# Patient Record
Sex: Female | Born: 1955
Health system: Southern US, Community
[De-identification: ages and names within clinical notes are randomized; demographics above are authoritative.]

## PROBLEM LIST (undated history)

## (undated) DIAGNOSIS — G47419 Narcolepsy without cataplexy: Secondary | ICD-10-CM

## (undated) DIAGNOSIS — M1990 Unspecified osteoarthritis, unspecified site: Secondary | ICD-10-CM

## (undated) DIAGNOSIS — G459 Transient cerebral ischemic attack, unspecified: Secondary | ICD-10-CM

## (undated) DIAGNOSIS — R51 Headache: Secondary | ICD-10-CM

## (undated) DIAGNOSIS — I6529 Occlusion and stenosis of unspecified carotid artery: Secondary | ICD-10-CM

## (undated) DIAGNOSIS — I2699 Other pulmonary embolism without acute cor pulmonale: Secondary | ICD-10-CM

## (undated) DIAGNOSIS — E559 Vitamin D deficiency, unspecified: Secondary | ICD-10-CM

## (undated) DIAGNOSIS — I6521 Occlusion and stenosis of right carotid artery: Secondary | ICD-10-CM

## (undated) DIAGNOSIS — K219 Gastro-esophageal reflux disease without esophagitis: Secondary | ICD-10-CM

## (undated) DIAGNOSIS — G629 Polyneuropathy, unspecified: Secondary | ICD-10-CM

## (undated) DIAGNOSIS — J449 Chronic obstructive pulmonary disease, unspecified: Secondary | ICD-10-CM

## (undated) DIAGNOSIS — G4730 Sleep apnea, unspecified: Secondary | ICD-10-CM

## (undated) DIAGNOSIS — F32A Depression, unspecified: Secondary | ICD-10-CM

## (undated) DIAGNOSIS — M5030 Other cervical disc degeneration, unspecified cervical region: Secondary | ICD-10-CM

## (undated) DIAGNOSIS — F419 Anxiety disorder, unspecified: Secondary | ICD-10-CM

## (undated) DIAGNOSIS — E785 Hyperlipidemia, unspecified: Secondary | ICD-10-CM

## (undated) DIAGNOSIS — Z8709 Personal history of other diseases of the respiratory system: Secondary | ICD-10-CM

## (undated) DIAGNOSIS — F329 Major depressive disorder, single episode, unspecified: Secondary | ICD-10-CM

## (undated) DIAGNOSIS — I2510 Atherosclerotic heart disease of native coronary artery without angina pectoris: Secondary | ICD-10-CM

## (undated) DIAGNOSIS — J45909 Unspecified asthma, uncomplicated: Secondary | ICD-10-CM

## (undated) DIAGNOSIS — R600 Localized edema: Secondary | ICD-10-CM

## (undated) DIAGNOSIS — Z85118 Personal history of other malignant neoplasm of bronchus and lung: Secondary | ICD-10-CM

## (undated) DIAGNOSIS — G8929 Other chronic pain: Secondary | ICD-10-CM

## (undated) DIAGNOSIS — E079 Disorder of thyroid, unspecified: Secondary | ICD-10-CM

## (undated) DIAGNOSIS — E119 Type 2 diabetes mellitus without complications: Secondary | ICD-10-CM

## (undated) DIAGNOSIS — I999 Unspecified disorder of circulatory system: Secondary | ICD-10-CM

## (undated) DIAGNOSIS — R200 Anesthesia of skin: Secondary | ICD-10-CM

## (undated) DIAGNOSIS — R519 Headache, unspecified: Secondary | ICD-10-CM

## (undated) DIAGNOSIS — R202 Paresthesia of skin: Secondary | ICD-10-CM

## (undated) DIAGNOSIS — M5136 Other intervertebral disc degeneration, lumbar region: Secondary | ICD-10-CM

## (undated) DIAGNOSIS — D649 Anemia, unspecified: Secondary | ICD-10-CM

## (undated) DIAGNOSIS — I10 Essential (primary) hypertension: Secondary | ICD-10-CM

## (undated) DIAGNOSIS — I219 Acute myocardial infarction, unspecified: Secondary | ICD-10-CM

## (undated) HISTORY — PX: CARDIAC CATHETERIZATION: SHX172

## (undated) HISTORY — DX: Unspecified asthma, uncomplicated: J45.909

## (undated) HISTORY — PX: LAPAROSCOPIC LYSIS OF ADHESIONS: SHX5905

## (undated) HISTORY — PX: HERNIA REPAIR: SHX51

## (undated) HISTORY — DX: Occlusion and stenosis of right carotid artery: I65.21

## (undated) HISTORY — DX: Type 2 diabetes mellitus without complications: E11.9

## (undated) HISTORY — DX: Anxiety disorder, unspecified: F41.9

## (undated) HISTORY — DX: Disorder of thyroid, unspecified: E07.9

## (undated) HISTORY — PX: TUMOR REMOVAL: SHX12

## (undated) HISTORY — PX: BACK SURGERY: SHX140

## (undated) HISTORY — PX: TONSILLECTOMY: SUR1361

## (undated) HISTORY — PX: KNEE SURGERY: SHX244

## (undated) HISTORY — PX: EYE SURGERY: SHX253

## (undated) HISTORY — PX: ANKLE SURGERY: SHX546

## (undated) HISTORY — DX: Other chronic pain: G89.29

## (undated) HISTORY — PX: CHOLECYSTECTOMY: SHX55

## (undated) HISTORY — PX: TEMPOROMANDIBULAR JOINT SURGERY: SHX35

## (undated) HISTORY — PX: NECK SURGERY: SHX720

## (undated) HISTORY — PX: JOINT REPLACEMENT: SHX530

## (undated) HISTORY — PX: CAROTID ENDARTERECTOMY: SUR193

## (undated) HISTORY — PX: ROTATOR CUFF REPAIR: SHX139

## (undated) HISTORY — PX: LUNG BIOPSY: SHX232

## (undated) HISTORY — PX: NISSEN FUNDOPLICATION: SHX2091

## (undated) HISTORY — PX: BLADDER SURGERY: SHX569

## (undated) HISTORY — PX: FEMUR FRACTURE SURGERY: SHX633

## (undated) HISTORY — DX: Narcolepsy without cataplexy: G47.419

## (undated) HISTORY — PX: TUBAL LIGATION: SHX77

## (undated) HISTORY — PX: CARPAL TUNNEL RELEASE: SHX101

## (undated) HISTORY — PX: ABDOMINAL HYSTERECTOMY: SHX81

## (undated) HISTORY — PX: FOOT SURGERY: SHX648

## (undated) HISTORY — DX: Personal history of other malignant neoplasm of bronchus and lung: Z85.118

---

## 1998-02-18 ENCOUNTER — Emergency Department (HOSPITAL_COMMUNITY): Admission: EM | Admit: 1998-02-18 | Discharge: 1998-02-18 | Payer: Self-pay | Admitting: Emergency Medicine

## 2002-06-26 ENCOUNTER — Ambulatory Visit (HOSPITAL_COMMUNITY): Admission: RE | Admit: 2002-06-26 | Discharge: 2002-06-26 | Payer: Self-pay | Admitting: Internal Medicine

## 2002-07-12 ENCOUNTER — Emergency Department (HOSPITAL_COMMUNITY): Admission: EM | Admit: 2002-07-12 | Discharge: 2002-07-12 | Payer: Self-pay | Admitting: Emergency Medicine

## 2003-03-08 ENCOUNTER — Ambulatory Visit (HOSPITAL_COMMUNITY): Admission: RE | Admit: 2003-03-08 | Discharge: 2003-03-09 | Payer: Self-pay | Admitting: Neurosurgery

## 2003-03-08 ENCOUNTER — Encounter: Payer: Self-pay | Admitting: Neurosurgery

## 2003-04-04 ENCOUNTER — Encounter: Admission: RE | Admit: 2003-04-04 | Discharge: 2003-04-04 | Payer: Self-pay | Admitting: Neurosurgery

## 2003-04-04 ENCOUNTER — Encounter: Payer: Self-pay | Admitting: Neurosurgery

## 2003-05-02 ENCOUNTER — Encounter: Payer: Self-pay | Admitting: Neurosurgery

## 2003-05-02 ENCOUNTER — Encounter: Admission: RE | Admit: 2003-05-02 | Discharge: 2003-05-02 | Payer: Self-pay | Admitting: Neurosurgery

## 2003-08-06 ENCOUNTER — Ambulatory Visit (HOSPITAL_COMMUNITY): Admission: RE | Admit: 2003-08-06 | Discharge: 2003-08-06 | Payer: Self-pay | Admitting: Neurosurgery

## 2004-01-23 ENCOUNTER — Ambulatory Visit (HOSPITAL_BASED_OUTPATIENT_CLINIC_OR_DEPARTMENT_OTHER): Admission: RE | Admit: 2004-01-23 | Discharge: 2004-01-23 | Payer: Self-pay | Admitting: Orthopedic Surgery

## 2004-07-24 ENCOUNTER — Ambulatory Visit: Payer: Self-pay | Admitting: Cardiology

## 2004-07-24 ENCOUNTER — Ambulatory Visit (HOSPITAL_COMMUNITY): Admission: RE | Admit: 2004-07-24 | Discharge: 2004-07-24 | Payer: Self-pay | Admitting: Cardiology

## 2006-01-08 ENCOUNTER — Ambulatory Visit: Payer: Self-pay | Admitting: Oncology

## 2007-11-08 ENCOUNTER — Observation Stay (HOSPITAL_COMMUNITY): Admission: RE | Admit: 2007-11-08 | Discharge: 2007-11-09 | Payer: Self-pay | Admitting: Orthopedic Surgery

## 2007-12-27 ENCOUNTER — Encounter: Admission: RE | Admit: 2007-12-27 | Discharge: 2008-02-22 | Payer: Self-pay | Admitting: Orthopedic Surgery

## 2007-12-29 ENCOUNTER — Encounter: Admission: RE | Admit: 2007-12-29 | Discharge: 2007-12-29 | Payer: Self-pay | Admitting: Orthopaedic Surgery

## 2008-02-28 ENCOUNTER — Ambulatory Visit: Payer: Self-pay | Admitting: Cardiovascular Disease

## 2008-02-28 LAB — CONVERTED CEMR LAB
BUN: 13 mg/dL (ref 6–23)
Basophils Absolute: 0 10*3/uL (ref 0.0–0.1)
Basophils Relative: 0 % (ref 0.0–3.0)
CO2: 29 meq/L (ref 19–32)
Calcium: 9.6 mg/dL (ref 8.4–10.5)
Chloride: 103 meq/L (ref 96–112)
Creatinine, Ser: 0.6 mg/dL (ref 0.4–1.2)
Eosinophils Absolute: 0.2 10*3/uL (ref 0.0–0.7)
Eosinophils Relative: 1.8 % (ref 0.0–5.0)
GFR calc Af Amer: 136 mL/min
GFR calc non Af Amer: 112 mL/min
Glucose, Bld: 95 mg/dL (ref 70–99)
HCT: 44.1 % (ref 36.0–46.0)
Hemoglobin: 15.6 g/dL — ABNORMAL HIGH (ref 12.0–15.0)
INR: 0.9 (ref 0.8–1.0)
Lymphocytes Relative: 20.7 % (ref 12.0–46.0)
MCHC: 35.3 g/dL (ref 30.0–36.0)
MCV: 100 fL (ref 78.0–100.0)
Monocytes Absolute: 0.7 10*3/uL (ref 0.1–1.0)
Monocytes Relative: 6.1 % (ref 3.0–12.0)
Neutro Abs: 8 10*3/uL — ABNORMAL HIGH (ref 1.4–7.7)
Neutrophils Relative %: 71.4 % (ref 43.0–77.0)
Platelets: 210 10*3/uL (ref 150–400)
Potassium: 3.8 meq/L (ref 3.5–5.1)
Prothrombin Time: 11 s (ref 10.9–13.3)
RBC: 4.41 M/uL (ref 3.87–5.11)
RDW: 13.4 % (ref 11.5–14.6)
Sodium: 138 meq/L (ref 135–145)
WBC: 11.2 10*3/uL — ABNORMAL HIGH (ref 4.5–10.5)

## 2008-03-02 ENCOUNTER — Inpatient Hospital Stay (HOSPITAL_BASED_OUTPATIENT_CLINIC_OR_DEPARTMENT_OTHER): Admission: RE | Admit: 2008-03-02 | Discharge: 2008-03-02 | Payer: Self-pay | Admitting: Cardiovascular Disease

## 2008-03-02 ENCOUNTER — Ambulatory Visit: Payer: Self-pay | Admitting: Cardiovascular Disease

## 2008-04-04 ENCOUNTER — Ambulatory Visit: Payer: Self-pay | Admitting: Cardiovascular Disease

## 2008-08-21 ENCOUNTER — Ambulatory Visit: Payer: Self-pay | Admitting: Thoracic Surgery

## 2008-09-07 ENCOUNTER — Inpatient Hospital Stay (HOSPITAL_COMMUNITY): Admission: RE | Admit: 2008-09-07 | Discharge: 2008-09-14 | Payer: Self-pay | Admitting: Thoracic Surgery

## 2008-09-07 ENCOUNTER — Ambulatory Visit: Payer: Self-pay | Admitting: Thoracic Surgery

## 2008-09-07 ENCOUNTER — Ambulatory Visit: Payer: Self-pay | Admitting: Pulmonary Disease

## 2008-09-07 ENCOUNTER — Encounter: Payer: Self-pay | Admitting: Thoracic Surgery

## 2008-09-18 ENCOUNTER — Ambulatory Visit: Payer: Self-pay | Admitting: Thoracic Surgery

## 2008-09-18 ENCOUNTER — Encounter: Admission: RE | Admit: 2008-09-18 | Discharge: 2008-09-18 | Payer: Self-pay | Admitting: Thoracic Surgery

## 2008-10-02 ENCOUNTER — Ambulatory Visit: Payer: Self-pay | Admitting: Thoracic Surgery

## 2008-10-02 ENCOUNTER — Encounter: Admission: RE | Admit: 2008-10-02 | Discharge: 2008-10-02 | Payer: Self-pay | Admitting: Thoracic Surgery

## 2008-11-13 ENCOUNTER — Ambulatory Visit: Payer: Self-pay | Admitting: Thoracic Surgery

## 2008-11-13 ENCOUNTER — Encounter: Admission: RE | Admit: 2008-11-13 | Discharge: 2008-11-13 | Payer: Self-pay | Admitting: Thoracic Surgery

## 2009-03-08 ENCOUNTER — Ambulatory Visit: Payer: Self-pay | Admitting: Thoracic Surgery

## 2009-03-27 ENCOUNTER — Encounter (INDEPENDENT_AMBULATORY_CARE_PROVIDER_SITE_OTHER): Payer: Self-pay | Admitting: *Deleted

## 2009-07-08 ENCOUNTER — Encounter: Admission: RE | Admit: 2009-07-08 | Discharge: 2009-07-08 | Payer: Self-pay | Admitting: Orthopaedic Surgery

## 2009-10-23 HISTORY — PX: COLONOSCOPY: SHX174

## 2009-10-25 ENCOUNTER — Ambulatory Visit (HOSPITAL_BASED_OUTPATIENT_CLINIC_OR_DEPARTMENT_OTHER): Admission: RE | Admit: 2009-10-25 | Discharge: 2009-10-25 | Payer: Self-pay | Admitting: Orthopedic Surgery

## 2009-12-04 ENCOUNTER — Encounter: Admission: RE | Admit: 2009-12-04 | Discharge: 2009-12-04 | Payer: Self-pay | Admitting: Orthopedic Surgery

## 2009-12-05 ENCOUNTER — Ambulatory Visit (HOSPITAL_BASED_OUTPATIENT_CLINIC_OR_DEPARTMENT_OTHER): Admission: RE | Admit: 2009-12-05 | Discharge: 2009-12-05 | Payer: Self-pay | Admitting: Orthopedic Surgery

## 2010-01-21 ENCOUNTER — Encounter: Admission: RE | Admit: 2010-01-21 | Discharge: 2010-01-21 | Payer: Self-pay | Admitting: Orthopaedic Surgery

## 2010-01-24 ENCOUNTER — Encounter: Admission: RE | Admit: 2010-01-24 | Discharge: 2010-01-24 | Payer: Self-pay | Admitting: Orthopaedic Surgery

## 2010-03-30 IMAGING — CR DG CHEST 2V
2 series · 2 of 2 positions shown · non-contrast
Comparison: 11/03/2007

CLINICAL DATA: Chest pain, shortness of breath of

CHEST - 2 VIEW

[view not recorded (1 of 2)]
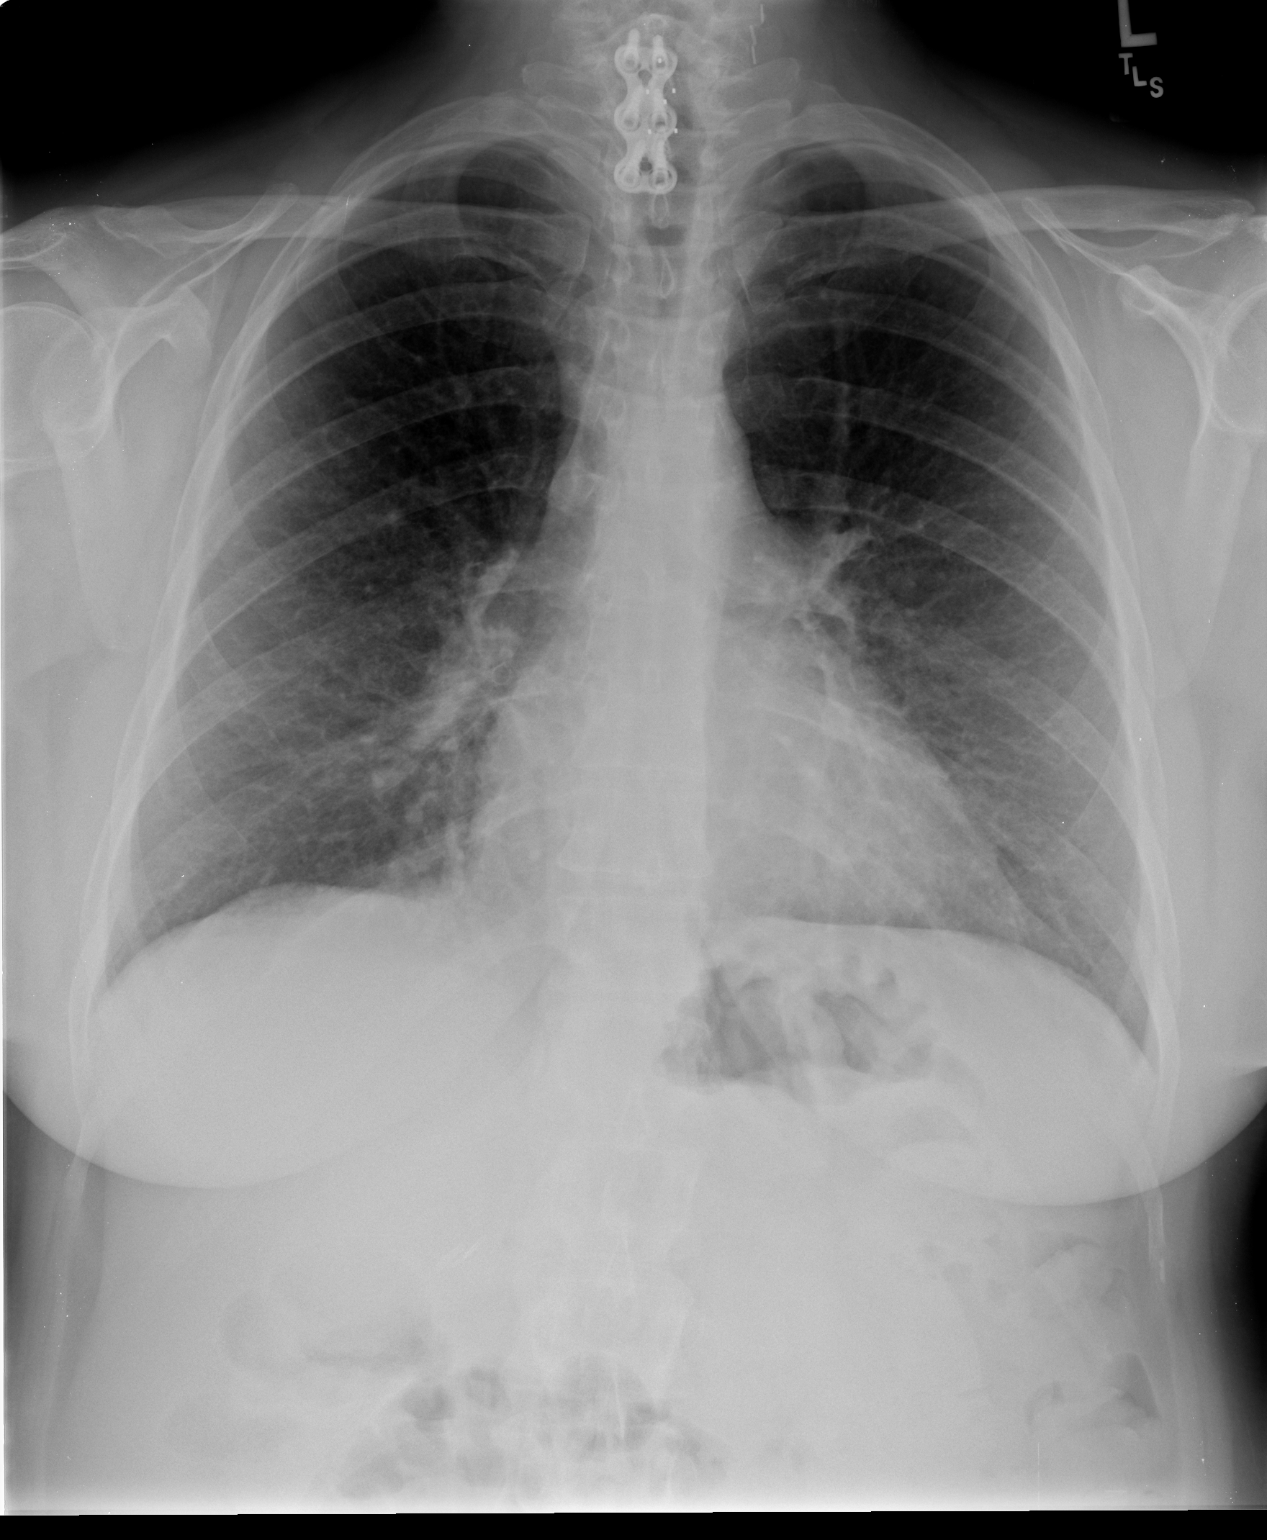

[view not recorded (2 of 2)]
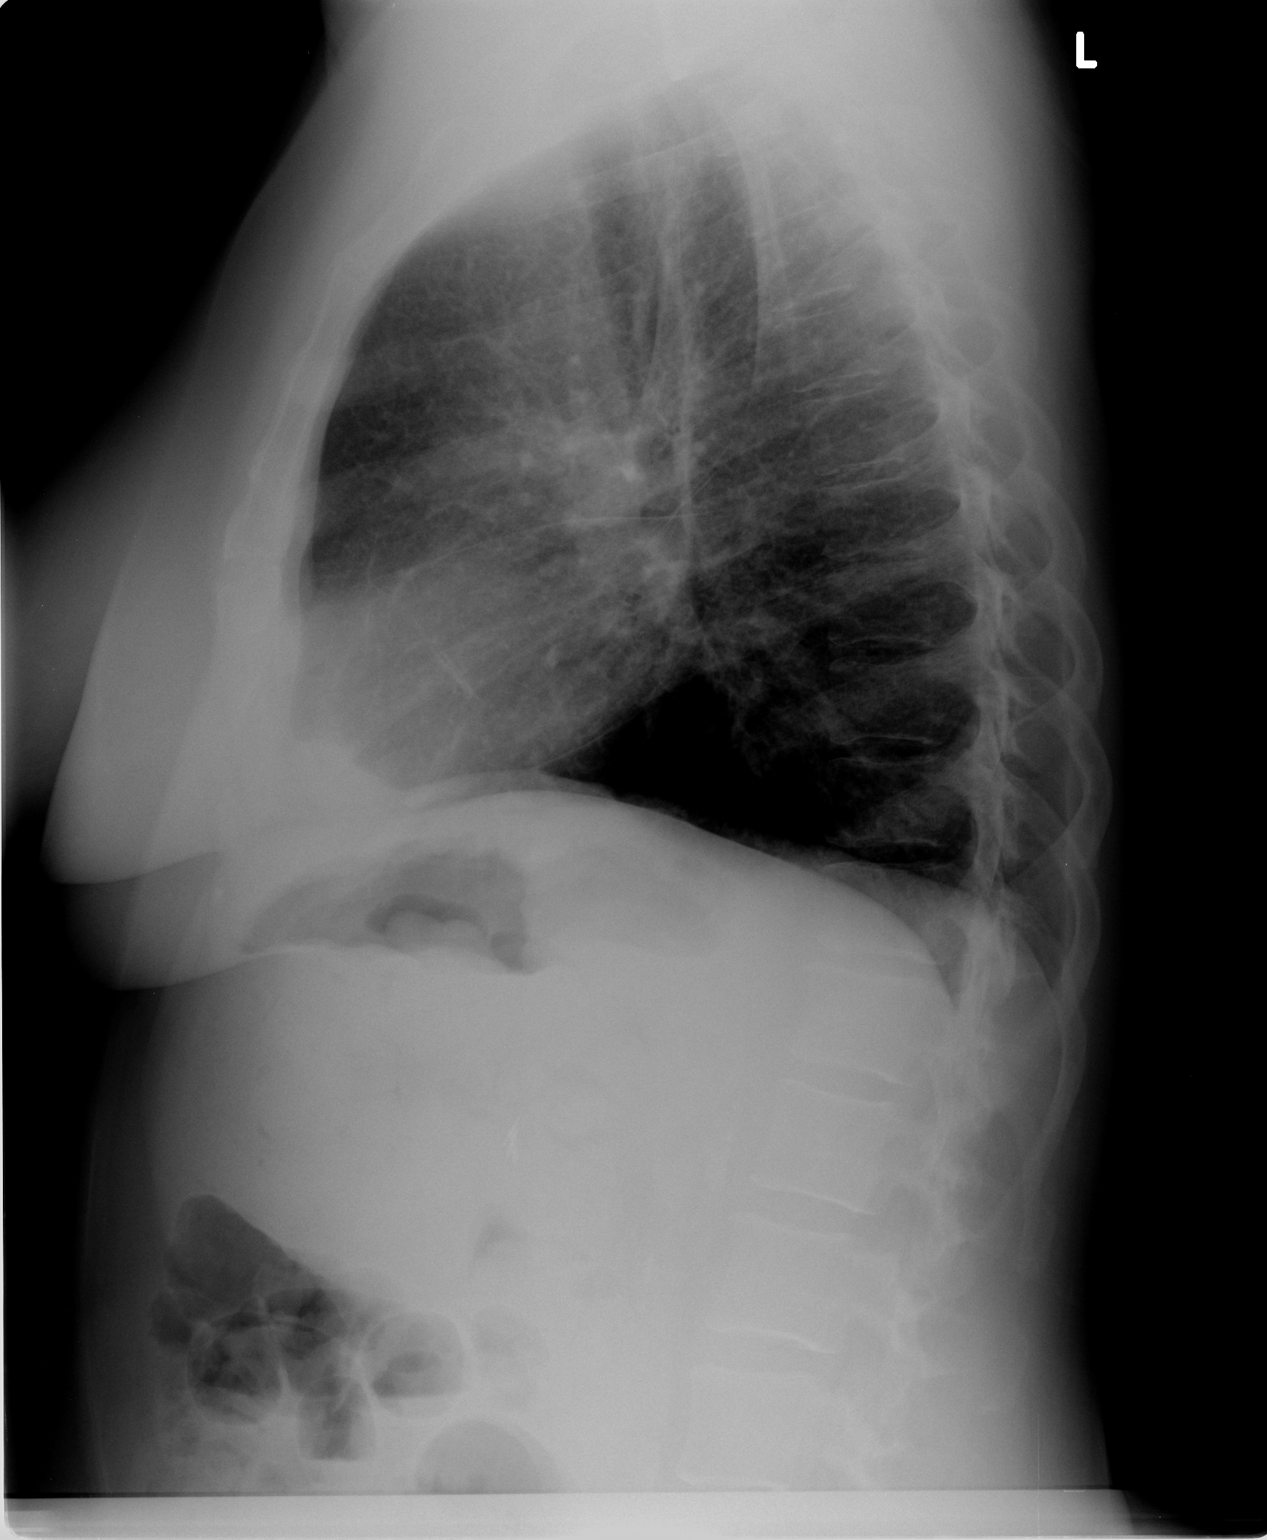

[2 of 2 positions shown; findings below may reference images not displayed]

FINDINGS: Coarse perihilar and bibasilar interstitial markings
largely stable.  No confluent airspace infiltrate.  No effusion.
Vascular clips in the right upper abdomen.  Cervical plate and
graft markers noted.  Heart size upper limits normal.  Visualized
bones unremarkable.
IMPRESSION: Stable interstitial lung disease without acute or superimposed
abnormality.

## 2010-10-28 LAB — POCT I-STAT, CHEM 8
Chloride: 105 mEq/L (ref 96–112)
HCT: 45 % (ref 36.0–46.0)
Hemoglobin: 15.3 g/dL — ABNORMAL HIGH (ref 12.0–15.0)
Potassium: 3.8 mEq/L (ref 3.5–5.1)
Sodium: 143 mEq/L (ref 135–145)

## 2010-10-28 LAB — GLUCOSE, CAPILLARY
Glucose-Capillary: 101 mg/dL — ABNORMAL HIGH (ref 70–99)
Glucose-Capillary: 102 mg/dL — ABNORMAL HIGH (ref 70–99)

## 2010-11-02 LAB — BASIC METABOLIC PANEL
BUN: 10 mg/dL (ref 6–23)
Calcium: 9.1 mg/dL (ref 8.4–10.5)
Chloride: 103 mEq/L (ref 96–112)
Creatinine, Ser: 0.74 mg/dL (ref 0.4–1.2)
GFR calc non Af Amer: >60 mL/min (ref >60)

## 2010-11-02 LAB — GLUCOSE, CAPILLARY: Glucose-Capillary: 97 mg/dL (ref 70–99)

## 2010-11-24 LAB — BASIC METABOLIC PANEL
CO2: 24 mEq/L (ref 19–32)
Chloride: 98 mEq/L (ref 96–112)
Creatinine, Ser: 0.49 mg/dL (ref 0.4–1.2)
GFR calc Af Amer: >60 mL/min (ref >60)
Potassium: 4.4 mEq/L (ref 3.5–5.1)

## 2010-11-24 LAB — GLUCOSE, CAPILLARY
Glucose-Capillary: 103 mg/dL — ABNORMAL HIGH (ref 70–99)
Glucose-Capillary: 120 mg/dL — ABNORMAL HIGH (ref 70–99)
Glucose-Capillary: 134 mg/dL — ABNORMAL HIGH (ref 70–99)
Glucose-Capillary: 143 mg/dL — ABNORMAL HIGH (ref 70–99)
Glucose-Capillary: 195 mg/dL — ABNORMAL HIGH (ref 70–99)

## 2010-11-24 LAB — CBC
HCT: 37.4 % (ref 36.0–46.0)
HCT: 43.8 % (ref 36.0–46.0)
Hemoglobin: 12.5 g/dL (ref 12.0–15.0)
MCHC: 33.4 g/dL (ref 30.0–36.0)
MCHC: 33.8 g/dL (ref 30.0–36.0)
MCV: 100 fL (ref 78.0–100.0)
MCV: 99.1 fL (ref 78.0–100.0)
Platelets: 169 10*3/uL (ref 150–400)
Platelets: 171 10*3/uL (ref 150–400)
RBC: 3.58 MIL/uL — ABNORMAL LOW (ref 3.87–5.11)
RBC: 3.74 MIL/uL — ABNORMAL LOW (ref 3.87–5.11)
WBC: 15.8 10*3/uL — ABNORMAL HIGH (ref 4.0–10.5)
WBC: 6.7 10*3/uL (ref 4.0–10.5)

## 2010-11-24 LAB — BLOOD GAS, ARTERIAL
Acid-Base Excess: 0.1 mmol/L (ref 0.0–2.0)
O2 Saturation: 90.5 %
TCO2: 25.1 mmol/L (ref 0–100)
pCO2 arterial: 36.9 mmHg (ref 35.0–45.0)

## 2010-11-24 LAB — URINE MICROSCOPIC-ADD ON

## 2010-11-24 LAB — TYPE AND SCREEN: ABO/RH(D): A POS

## 2010-11-24 LAB — FUNGUS CULTURE W SMEAR: Fungal Smear: NONE SEEN

## 2010-11-24 LAB — COMPREHENSIVE METABOLIC PANEL
ALT: 23 U/L (ref 0–35)
AST: 28 U/L (ref 0–37)
Albumin: 3 g/dL — ABNORMAL LOW (ref 3.5–5.2)
Albumin: 3.3 g/dL — ABNORMAL LOW (ref 3.5–5.2)
BUN: 9 mg/dL (ref 6–23)
CO2: 21 mEq/L (ref 19–32)
CO2: 28 mEq/L (ref 19–32)
Chloride: 100 mEq/L (ref 96–112)
Chloride: 104 mEq/L (ref 96–112)
Creatinine, Ser: 0.63 mg/dL (ref 0.4–1.2)
GFR calc Af Amer: >60 mL/min (ref >60)
GFR calc non Af Amer: >60 mL/min (ref >60)
GFR calc non Af Amer: >60 mL/min (ref >60)
Potassium: 4.6 mEq/L (ref 3.5–5.1)
Sodium: 138 mEq/L (ref 135–145)
Total Bilirubin: 0.3 mg/dL (ref 0.3–1.2)
Total Bilirubin: 0.7 mg/dL (ref 0.3–1.2)

## 2010-11-24 LAB — ABO/RH: ABO/RH(D): A POS

## 2010-11-24 LAB — AFB CULTURE WITH SMEAR (NOT AT ARMC)

## 2010-11-24 LAB — URINALYSIS, ROUTINE W REFLEX MICROSCOPIC
Bilirubin Urine: NEGATIVE
Ketones, ur: NEGATIVE mg/dL
Nitrite: NEGATIVE
Urobilinogen, UA: 1 mg/dL (ref 0.0–1.0)

## 2010-11-24 LAB — APTT: aPTT: 31 seconds (ref 24–37)

## 2010-11-24 LAB — TISSUE CULTURE

## 2010-11-24 LAB — PROTIME-INR: INR: 1 (ref 0.00–1.49)

## 2010-11-25 LAB — GLUCOSE, CAPILLARY
Glucose-Capillary: 101 mg/dL — ABNORMAL HIGH (ref 70–99)
Glucose-Capillary: 108 mg/dL — ABNORMAL HIGH (ref 70–99)
Glucose-Capillary: 117 mg/dL — ABNORMAL HIGH (ref 70–99)
Glucose-Capillary: 120 mg/dL — ABNORMAL HIGH (ref 70–99)
Glucose-Capillary: 122 mg/dL — ABNORMAL HIGH (ref 70–99)
Glucose-Capillary: 127 mg/dL — ABNORMAL HIGH (ref 70–99)
Glucose-Capillary: 213 mg/dL — ABNORMAL HIGH (ref 70–99)
Glucose-Capillary: 90 mg/dL (ref 70–99)

## 2010-11-25 LAB — COMPREHENSIVE METABOLIC PANEL
AST: 57 U/L — ABNORMAL HIGH (ref 0–37)
Albumin: 2.7 g/dL — ABNORMAL LOW (ref 3.5–5.2)
Calcium: 8.9 mg/dL (ref 8.4–10.5)
Creatinine, Ser: 0.53 mg/dL (ref 0.4–1.2)
GFR calc Af Amer: >60 mL/min (ref >60)
GFR calc non Af Amer: >60 mL/min (ref >60)

## 2010-11-25 LAB — CBC
HCT: 36.4 % (ref 36.0–46.0)
Hemoglobin: 12.1 g/dL (ref 12.0–15.0)
MCHC: 33.3 g/dL (ref 30.0–36.0)
MCHC: 34.3 g/dL (ref 30.0–36.0)
MCV: 100.8 fL — ABNORMAL HIGH (ref 78.0–100.0)
MCV: 98.9 fL (ref 78.0–100.0)
MCV: 99.6 fL (ref 78.0–100.0)
Platelets: 179 10*3/uL (ref 150–400)
Platelets: 217 10*3/uL (ref 150–400)
Platelets: 348 10*3/uL (ref 150–400)
RDW: 13.3 % (ref 11.5–15.5)
RDW: 13.5 % (ref 11.5–15.5)
WBC: 11.4 10*3/uL — ABNORMAL HIGH (ref 4.0–10.5)

## 2010-12-23 NOTE — Assessment & Plan Note (Signed)
OFFICE VISIT   Barbara French, Barbara French  DOB:  Nov 19, 1955                                        October 02, 2008  CHART #:  [id number]   The patient returned today for followup of her lung biopsy.  Her  incisions are healing.  She is still having a moderate amount of pain at  night and is taking pain medication.  Her blood pressure is 120/62,  pulse 85, respirations 18, and sats are 95% and she is off oxygen.  Overall, she is doing much better.  I will plan to see her back again in  6 weeks with a chest x-ray for a final check.   Ines Bloomer, M.D.  Electronically Signed   DPB/MEDQ  D:  10/02/2008  T:  10/02/2008  Job:  [id number]

## 2010-12-23 NOTE — Discharge Summary (Signed)
Barbara French, Barbara French                ACCOUNT NO.:  [id number]   MEDICAL RECORD NO.:  [id number]          PATIENT TYPE:  INP   LOCATION:  2030                         FACILITY:  [facility]   PHYSICIAN:  Ines Bloomer, M.D. DATE OF BIRTH:  July 01, 1956   DATE OF ADMISSION:  09/07/2008  DATE OF DISCHARGE:                               DISCHARGE SUMMARY   FINAL DIAGNOSIS:  Bilateral pulmonary infiltrate, probable interstitial  residual lung disease, awaiting final pathology report.   SECONDARY DIAGNOSES:  1. History of myocardial infarction.  2. Peripheral vascular disease.  3. Sleep apnea.  4. History of Raynaud's.  5. Hypertension.  6. Hypercholesterolemia.  7. Chronic obstructive pulmonary disease.  8. Anxiety/depression.   IN-HOSPITAL OPERATIONS AND PROCEDURES:  Left video-assisted  thoracoscopic surgery with lung biopsy x2.   THE PATIENT'S HISTORY AND PHYSICAL AND HOSPITAL COURSE:  The patient is  a 55 year old female who has had progressive shortness of breath and  underwent bronchoscopy by Dr. Maren Reamer, which was nondiagnostic.  Pulmonary function tests done showed an FVC that was normal position but  diffusion capacity of 43% that corrected to 48%.  The patient does have  a history of myocardial infarction, as well as COPD, hypertension,  hyperlipidemia.  She underwent cardiac workup preoperatively, which was  negative.  The patient continues with progressive shortness of breath at  rest and with exertion.  She denies fever, chills, or excessive sputum.  She was referred to Dr. Edwyna Shell to undergo possible lung biopsy for  definitive diagnosis.  The patient was seen and evaluated by Dr. Edwyna Shell.  Dr. Edwyna Shell discussed with the patient undergoing left VATS with lung  biopsy.  He discussed risks and benefits with the patient.  The patient  acknowledged understanding and agreed to proceed.  Surgery was scheduled  for September 07, 2008.  For details of the patient's past medical  history  and physical exam, please see dictated H&P.   The patient was taken to the operating room on September 07, 2008, where  she underwent left video-assisted thoracoscopic surgery with lung biopsy  x2.  The patient tolerated this procedure well and was transferred to  the intensive care unit in stable condition.  Postoperatively, the  patient was noted to be hemodynamically stable.  She was extubated  following surgery.  Post extubation, she is noted to be alert and  oriented x4 and neuro intact.  Currently, the patient's final pathology  report is still pending.  The patient's postoperative course appeared  much unremarkable.  Pulmonary was consulted for assistance.  Recommended  continue nebulizers and inhalers, reinforced smoking cessation.  They  did follow during the patient's hospital course.  Daily chest x-rays  obtained postoperatively.  No air leak noted from the patient's chest  tube.  Chest x-rays remained stable.  The chest tube was discontinued in  normal fashion.  Followup chest x-ray remained stable.  Postoperatively,  the patient continued to work on her incentive spirometer.  She remains  on oxygen 3-4 liters, maintaining O2 sats 90%.  Postoperatively, the  patient's vital signs were monitored and remained stable.  She remained  afebrile.  Blood pressure stable.  She is in normal sinus rhythm.  Lisinopril was held secondary to blood pressure remaining stable and  persistent cough.  Postoperatively, the patient was out of bed,  ambulating well without difficulty.  She was tolerating diet well.  No  nausea or vomiting noted.  All incisions were noted to be clean, dry,  and intact and healing well.   On September 11, 2008, the patient was noted to be afebrile.  She was in  normal sinus rhythm.  Blood pressure stable.  She is sating 92% on 3  liters.  Most recent lab work showed a white count of 11.4, hemoglobin  of 11.9, hematocrit 34.9, platelet count of 217.  Sodium of  138,  potassium 4.6, chloride of 104, bicarbonate 28, BUN of 5, creatinine  0.59, glucose of 191.  All cultures noted to be negative.  The patient  is tentatively ready for discharge home in the a.m. on September 12, 2008.   FOLLOWUP APPOINTMENTS:  A followup appointment has been arranged with  Dr. Edwyna Shell for September 18, 2008, at 2:30 p.m.  The patient will need to  obtain PA and lateral chest x-ray 30 minutes prior to this appointment.   ACTIVITY:  The patient instructed no driving until released to do so, no  heavy lifting over 10 pounds.  She is told to ambulate 3-4 times per  day, progress as tolerated, and continue her breathing exercises.   INCISIONAL CARE:  The patient is told to shower, washing her incisions  using soap and water.  She is to contact the office if she develops any  drainage or opening from any of her incision site.   DIET:  The patient is educated on diet to be low fat, low salt.   DISCHARGE MEDICATIONS:  1. Lovastatin 80 mg at night.  2. Plavix 75 mg daily.  3. Calcium 500 mg daily.  4. Ventolin HFA inhaler p.r.n.  5. Symbicort 160/4.5 inhaler p.r.n.  6. Zoloft 50 mg p.m.  7. Chantix 1 mg b.i.d.  8. Norvasc 5 mg daily.  9. Lyrica 150 mg b.i.d.  10.Prilosec b.i.d.  11.Singulair 10 mg at night.  12.Albuterol inhaler 2 puffs b.i.d.  13.Nasal spray p.r.n.  14.Potassium chloride 30 mEq daily.  15.MiraLax p.r.n.  16.Clonidine HCl 0.2 mg p.m.  17.Aspirin 325 mg daily.  18.NitroQuick 0.4 mg p.r.n.  19.Flexeril 20 mg p.m.  20.Trazodone 100 mg p.r.n. at night.  21.Advair 250/50 one puff b.i.d.  22.Percocet 5/325 one to two tablets q.4-6 h. P.r.n.        Theda Belfast, [place]      Ines Bloomer, M.D.  Electronically Signed    KMD/MEDQ  D:  09/11/2008  T:  09/12/2008  Job:  [id number]

## 2010-12-23 NOTE — Letter (Signed)
August 21, 2008   Tanvir A. Blenda Nicely, MD  [address]  [place], [place] [address]   Re:  Barbara French, Barbara French                DOB:  10-22-1955   Dear Dr. Blenda Nicely:   I saw the patient in the office today.  This 55 year old patient has had  progressive shortness of breath recently and underwent a bronchoscopy,  which was nondiagnostic.  Her pulmonary function tests are essentially  normal except she has a diffusion capacity of only 43% and that corrects  to 48%.  She has had a cardiac workup, which was apparently negative and  she gets progressive shortness of breath with any type of exertion.  She  has had no fever, chills, excessive sputum, and has had some mild weight  gain.   MEDICATIONS:  Advair Diskus one twice a day, Proventil HFA, Xopenex,  Singulair, prednisone 10 mg a day, Nasonex, Lexapro 20 mg a day,  fexofenadine 180 mg a day, clonidine 0.2 mg daily, Aciphex 20 mg daily,  Benicar 40/12.5 one daily, furosemide 20 mg daily, and potassium.  She  also takes Plavix 75 mg a day as well as aspirin and potassium  supplement.   PAST MEDICAL HISTORY:  She has had a history of a myocardial infarction  and evidence of peripheral vascular disease.  She also was told she have  ulcerative colitis, sleep apnea, and Raynaud phenomenon.  She has  hypertension also.  Apparently, she does not have hypercholesterolemia.   FAMILY HISTORY:  Positive for coronary artery disease, chronic  obstructive pulmonary disease, diabetes mellitus, and breast cancer.   SOCIAL HISTORY:  She is single, has 3 children.  She had quit smoking  several days ago.  Does has 1-3 alcoholic drinks per night.   REVIEW OF SYSTEMS:  Vital Signs:  She is 177 pounds.  She is 5 feet 2  inches.  General:  She has had some weight gain.  Cardiac:  She has  shortness of breath with exertion.  She has had some chest tightness.  No atrial fibrillation.  Pulmonary:  She has had a cough, bronchitis, an  episode of hemoptysis, and  has been wheezing.  GI:  She has had some  dysphagia, abdominal pain, and constipation.  GU:  She has had some  frequent urination.  Vascular:  She has had some claudication.  She had  a TIA twice and a history of phlebitis twice.  Neurologic:  She has had  dizziness and headaches.  No seizures or blackout.  Musculoskeletal:  She has joint pain and muscular pain.  Psychiatric:  She has been  treated for depression or nervousness.  Eye/ENT:  No change in her  eyesight or hearing.  Hematological:  No problems with bleeding,  clotting disorders, or anemia, although she is on Plavix.   PHYSICAL EXAMINATION:  GENERAL:  She is a slightly obese Caucasian  female, in no acute distress.  HEAD, EYES, EARS, NOSE AND THROAT:  Unremarkable.  NECK:  Supple without thyromegaly.  There is no supraclavicular or  axillary adenopathy.  CHEST:  Clear to auscultation and percussion.  HEART:  Regular sinus rhythm.  No murmurs.  I did not hear any wheezes.  ABDOMEN:  Obese.  Bowel sounds are normal.  There is no  hepatosplenomegaly.  EXTREMITIES:  Pulses are 2+.  There is 1+ edema.  No clubbing.  NEUROLOGICAL:  She is oriented x3.  Sensory and motor intact.  Cranial  nerves intact.   IMPRESSION:  I discussed the situation with the patient and recommended  that she had a left video-assisted thoracoscopy and lung biopsy.  We  will plan to do this on September 06, 2008, at [facility].  I have  discussed the risks of procedure and she agrees to surgery.  I  appreciate the opportunity of seeing the patient.   Sincerely.   Ines Bloomer, M.D.  Electronically Signed   DPB/MEDQ  D:  08/21/2008  T:  08/22/2008  Job:  [id number]

## 2010-12-23 NOTE — H&P (Signed)
Barbara French, Barbara French                ACCOUNT NO.:  [id number]   MEDICAL RECORD NO.:  [id number]          PATIENT TYPE:  INP   LOCATION:  NA                           FACILITY:  [facility]   PHYSICIAN:  Ines Bloomer, M.D. DATE OF BIRTH:  22-Jul-1956   DATE OF ADMISSION:  DATE OF DISCHARGE:                              HISTORY & PHYSICAL   CHIEF COMPLAINT:  Shortness of breath.   This 55 year old  patient has had progressive shortness of breath,  underwent bronchoscopy by Dr. Esther Hardy, which was nondiagnostic.  Had  pulmonary function tests done, showed an FVC that was normal but a  diffusion capacity of 43% that corrected to 48%.  Her cardiac workup was  negative.  She gets progressive shortness of breath with any type of  exertion.  She has had no fever, chills, excessive sputum, some mild  hemoptysis .   MEDICATIONS:  Her medications include:  1. Advair Diskus twice daily.  2. Proventil HFA.  3. Xopenex.  4. Singulair.  5. Prednisone 10 mg daily.  6. Nasonex.  7. Lexapro.  8. Fexofenadine 180 mg daily.  9. Clonidine 0.2 daily.  10.Aciphex 20 mg daily.  11.Benicar 40/12.5 one daily.  12.Furosemide 20 daily.  13.Potassium supplement.  14.She also takes Plavix 75 mg a day, aspirin.   PAST MEDICAL HISTORY:  She has a history of myocardial infarction,  peripheral vascular disease, sleep apnea. Raynaud's phenomena,  hypertension and hypercholesterolemia.   FAMILY HISTORY:  Is positive for chronic obstructive pulmonary disease,  diabetes mellitus, breast cancer and coronary artery disease.   SOCIAL HISTORY:  She is single and has three children.  Quit smoking  about 2 weeks ago.  Has one to three alcohol drinks per night.   REVIEW OF SYSTEMS:  Weight is 177 pounds.  She is 5 feet 2 inches.  She  has had some recent weight gain.  CARDIAC:  See history of present  illness and past medical history.  No atrial fibrillation.  PULMONARY:  Has had some bronchitis and an episode of  hemoptysis and then some  wheezing.  GI:  Dysphagia, abdominal pain, constipation.  GU:  Frequent  urination.  VASCULAR:  Claudication, two TIAs and a history of  phlebitis.  NEUROLOGICAL:  She has had some dizziness and headaches, no  seizures or blackouts.  MUSCULOSKELETAL:  Joint pain and muscular pain.  PSYCHIATRIC:  She has been treated for depression and nervousness.  EYES/ENT:  No change in her eyesight or hearing.  HEMATOLOGICAL:  No  problems with bleeding, clotting disorders.  She is pn Plavix.   PHYSICAL EXAMINATION:  She is a slightly obese Caucasian female in no  acute distress.  Blood pressure is 124/64, pulse 96, respirations 22.  Saturations were 95%.  HEAD:  Is atraumatic.  EYES: Pupils equal and reactive to light and  accommodation.  Extraocular movements normal.  EARS: Tympanic membranes  are intact.  NOSE:  There is no septal deviation.  NECK:  Is supple without thyromegaly.  There is no supraclavicular  axillary adenopathy.  CHEST:  There are some bilateral wheezes.  HEART:  Regular sinus rhythm, no murmurs.  ABDOMEN:  Soft.  There is no hepatosplenomegaly.  EXTREMITIES:  Pulses are 2+no edema, clubbing.  NEUROLOGICAL:  She is oriented x3.  Sensory and motor intact.  Cranial  nerves intact.   IMPRESSION:  1. Progressive dyspnea with probable interstitial lung disease.  2. Hypertension.  3. Hypercholesterolemia.  4. History of TIAs.  5,.  History of phlebitis.  6,  History of tobacco abuse.   PLAN:  Left VATS lung biopsy x2.      Ines Bloomer, M.D.  Electronically Signed     DPB/MEDQ  D:  09/06/2008  T:  09/06/2008  Job:  [id number]

## 2010-12-23 NOTE — Letter (Signed)
September 18, 2008   Tanvir A. Blenda Nicely, MD  [address]  [place], [place] [address]   Re:  BAYLEY, YARBOROUGH                DOB:  [birth year]   Dear Dr. Blenda Nicely:   The patient came for followup of her lung biopsy.  We removed her chest  tube sutures.  Her incisions are healing well.  Her biopsy showed severe  case of respiratory bronchiolitis.  She is started on steroids and told  to refrain from smoking.  We sent her home on home oxygen.  Her sats  today were 98% on 3 liters.  Her blood pressure was 100/60, pulse 83,  and respirations 18.  I told her she can start driving today.  We  reduced her oxygen to 2 liters.  She will apparently call you to see  about reducing her steroids.  Chest x-ray today showed that  postoperative change in her left lower lobe, but the rest of her lung  appeared to be stable.  I will plan to see her back in 2 weeks with a  chest x-ray.   Ines Bloomer, M.D.  Electronically Signed   DPB/MEDQ  D:  09/18/2008  T:  09/18/2008  Job:  [id number]

## 2010-12-23 NOTE — Assessment & Plan Note (Signed)
[facility]                            CARDIOLOGY OFFICE NOTE   NAME:Barbara French, Barbara French                       MRN:          [id number]  DATE:02/28/2008                            DOB:          Aug 23, 1955    Mr. Butkiewicz is a 55 year old patient who previously had been seen by Dr.  Andee Lineman.   She is referred by her pulmonologist, Dr. Blenda Nicely for question pulmonary  hypertension.  The patient is a chronic smoker.  She has over a 35-pack-  year history.  She has been having increasing dyspnea.  She has been  having some sharp chest pain as well.  She had a cath in December 2005,  which showed some myocardial bridging and a 70% lesion in diagonal  branch, which was thought to be small and best treated with medication.   She has had a 2-D echocardiogram this June.  Her estimated PA systolic  pressure at least by echo was only 28.   However, because of her persistent dyspnea Dr. Blenda Nicely wanted her  referred for right heart cath to assess PA pressures.   In talking to the patient, she has actually developed significant  vascular disease since 2005.  She has had a left carotid endarterectomy  in [facility].  She also says that she has Raynaud disease and has had  some pain in her legs which sounds like claudication.   Unfortunately, I do not have any of these records in regards to her  lungs.  She apparently has had sleep apnea.  She has been intolerant to  CPAP in the past.  She has tried Chantix and Wellbutrin to stop smoking  without any effect.  She has down to half pack a day reading through Dr.  Feliz Beam notes that appears that he has counseled her quite a bit  regarding the long-term risks and tried fairly vigorously to get her to  stop smoking.   The patient's chest pain is atypical.  It is sharp.  It cannot be  exertional, but it can be at rest and has a minor pleuritic component.  There is no cough, no fever, no sputum production.  She denies  history  of abnormal chest x-ray.  She says she has had this pain ever since her  last heart cath.   It is not particularly worsen.   REVIEW OF SYSTEMS:  Otherwise negative.   PAST MEDICAL HISTORY:  Remarkable for multiple cervical effusions,  previous left CEA in 2008 in [facility], history of anxiety, depression,  history of sleep apnea, chronic smoking and COPD, history of  hypertension.   ALLERGIES:  CODEINE and CORTISONE shots.   FAMILY HISTORY:  Noncontributory.   The patient is divorced.  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  She has 3 older kids.  She is sedentary and pretty much waters her  gardens, watches TV and smokes   She does not drink.   PREVIOUS SURGICAL HISTORY:  Also includes previous left rotator cuff  surgery by Dr. Thurston Hole as recently as March 2009.   CURRENT MEDICATIONS:  1.  Klor-Con 10 a day.  2. Meprozine 50/25.  3. Lasix 40 b.i.d.  4. Clonidine 0.1 a day.  5. Methocarbamol 500 t.i.d.  6. An aspirin.  7. Cyclobenzaprine 10 a day.  8. Lisinopril 20/12.5.  9. Lovastatin 80 a day.  10.Plavix 75 a day.  11.Calcium.  12.Symbicort 160/4.5.   PHYSICAL EXAMINATION:  GENERAL:  Remarkable for a bronchitic female in  no distress.  VITAL SIGNS:  Her blood pressure is 130/80, pulse is 80 and regular,  respiratory rate 14, afebrile.  HEENT:  Unremarkable.  She has a left CEA scar and she has an anterior  cervical fusion scar on the lower right part of her neck.  NECK:  There is no JVP elevation.  No evidence of V-wave.  LUNGS:  Clear.  No active wheezing.  Good diaphragmatic motion.  S1 and  S2.  No RV heave.  No significant murmur.  PMI normal.  ABDOMEN:  Benign.  Bowel sounds positive.  No AAA.  No tenderness.  No  bruit.  No hepatosplenomegaly.  No hepatojugular reflux.  No tenderness.  EXTREMITIES:  Distal pulses are intact. No evidence of PVD.  No lower  extremity edema.  NEURO:  Nonfocal.  SKIN:  Warm and dry.  No muscular weakness.    Her electrocardiogram is normal showing a sinus rhythm and no previous  infarct.   IMPRESSION:  1. Chest pain, history of vascular disease, previous 70% diagonal      lesion with small vessels, left heart cath also indicated.      Continue aspirin and Plavix.  2. Dyspnea, likely secondary to chronic obstructive pulmonary disease      and smoking.  No evidence of pulmonary hypertension by echo.      History of sleep apnea.  Follow up right heart cath to assess      pulmonary artery pressures.  3. Smoking cessation per Dr. Blenda Nicely.  The patient down to half a pack      a day.  However, it seems like it will be difficult for her to get      rid of this.  In regards to her chronic obstructive pulmonary      disease, she will continue her Symbicort.  4. Previous left carotid endarterectomy.  Follow up [facility], she should have yearly duplex studies.  5. Question history of Raynaud disease.  Avoid beta-blocker if      possible.  She does not have fixed  peripheral vascular disease in      the lower extremities.  Clearly her Raynaud is exacerbated by her      smoking.  We will avoid extreme temperatures.  6. Hypertension, currently well controlled.  Continue clonidine.  7. Anxiety, depression.  Follow up with previous psychologist.      Continue Meprozine and methocarbamol.  8. Hypercholesterolemia in the setting of vascular disease.  Continue      lovastatin.  Lipid and liver profile per primary care MD.   Further recommendations will be based on the results of her right and  left heart cath.     Noralyn Pick. Eden Emms, MD, [facility]  Electronically Signed    PCN/MedQ  DD: 02/28/2008  DT: 02/29/2008  Job #: [id number]

## 2010-12-23 NOTE — Op Note (Signed)
Barbara French, Barbara French                ACCOUNT NO.:  [id number]   MEDICAL RECORD NO.:  [id number]          PATIENT TYPE:  INP   LOCATION:  2312                         FACILITY:  [facility]   PHYSICIAN:  Ines Bloomer, M.D. DATE OF BIRTH:  16-Sep-1955   DATE OF PROCEDURE:  09/07/2008  DATE OF DISCHARGE:                               OPERATIVE REPORT   PREOPERATIVE DIAGNOSIS:  Bilateral pulmonary infiltrates, probable  interstitial lung disease.   POSTOPERATIVE DIAGNOSIS:  Bilateral pulmonary infiltrates, probable  interstitial lung disease.   OPERATION PERFORMED:  Left video-assisted thoracoscopy lung biopsy x2.   SURGEON:  Ines Bloomer, MD   FIRST ASSISTANT:  Doree Fudge, PA-C   ANESTHESIA:  General anesthesia.   PROCEDURE IN DETAIL:  After percutaneous insertion of all monitoring  lines, the patient underwent general anesthesia.  She was prepped and  draped in the usual sterile manner.  She was turned to the left lateral  thoracotomy position.  A dual-lumen tube was inserted.  The left lung  was deflated.  Two trocar sites were made in the anterior and posterior  axillary line at the seventh intercostal space and two 10-mm trocars  were inserted.  At the mid axillary line at the fifth intercostal space,  we inserted a 5-mm probe.  Pictures were taken of the lung that showed  anthracosis but with some cobblestoning and scarring.  We used a grasper  to grasp the tip of the lingula and then coming from the lateral aspect,  we resected the lingular tip with 2 applications of the Covidien  stapler, a 4.5.  We then grasped the medial basilar segment of the left  lower lobe and in a like manner stapled with 3 applications of the  stapler.  The specimens were removed, part of one was sent for culture  and the other one was sent for frozen section which revealed probable  interstitial lung disease.  The chest tube was brought into the anterior  trocar sites and sutured in place  with 0 silk.  Single On-Q was inserted  subpleurally in the usual fashion.  The early trocar sites were closed  with 3-0 Vicryl and Dermabond for the skin.  The patient was returned to  the recovery room in serious condition.      Ines Bloomer, M.D.  Electronically Signed     DPB/MEDQ  D:  09/07/2008  T:  09/07/2008  Job:  [id number]

## 2010-12-23 NOTE — Cardiovascular Report (Signed)
Barbara French, THINNES                ACCOUNT NO.:  [id number]   MEDICAL RECORD NO.:  [id number]          PATIENT TYPE:  OIB   LOCATION:  1967                         FACILITY:  [facility]   PHYSICIAN:  Peter C. Eden Emms, MD, FACCDATE OF BIRTH:  24-Apr-1956   DATE OF PROCEDURE:  03/02/2008  DATE OF DISCHARGE:  03/02/2008                            CARDIAC CATHETERIZATION   PROCEDURE:  Right heart catheterization, left coronary angiography, and  left ventriculography.   INDICATIONS:  Pulmonary hypertension.   PROCEDURE:  After informed consent was obtained, the patient was  sterilely prepped and draped.  A 2% lidocaine was infiltrated around the  right common femoral artery and veins.  A 7-French venous sheath was  placed in the right common femoral vein and a 4-French arterial sheath  was placed in the right common femoral artery.  The left coronary artery  was engaged with the 4-French JR4 catheter.  The right coronary artery  was engaged with a 4-French 3DRC catheter and the left ventricle was  entered with the 4-French angled pigtail.  Right heart catheterization  was performed with the Swan-Ganz catheterization and samples were  obtained for saturation from the pulmonary artery and right femoral  artery.   FINDINGS:  Left ventriculography.  LV wall motion was normal.  EF was  60%.  There is no mitral regurgitation nor pullback from the left  ventricle to the aorta.  There is no gradient coronary angiography.  The  left main showed no significant disease.  The LAD had luminal  irregularities.  There was a 60% ostial first diagonal stenosis.  The  first diagonal small vessel.  There was a ramus that had no significant  disease and the left circumflex artery has no significant disease.  The  coronary system is right dominant.  The RCA has mild luminal  irregularities.   On right heart catheterization, mean right atrium 11 mmHg, right  ventricle 38/70 mmHg, PA 40/20 mmHg, mean wedge 16  mmHg, and mixed  venous O2 sat 67%.  Cardiac output 3.3, cardiac index 1.9, and pulmonary  vascular resistance index 6.3 Wood units.   ASSESSMENT:  1. Coronary artery disease, 60% ostial stenosis in the small first      diagonal.  2. Mild pulmonary hypertension likely secondary to elevated left      ventricular diastolic pressure and diastolic dysfunction.  3. Mildly elevated filling pressures likely due to diastolic      dysfunction.   PLAN:  Plan is going to be medical management of coronary artery disease  and diastolic dysfunction.   Dr. Charlton Haws was proctor for this case.      Marca Ancona, MD   Electronically Signed     ______________________________  Noralyn Pick. Eden Emms, MD, [facility]    DM/MEDQ  D:  03/02/2008  T:  03/03/2008  Job:  [id number]   cc:   Noralyn Pick. Eden Emms, MD, [facility]

## 2010-12-23 NOTE — Assessment & Plan Note (Signed)
[facility]                            CARDIOLOGY OFFICE NOTE   NAME:Barbara French, Barbara French                       MRN:          [id number]  DATE:04/04/2008                            DOB:          July 07, 1956    [name] returns today for followup.  She had chest pain with known  vascular disease in her carotids.  Her heart cath on March 02, 2008,  showed 60% ostial small diagonal lesion and nothing else.  We do not  think her chest pain was cardiac in nature.  She has had mildly elevated  filling pressures with history of hypertension.  She did not have  pulmonary hypertension.  Her PA pressure was 40/20.   She continues to have a slew of medications as antidepressants and  muscle relaxants.  She describes some postural hypotension and  dizziness.  I suspect it has to do with her medications.  She is on Klor-  Con and Lasix twice a day as needed for lower extremity edema.  She  takes Meprozine on occasion for pain.  She is on clonidine 0.1 b.i.d.,  an aspirin a day, cyclobenzaprine 10 a day,  lisinopril/hydrochlorothiazide 20/12.5, lovastatin 80 a day, Plavix 75 a  day, calcium, omega 3, Symbicort, and Zoloft generic dose unknown.   Her exam is remarkable for a bronchitic female in no distress.  Her  blood pressure is 130/70, pulse 70 and regular, respiratory 14,  afebrile, and weight 160.  HEENT:  Unremarkable.  NECK:  Carotids have a right bruit.  No lymphadenopathy, thyromegaly, or  JVP elevation.  LUNGS:  Mild end expiratory wheezes on exam.  HEART:  S1 and S2 with normal heart sounds.  PMI normal.  ABDOMEN:  Benign.  Bowel sounds positive.  No AAA, no tenderness, no  bruit, and no hepatosplenomegaly or hepatojugular reflux.  EXTREMITIES:  Distal pulses are intact.  No edema.  NEURO:  Nonfocal.  SKIN:  Warm and dry.  MUSCULOSKELETAL:  No muscular weakness.   IMPRESSION:  1. Bilateral carotid disease.  Follow up with [facility].  We will get a copy of her carotid duplexes.  She      does not indicate that she has severe disease.  2. Chest pain with stable ostial diagonal lesion.  Pain not likely to      be anginal.  Continue p.r.n. nitro and aspirin therapy.  3. Chronic obstructive pulmonary disease with smoking.  The patient is      not a good candidate for Chantix at this time since she is on      Zoloft and cyclobenzaprine.  She has had Chantix in the past.  She      will talk to her primary care MD when she is ready to quit.  I      would simplify her CNS-acting drugs first, as I think they are      causing some of her dizziness.  Continue p.r.n. albuterol nasal      spray.  I counseled the patient for less than 10 minutes regarding  her smoking cessation.  4. Lower extremity edema.  Continue hydrochlorothiazide, p.r.n. Lasix,      and replace potassium.  5. Hypercholesterolemia in the setting of vascular disease.  Continue      lovastatin.  Lipid and liver profile in 6 months.     Noralyn Pick. Eden Emms, MD, [facility]  Electronically Signed    PCN/MedQ  DD: 04/04/2008  DT: 04/05/2008  Job #: [id number]

## 2010-12-23 NOTE — Letter (Signed)
November 13, 2008   Tanvir A. Chodri, MD  [address]  [place], [place]. [address]   Re:  Barbara French, Barbara French                DOB:  1955-08-12   Dear Dr. Blenda Nicely:   I saw the patient back today.  Her 6-week chest x-ray is stable.  Blood  pressure is 100/75, pulse 88, respirations 18, and sats were 97%.  Lungs  were clear to auscultation and percussion.  I have released her back to  your care.  She was complaining of some swelling in her legs and I told  her to see a medical doctor about possibly upping her diuretics.   Ines Bloomer, M.D.  Electronically Signed   DPB/MEDQ  D:  11/13/2008  T:  11/14/2008  Job:  [id number]

## 2010-12-23 NOTE — Op Note (Signed)
Barbara French, French                ACCOUNT NO.:  [id number]   MEDICAL RECORD NO.:  [id number]          PATIENT TYPE:  OBV   LOCATION:  2550                         FACILITY:  [facility]   PHYSICIAN:  Barbara French, M.D. DATE OF BIRTH:  Aug 23, 1955   DATE OF PROCEDURE:  11/08/2007  DATE OF DISCHARGE:                               OPERATIVE REPORT   PREOPERATIVE DIAGNOSIS:  1. Left total partial rotator cuff tear and partial labrum tear.  2. Left shoulder impingement.  3. Left shoulder acromioclavicular joint degenerative joint disease      and spurring.   POSTOPERATIVE DIAGNOSIS:  1. Left total partial rotator cuff tear and partial labrum tear.  2. Left shoulder impingement.  3. Left shoulder acromioclavicular joint degenerative joint disease      and spurring.   PROCEDURE:  1. Left shoulder examination under anesthesia followed by arthroscopic      debridement partial rotator cuff tear, partial labrum tear.  2. Left shoulder subacromial decompression.  3. Left shoulder distal clavicle excision.   SURGEON:  Barbara Marvel, MD.   ASSISTANT:  Barbara Girt, PA.   ANESTHESIA:  General.   OPERATIVE TIME:  Forty-five minutes.   COMPLICATIONS:  None.   INDICATION FOR PROCEDURE:  Barbara French is a 55 year old woman who has had  a year of left shoulder pain increasing in nature with exam and MRI,  documenting partial rotator cuff tear with impingement and AC joint  arthropathy.  She has failed conservative care and is now to undergo  arthroscopy.   DESCRIPTION:  Barbara French was brought in the operating room on November 08, 2007, after an interscalene block was placed in the holding room by  Anesthesia.  She was placed on the operative table in the supine  position.  Her left shoulder was examined under anesthesia.  She had  full range of motion and her shoulder was stable to ligamentous exam.  She was then placed in the beach chair position and her shoulder and arm  was prepped  using sterile DuraPrep and draped using sterile technique.  Originally, through a posterior arthroscopic portal,  the arthroscope  with a pump attachment was placed and through an anterior portal and  arthroscopic probe was placed.  On initial inspection, the articular  cartilage in the glenohumeral joint was intact.  Anterior and superior  labrum partial tearing 20% which was debrided.  Anterior inferior labrum  and anterior inferior glenohumeral ligament complex was intact,  posterior labrum was intact, biceps tendon anchor and biceps tendon was  intact.  Rotator cuff showed a partial tear of the supraspinatus 25%  which was debrided.  The rest of the rotator cuff was intact.  The  inferior capsular recess free of pathology.  The subacromial space was  entered and a lateral arthroscopic portal was made.  Moderately  thickened bursitis was resected.  The rotator cuff was inflamed on the  bursal surface but no evidence of a tear.  Impingement was noted and a  subacromial decompression was carried out, removing 6-8 mm of the  undersurface of the anterior, anterolateral and  anteromedial acromion.  A CA ligament release carried out as well.  The [facility] joint showed  significant spurring and degenerative changes and the distal 6 mm of  clavicle was resected with a 6 mm bur.  After this was done the shoulder  could be brought through a full range of motion with no impingement on  the rotator cuff.  At this point it was felt that all pathology had been  satisfactorily addressed.  The instruments were removed.  The portal was  closed with #3-0 nylon suture, sterile dressings and a sling applied and  the patient awakened and taken to the recovery room in stable condition.  Needle and sponge counts correct x2 at the end of the case.   FOLLOWUP CARE:  1. Barbara French will be followed up over night for observation.  2. Discharge tomorrow, if stable, with early physical therapy.  3. See her back in the  office in a week for sutures out and follow.      Barbara French, M.D.  Electronically Signed     RAW/MEDQ  D:  11/08/2007  T:  11/08/2007  Job:  [id number]

## 2010-12-26 NOTE — Op Note (Signed)
NAME:  Barbara French, Barbara French                          ACCOUNT NO.:  [id number]   MEDICAL RECORD NO.:  [id number]                   PATIENT TYPE:  OIB   LOCATION:  NA                                   FACILITY:  [facility]   PHYSICIAN:  Henry A. Pool, M.D.                 DATE OF BIRTH:  26-Jul-1956   DATE OF PROCEDURE:  03/08/2003  DATE OF DISCHARGE:                                 OPERATIVE REPORT   PREOPERATIVE DIAGNOSIS:  C5-6 herniated nucleus pulposus with myelopathy.   POSTOPERATIVE DIAGNOSIS:  C5-6 herniated nucleus pulposus with myelopathy.   OPERATION:  C5-6 anterior cervical diskectomy with fusion, Allograft and  anterior plating.   SURGEON:  Kathaleen Maser. Pool, M.D.   ASSISTANT:  Annie Sable, M.D.   ANESTHESIA:  General endotracheal.   INDICATIONS FOR PROCEDURE:  Barbara French is a 55 year old female with history  of neck and bilateral upper extremity pain and paresthesias with progressive  weakness and bilateral lower extremity ataxia and spasticity consistent with  a progressive cervical myelopathy.  MRI scanning demonstrates a very large  central disc herniation at C5-6 with marked spinal cord compression.  The  patient had been counseled as to her options.  She has decided to proceed  with C5-6 anterior cervical diskectomy with fusion with Allograft and  anterior plating to help improve her symptoms.   DESCRIPTION OF PROCEDURE:  The patient was taken to the operating room and  placed on the table in a supine position.  After adequate level of  anesthesia achieved, the patient was positioned supine with the neck  slightly extended placed in Halter traction.  The anterior cervical region  was prepped and draped sterile.  Then made a linear skin incision overlying  the C5-6 interspace.  This was carried down sharply to the platysma.  The  platysma is divided vertically and dissection proceeded along the medial  border of the sternocleidomastoid muscle and carotid sheath.   Trachea and  esophagus were mobilized.  Prevertebral fascia was stripped off the anterior  spinal column.  The longus colli muscles were then elevated bilaterally  using electrocautery.  Deep self retaining retractor was placed.  Intraoperative fluoroscopy was used.  The C5-6 level was confirmed.  Disc  space then incised with a #15 blade. Wide disc space was then achieved using  pituitary rongeurs forward and backward and Carlens curets, rongeurs and  high speed drill.  All elements of the disc were removed down to the level  of the posterior annulus.  The remainder of the diskectomy, remaining  aspects of the annulus and osteophytes removed down to the level of the  posterior longitudinal ligament using the high speed drill.  Posterior  longitudinal ligament was then elevated and resected in the usual fashion  using Kerrison rongeurs.  The underlying thecal sac was identified.  A large  amount of subligamentous disc herniation was encountered  and completely  resected.  The bodies of C5 and C6 were then undercut using Kerrison  rongeurs to complete the decompression.  Decompression then proceeded out to  each C6 foramen.  C6 nerve root was identified proximally and blunt probe  was passed easily along their course.  The wound was then irrigated with  antibiotic solution.  Gelfoam was placed topically for hemostasis, found to  be good.  Disc space was then distracted and a 7 mm patellar wedge Allograft  was then packed into place proximally 1 mm from the anterior cortical  surface.  A 23 mm Atlantis anterior cervical plate was then placed over the  C5 and C6 levels.  This was then attached under fluoroscopic guidance using  13 mm screws, two each at C5 and C6.  All four screws were given a final  tightening and found to be solid in bone.  Locking screws from the edge at  both levels.  Final images revealed good position of bone graft proper level  supine.  Wound was inspected for  hemostasis, found to be good.  It was then  irrigated one final time and then closed in a typical fashion.  Steri-Strips  and sterile dressing were applied.  There were no intraoperative  complications.  The patient tolerated the procedure well and she returns to  recovery.                                               Henry A. Pool, M.D.    HAP/MEDQ  D:  03/08/2003  T:  03/08/2003  Job:  [id number]

## 2010-12-26 NOTE — Cardiovascular Report (Signed)
NAMEJULIO, STORR                ACCOUNT NO.:  [id number]   MEDICAL RECORD NO.:  [id number]          PATIENT TYPE:  OIB   LOCATION:  2899                         FACILITY:  [facility]   PHYSICIAN:  Learta Codding, M.D. LHCDATE OF BIRTH:  [birth year]   DATE OF PROCEDURE:  07/24/2004  DATE OF DISCHARGE:                              CARDIAC CATHETERIZATION   REFERRING PHYSICIAN:  1.  Dr. Mathis Bud.  2.  Doreen Beam, M.D.  3.  Glean Hess R. Revankar, M.D.   PROCEDURES PERFORMED:  1.  Left heart catheterization with selective coronary angiography.  2.  Ventriculography.   DIAGNOSES:  1.  Nonobstructive coronary artery disease with ostial disease of a small      diagonal branch.  2.  Diabetic atherosclerosis.  3.  Normal left ventricular systolic function.   INDICATIONS:  The patient is a 55 year old female with multiple cardiac risk  factors and substernal chest pain.  A recent Cardiolite study was negative  for ischemia, but due to ongoing pain, the patient was referred for cardiac  catheterization to assess her coronary anatomy.   DESCRIPTION OF PROCEDURE:  After informed consent was obtained, the patient  was brought to the catheterization laboratory.  The right groin was  sterilely prepped and draped.  A 6 French arterial sheath was placed using a  modified Seldinger technique in the right femoral artery.  Then 6 [place]  and JR4 standard angiographic catheters were used for selective coronary  angiography.  A 6 French angled pigtail catheter was used for  ventriculography.  Ventriculography was performed in single plane RAO  projection using power injection of contrast.  At the termination of the  procedure, all catheter sheaths were removed and the patient was brought  back to the holding area.  Adequate hemostasis was obtained.  No  complications were encountered.   FINDINGS:  1.  Hemodynamics:  Left ventricular pressure 116/5 mmHg.  Aortic pressure      116/59 mmHg.  No gradient  on pullback across the aortic valve.  2.  Ventriculography with ejection fraction 60% with no segmental wall      motion abnormalities and no mitral regurgitation.   SELECTIVE CORONARY ANGIOGRAPHY:  1.  The left main coronary artery was a moderate to large size vessel with      no evidence of flow-limiting disease.  2.  The left anterior descending artery was moderate size vessel with      diffuse atherosclerosis in the mid segment with stenosis of      approximately 30-40%.  Originating from this area was a first diagonal      branch which had bifurcational disease at the ostium approximately 70-      80%.  This vessel was rather small and likely no more than 1.5 mm in its      proximal segment.  The remainder of the LAD was free of flow-limiting      disease, although in the mid segment of the LAD we observed a myocardial      bridge without definite flow impairment.  3.  Ramus intermedius.  This was a small vessel which was bifurcating near      its origin.  There was no definite flow-limiting disease in this vessel.  4.  The circumflex coronary artery was a small vessel again with no evidence      of significant flow-limiting disease.  5.  The right coronary artery was a moderate size vessel given rise to a      very long posterolateral branch and posterior descending artery.  The      mid segment had approximately 20-30% diffuse stenosis.   CONCLUSION:  Angiographic images were reviewed and the results communicated  to Dr. Sherril Croon.  It appears that the patient has predominantly diabetic  atherosclerotic disease with rather small vessels.  The diagonal branch  could be a source of chest pain, although no definite ischemia was observed  on the Cardiolite stress study.  Further medical therapy is recommended.  PCI to this lesion would be of questionable benefit with a high restenosis  rate.  The patient will follow up with Dr. Sherril Croon in his office with further  aggressive risk factor  modification.      Michelle Piper   GED/MEDQ  D:  07/24/2004  T:  07/24/2004  Job:  [id number]   cc:   Dr. Noralee Space Vyas  [address]  [place]  [place] [address]  Fax: [phone]   Aundra Dubin. Revankar, M.D.  [address]  [place]  [place] [address]  Fax: [phone]

## 2010-12-26 NOTE — Op Note (Signed)
NAME:  Barbara French, Barbara French                          ACCOUNT NO.:  [id number]   MEDICAL RECORD NO.:  [id number]                   PATIENT TYPE:  AMB   LOCATION:  DSC                                  FACILITY:  [facility]   PHYSICIAN:  Loreta Ave, M.D.              DATE OF BIRTH:  05-Sep-1955   DATE OF PROCEDURE:  01/23/2004  DATE OF DISCHARGE:                                 OPERATIVE REPORT   PREOPERATIVE DIAGNOSES:  Right shoulder chronic subacromial impingement,  partial thickness tearing, rotator cuff.  Distal clavicle osteolysis and  chondromalacia.   POSTOPERATIVE DIAGNOSES:  Right shoulder chronic subacromial impingement,  partial thickness tearing, rotator cuff.  Distal clavicle osteolysis and  chondromalacia.   OPERATION PERFORMED:  Right shoulder examination under anesthesia,  arthroscopy, debridement of rotator cuff above and below.  Bursectomy.  Acromioplasty.  Release of coracoacromial ligament.  Excision of distal  clavicle.   SURGEON:  Loreta Ave, M.D.   ASSISTANT:  Arlys John D. Petrarca, P.A.-C.   ANESTHESIA:  General.   ESTIMATED BLOOD LOSS:  Minimal.   SPECIMENS:  None.   CULTURES:  None.   COMPLICATIONS:  None.   DRESSING:  Soft compressive with sling.   DESCRIPTION OF PROCEDURE:  The patient was brought to the operating room and  after adequate anesthesia had been obtained, the right shoulder was  examined.  Full motion good stability.  Placed in a beach chair position on  the shoulder positioner, prepped and draped in the usual sterile fashion.  Three standard arthroscopic portals, anterior, posterior lateral.  Shoulder  entered with blunt obturator, distended and inspected.  Extensive partial  thickness tearing, crescent region, rotator cuff, supraspinatus and top of  infraspinatus.  Thoroughly debrided.  No full thickness tears.  Biceps  tendon, biceps anchor, articular cartilage, labrum, capsule and ligamentous  structures intact.  Cannula  redirected subacromially.  Chronic subacromial  impingement.  Type 2 to 3 acromion.  Reactive bursitis and fraying on top of  the cuff but no full thickness tears.  Distal clavicle spurring.  Grade 3  and 4 chondromalacia and osteolysis.  Thick cuff debrided.  Bursa resected.  Acromioplasty to a type 1 acromion with shaver and high speed bur releasing  the CA ligament with cautery.  Distal clavicle exposed and lateral  centimeter and all periarticular spurs sharply resected.  Adequacy of  decompression and clavicle excision confirmed viewing  from all portals.  Instruments and fluid removed.  Portals of shoulder and  bursa injected with Marcaine.  Portals closed with 4-0 nylon.  Sterile  compressive dressing with sling applied.  Anesthesia reversed.  Brought to  recovery room.  Tolerated surgery well without complication.  Loreta Ave, M.D.    DFM/MEDQ  D:  01/23/2004  T:  01/23/2004  Job:  [id number]

## 2010-12-26 NOTE — Discharge Summary (Signed)
Barbara French, Barbara French                ACCOUNT NO.:  [id number]   MEDICAL RECORD NO.:  [id number]          PATIENT TYPE:  INP   LOCATION:  2030                         FACILITY:  [facility]   PHYSICIAN:  Ines Bloomer, M.D. DATE OF BIRTH:  September 14, 1955   DATE OF ADMISSION:  09/07/2008  DATE OF DISCHARGE:  09/14/2008                               DISCHARGE SUMMARY   ADDENDUM   Final and secondary diagnosis, as well as in-hospital operations,  procedures and the patient's H and P as previously dictated.   BRIEF HOSPITAL COURSE STAY:  Since last dictation on September 12, 2008  the patient remained afebrile, vital signs were stable.  O2 sat was 97%  on 2 L nasal cannula.  The patient complained of constipation.  She was  given magnesium citrate.  She then began passing flatus, however still  had not had a bowel movement.  Because of abdominal distention, a KUB  was ordered on September 13, 2008.  This showed air fluid levels with some  nondilated loops of small bowel findings most compatible with an ileus  also a moderate amount of stool from the ascending and proximal  transverse colon.  Followup KUB showed improving ileus with less gas in  the stomach and small bowel.  MiraLax, magnesium citrate as well as  Dulcolax were ordered.  The patient still had not yet had a bowel  movement.  Lactulose MiraLax and Dulcolax were ordered.  Again, the  patient did finally have a bowel movement.  The patient was still  requiring oxygen via nasal cannula.  Home oxygen was arranged.  She was  later discharged on September 14, 2008.   LATEST LABORATORY STUDIES:  CBC done on this date, H&H was 12.3 and 36  respectively, white count of 16,300, platelet count of 348,000.  Last  BMET also done on this date, potassium 4.7 (hemolyzed specimen), BUN and  creatinine were 8 and 0.53 respectively.  Amylase was normal at  72.  Final left lung tissue culture showed no yeast or fungus.      Doree Fudge,  [place]      Ines Bloomer, M.D.  Electronically Signed    DZ/MEDQ  D:  10/17/2008  T:  10/18/2008  Job:  [id number]   cc:   Eual Fines A. Chodri, M.D.

## 2011-01-26 DIAGNOSIS — Z0279 Encounter for issue of other medical certificate: Secondary | ICD-10-CM

## 2011-05-04 LAB — URINALYSIS, ROUTINE W REFLEX MICROSCOPIC
Glucose, UA: NEGATIVE
Hgb urine dipstick: NEGATIVE
Specific Gravity, Urine: 1.006
pH: 7

## 2011-05-04 LAB — COMPREHENSIVE METABOLIC PANEL
ALT: 19
AST: 27
Albumin: 3.5
CO2: 29
Calcium: 9.1
GFR calc Af Amer: >60
GFR calc non Af Amer: >60
Sodium: 140

## 2011-05-04 LAB — CBC
MCHC: 34.8
Platelets: 217
RBC: 4.25
WBC: 8.9

## 2011-05-04 LAB — URINE CULTURE

## 2011-05-04 LAB — DIFFERENTIAL
Eosinophils Absolute: 0.4
Eosinophils Relative: 4
Lymphs Abs: 2.1
Monocytes Absolute: 1
Monocytes Relative: 11

## 2011-05-05 LAB — BASIC METABOLIC PANEL WITH GFR
BUN: 9
CO2: 23
Calcium: 8.8
Chloride: 101
Creatinine, Ser: 0.85
GFR calc non Af Amer: >60
Glucose, Bld: 93
Potassium: 4.4
Sodium: 134 — ABNORMAL LOW

## 2011-05-05 LAB — CBC
HCT: 44.8
MCV: 100.6 — ABNORMAL HIGH
Platelets: 209
RDW: 13.5
WBC: 12.4 — ABNORMAL HIGH

## 2011-05-05 LAB — PROTIME-INR: Prothrombin Time: 12.5

## 2011-05-08 LAB — POCT I-STAT 3, VENOUS BLOOD GAS (G3P V)
Acid-base deficit: 2
Bicarbonate: 22.7
Operator id: 221371
TCO2: 24
pH, Ven: 7.385 — ABNORMAL HIGH

## 2011-05-08 LAB — POCT I-STAT 3, ART BLOOD GAS (G3+)
O2 Saturation: 88
TCO2: 26
pCO2 arterial: 40.6

## 2011-05-13 ENCOUNTER — Other Ambulatory Visit: Payer: Self-pay | Admitting: Rehabilitation

## 2011-05-13 DIAGNOSIS — M542 Cervicalgia: Secondary | ICD-10-CM

## 2011-05-13 DIAGNOSIS — M545 Low back pain, unspecified: Secondary | ICD-10-CM

## 2011-05-16 ENCOUNTER — Ambulatory Visit
Admission: RE | Admit: 2011-05-16 | Discharge: 2011-05-16 | Disposition: A | Discharge status: Home / Self Care | Payer: BC Managed Care – PPO | Source: Ambulatory Visit | Attending: Rehabilitation | Admitting: Rehabilitation

## 2011-05-16 DIAGNOSIS — M542 Cervicalgia: Secondary | ICD-10-CM

## 2011-05-16 DIAGNOSIS — M545 Low back pain, unspecified: Secondary | ICD-10-CM

## 2013-06-20 ENCOUNTER — Other Ambulatory Visit: Payer: Self-pay | Admitting: Orthopaedic Surgery

## 2013-06-20 DIAGNOSIS — M545 Low back pain, unspecified: Secondary | ICD-10-CM

## 2013-06-21 ENCOUNTER — Ambulatory Visit
Admission: RE | Admit: 2013-06-21 | Discharge: 2013-06-21 | Disposition: A | Discharge status: Home / Self Care | Payer: Medicare Other | Source: Ambulatory Visit | Attending: Orthopaedic Surgery | Admitting: Orthopaedic Surgery

## 2013-06-21 DIAGNOSIS — M545 Low back pain, unspecified: Secondary | ICD-10-CM

## 2014-02-27 ENCOUNTER — Encounter: Payer: Self-pay | Admitting: Podiatrist

## 2014-02-27 ENCOUNTER — Ambulatory Visit (INDEPENDENT_AMBULATORY_CARE_PROVIDER_SITE_OTHER): Payer: Commercial Managed Care - HMO

## 2014-02-27 ENCOUNTER — Ambulatory Visit (INDEPENDENT_AMBULATORY_CARE_PROVIDER_SITE_OTHER): Payer: Commercial Managed Care - HMO | Admitting: Podiatrist

## 2014-02-27 VITALS — BP 111/60 | HR 84 | Resp 12

## 2014-02-27 DIAGNOSIS — M722 Plantar fascial fibromatosis: Secondary | ICD-10-CM

## 2014-02-27 DIAGNOSIS — R52 Pain, unspecified: Secondary | ICD-10-CM

## 2014-02-27 MED ORDER — MELOXICAM 15 MG PO TABS: 15.0000 mg | ORAL_TABLET | Freq: Every day | ORAL | Status: DC

## 2014-02-27 MED ORDER — DICLOFENAC SODIUM 1 % TD GEL: 2.0000 g | Freq: Four times a day (QID) | TRANSDERMAL | Status: DC

## 2014-02-27 NOTE — Progress Notes (Signed)
   Subjective:    Patient ID: Barbara French, female    DOB: [birth year], 58 y.o.   MRN: [id number]  HPI  Pt stated lt foot heel is hurting for 1 mont. The foot is getting worse. The foot get aggravated by walking. Tried no treatment.    Review of Systems  Constitutional: Positive for unexpected weight change.  HENT: Positive for sinus pressure.   All other systems reviewed and are negative.      Objective:   Physical Exam Patient is awake, alert, and oriented x 3.  In no acute distress.  Vascular status is intact with palpable pedal pulses at 2/4 DP and PT bilateral and capillary refill time within normal limits. Neurological sensation is also intact bilaterally via Semmes Weinstein monofilament at 5/5 sites. Light touch, vibratory sensation, Achilles tendon reflex is intact. Dermatological exam reveals skin color, turger and texture as normal. No open lesions present.  Musculature intact with dorsiflexion, plantarflexion, inversion, eversion.  Pain on palpation plantar medial aspect of the left heel consistent with plantar fasciitis symptomatology is elicited.    Assessment & Plan:  Assessment: Plantar fasciitis left   Plan: Removable plantar fascial brace was dispensed. She was discussed on the importance of wearing good supportive sneaker and not going barefoot.. Today she wears a pair of flip-flops.  I started her on MOBIC anti-inflammatory recommended stretching exercises. If this is not beneficial we'll put her on a steroid Dosepak and physical therapy will be ordered. She cannot tolerate steroid injections and she states "it makes her paralyzed" we will avoid injections for this patient.

## 2014-02-27 NOTE — Patient Instructions (Signed)
Wear a good fitting shoe-  Running shoe brands I recommend are Rolena Infante, [employer] and [employer].  Always have a shoe fit specialist help you choose your shoes as there are many "varieties" of shoes and they can find you the best fit.  [employer]leet [employer]et sports/ [employer]Running ([place], [place], [place]), [employer], [employer] [place]) have trained staff to help you in this process.  The [employer] in [place] has a great selection of euro-comfort casual shoes with good comfort and support as well.  Avoid prolonged use of thin ballet type flats, or flip flops.  Everyday use of these shoes can actually cause foot problems or injuries.    Plantar Fasciitis (Heel Spur Syndrome) with Rehab The plantar fascia is a fibrous, ligament-like, soft-tissue structure that spans the bottom of the foot. Plantar fasciitis is a condition that causes pain in the foot due to inflammation of the tissue. SYMPTOMS   Pain and tenderness on the underneath side of the foot.  Pain that worsens with standing or walking. CAUSES  Plantar fasciitis is caused by irritation and injury to the plantar fascia on the underneath side of the foot. Common mechanisms of injury include:  Direct trauma to bottom of the foot.  Damage to a small nerve that runs under the foot where the main fascia attaches to the heel bone. Stress placed on the plantar fascia due to any mild increased activity or injury RISK INCREASES WITH:   Obesity.  Poor strength and flexibility.  Improperly fitted shoes.  Tight calf muscles.  Flat feet.  Failure to warm-up properly before activity.  PREVENTION  Warm up and stretch properly before activity.  Strength, flexibility  Maintain a health body weight.  Avoid stress on the plantar fascia.  Wear properly fitted shoes, including arch supports for individuals who have flat feet. PROGNOSIS  If treated properly, then the symptoms of plantar fasciitis usually resolve without  surgery. However, occasionally surgery is necessary. RELATED COMPLICATIONS   Recurrent symptoms that may result in a chronic condition.  Problems of the lower back that are caused by compensating for the injury, such as limping.  Pain or weakness of the foot during push-off following surgery.  Chronic inflammation, scarring, and partial or complete fascia tear, occurring more often from repeated injections. TREATMENT  Treatment initially involves the use of ice and medication to help reduce pain and inflammation. The use of strengthening and stretching exercises may help reduce pain with activity, especially stretches of the Achilles tendon.  Your caregiver may recommend that you use arch supports to help reduce stress on the plantar fascia. Often, corticosteroid injections are given to reduce inflammation. If symptoms persist for greater than 6 months despite non-surgical (conservative), then surgery may be recommended.  MEDICATION   If pain medication is necessary, then nonsteroidal anti-inflammatory medications, such as aspirin and ibuprofen, or other minor pain relievers, such as acetaminophen, are often recommended. Corticosteroid injections may be given by your caregiver.  HEAT AND COLD  Cold treatment (icing) relieves pain and reduces inflammation. Cold treatment should be applied for 10 to 15 minutes every 2 to 3 hours for inflammation and pain and immediately after any activity that aggravates your symptoms. Use ice packs or massage the area with a piece of ice (ice massage).  Heat treatment may be used prior to performing the stretching and strengthening activities prescribed by your caregiver, physical therapist, or athletic trainer. Use a heat pack or soak the injury in warm water. [place]  IF:  Treatment seems to offer no benefit, or the condition worsens.  Any medications produce adverse side effects.    EXERCISES-- perform each exercise a total of 10-15  repetitions.  Hold for 30 seconds and perform 3 times per day   RANGE OF MOTION (ROM) AND STRETCHING EXERCISES - Plantar Fasciitis (Heel Spur Syndrome) These exercises may help you when beginning to rehabilitate your injury.   While completing these exercises, remember:   Restoring tissue flexibility helps normal motion to return to the joints. This allows healthier, less painful movement and activity.  An effective stretch should be held for at least 30 seconds.  A stretch should never be painful. You should only feel a gentle lengthening or release in the stretched tissue. RANGE OF MOTION - Toe Extension, Flexion  Sit with your right / left leg crossed over your opposite knee.  Grasp your toes and gently pull them back toward the top of your foot. You should feel a stretch on the bottom of your toes and/or foot.  Hold this stretch for __________ seconds.  Now, gently pull your toes toward the bottom of your foot. You should feel a stretch on the top of your toes and or foot.  Hold this stretch for __________ seconds. Repeat __________ times. Complete this stretch __________ times per day.  RANGE OF MOTION - Ankle Dorsiflexion, Active Assisted  Remove shoes and sit on a chair that is preferably not on a carpeted surface.  Place right / left foot under knee. Extend your opposite leg for support.  Keeping your heel down, slide your right / left foot back toward the chair until you feel a stretch at your ankle or calf. If you do not feel a stretch, slide your bottom forward to the edge of the chair, while still keeping your heel down.  Hold this stretch for __________ seconds. Repeat __________ times. Complete this stretch __________ times per day.  STRETCH  Gastroc, Standing  Place hands on wall.  Extend right / left leg, keeping the front knee somewhat bent.  Slightly point your toes inward on your back foot.  Keeping your right / left heel on the floor and your knee  straight, shift your weight toward the wall, not allowing your back to arch.  You should feel a gentle stretch in the right / left calf. Hold this position for __________ seconds. Repeat __________ times. Complete this stretch __________ times per day. STRETCH  Soleus, Standing  Place hands on wall.  Extend right / left leg, keeping the other knee somewhat bent.  Slightly point your toes inward on your back foot.  Keep your right / left heel on the floor, bend your back knee, and slightly shift your weight over the back leg so that you feel a gentle stretch deep in your back calf.  Hold this position for __________ seconds. Repeat __________ times. Complete this stretch __________ times per day. STRETCH  Gastrocsoleus, Standing  Note: This exercise can place a lot of stress on your foot and ankle. Please complete this exercise only if specifically instructed by your caregiver.   Place the ball of your right / left foot on a step, keeping your other foot firmly on the same step.  Hold on to the wall or a rail for balance.  Slowly lift your other foot, allowing your body weight to press your heel down over the edge of the step.  You should feel a stretch in your right / left calf.  Hold this position for __________ seconds.  Repeat this exercise with a slight bend in your right / left knee. Repeat __________ times. Complete this stretch __________ times per day.  STRENGTHENING EXERCISES - Plantar Fasciitis (Heel Spur Syndrome)  These exercises may help you when beginning to rehabilitate your injury. They may resolve your symptoms with or without further involvement from your physician, physical therapist or athletic trainer. While completing these exercises, remember:   Muscles can gain both the endurance and the strength needed for everyday activities through controlled exercises.  Complete these exercises as instructed by your physician, physical therapist or athletic trainer.  Progress the resistance and repetitions only as guided.

## 2014-03-05 ENCOUNTER — Telehealth: Payer: Self-pay | Admitting: *Deleted

## 2014-03-05 NOTE — Telephone Encounter (Signed)
I was in there last week.  Reading my paperwork.  She wrote that she prescribed me 2 medications.  I called [place] [facility] and they said they haven't received nothing from her.  Call and let me know if she's calling it in.  My foot is bothering me.  She also put a removal pad on my foot.  It's getting really dirty and it's stinking.  Can I buy that stuff at the drugstore?

## 2014-03-06 ENCOUNTER — Other Ambulatory Visit: Payer: Self-pay | Admitting: Podiatrist

## 2014-03-06 NOTE — Telephone Encounter (Signed)
I LEFT A MESSAGE FOR Barbara French ABOUT HER MEDICINES AND THEY ARE AT THE [facility] IN [facility]. LISA

## 2014-10-18 ENCOUNTER — Other Ambulatory Visit: Payer: Self-pay | Admitting: Podiatrist

## 2015-05-07 DIAGNOSIS — Z9889 Other specified postprocedural states: Secondary | ICD-10-CM

## 2015-05-07 DIAGNOSIS — E088 Diabetes mellitus due to underlying condition with unspecified complications: Secondary | ICD-10-CM | POA: Insufficient documentation

## 2015-05-07 DIAGNOSIS — F17210 Nicotine dependence, cigarettes, uncomplicated: Secondary | ICD-10-CM | POA: Insufficient documentation

## 2015-05-07 DIAGNOSIS — E119 Type 2 diabetes mellitus without complications: Secondary | ICD-10-CM | POA: Insufficient documentation

## 2015-05-07 DIAGNOSIS — E785 Hyperlipidemia, unspecified: Secondary | ICD-10-CM | POA: Insufficient documentation

## 2015-05-07 DIAGNOSIS — F17200 Nicotine dependence, unspecified, uncomplicated: Secondary | ICD-10-CM

## 2015-05-07 HISTORY — DX: Type 2 diabetes mellitus without complications: E11.9

## 2015-05-07 HISTORY — DX: Nicotine dependence, cigarettes, uncomplicated: F17.210

## 2015-05-07 HISTORY — DX: Other specified postprocedural states: Z98.890

## 2015-05-07 HISTORY — DX: Diabetes mellitus due to underlying condition with unspecified complications: E08.8

## 2015-07-25 DIAGNOSIS — M1731 Unilateral post-traumatic osteoarthritis, right knee: Secondary | ICD-10-CM | POA: Insufficient documentation

## 2015-07-25 DIAGNOSIS — M25561 Pain in right knee: Secondary | ICD-10-CM | POA: Insufficient documentation

## 2015-07-25 HISTORY — DX: Unilateral post-traumatic osteoarthritis, right knee: M17.31

## 2015-08-27 DIAGNOSIS — E782 Mixed hyperlipidemia: Secondary | ICD-10-CM | POA: Diagnosis not present

## 2015-08-27 DIAGNOSIS — I10 Essential (primary) hypertension: Secondary | ICD-10-CM | POA: Diagnosis not present

## 2015-08-27 DIAGNOSIS — K210 Gastro-esophageal reflux disease with esophagitis: Secondary | ICD-10-CM | POA: Diagnosis not present

## 2015-08-27 DIAGNOSIS — E559 Vitamin D deficiency, unspecified: Secondary | ICD-10-CM | POA: Diagnosis not present

## 2015-08-27 DIAGNOSIS — R1314 Dysphagia, pharyngoesophageal phase: Secondary | ICD-10-CM | POA: Diagnosis not present

## 2015-08-27 DIAGNOSIS — E119 Type 2 diabetes mellitus without complications: Secondary | ICD-10-CM | POA: Diagnosis not present

## 2015-08-29 DIAGNOSIS — Z9889 Other specified postprocedural states: Secondary | ICD-10-CM | POA: Diagnosis not present

## 2015-08-29 DIAGNOSIS — E088 Diabetes mellitus due to underlying condition with unspecified complications: Secondary | ICD-10-CM | POA: Diagnosis not present

## 2015-08-29 DIAGNOSIS — I2510 Atherosclerotic heart disease of native coronary artery without angina pectoris: Secondary | ICD-10-CM | POA: Insufficient documentation

## 2015-08-29 DIAGNOSIS — E785 Hyperlipidemia, unspecified: Secondary | ICD-10-CM | POA: Diagnosis not present

## 2015-08-29 DIAGNOSIS — I10 Essential (primary) hypertension: Secondary | ICD-10-CM | POA: Diagnosis not present

## 2015-08-29 HISTORY — DX: Atherosclerotic heart disease of native coronary artery without angina pectoris: I25.10

## 2015-09-03 DIAGNOSIS — Z79891 Long term (current) use of opiate analgesic: Secondary | ICD-10-CM | POA: Diagnosis not present

## 2015-09-03 DIAGNOSIS — G894 Chronic pain syndrome: Secondary | ICD-10-CM | POA: Diagnosis not present

## 2015-09-03 DIAGNOSIS — M47892 Other spondylosis, cervical region: Secondary | ICD-10-CM | POA: Diagnosis not present

## 2015-09-03 DIAGNOSIS — Z79899 Other long term (current) drug therapy: Secondary | ICD-10-CM | POA: Diagnosis not present

## 2015-09-03 DIAGNOSIS — M5106 Intervertebral disc disorders with myelopathy, lumbar region: Secondary | ICD-10-CM | POA: Diagnosis not present

## 2015-09-06 DIAGNOSIS — J449 Chronic obstructive pulmonary disease, unspecified: Secondary | ICD-10-CM | POA: Diagnosis not present

## 2015-09-11 DIAGNOSIS — R1310 Dysphagia, unspecified: Secondary | ICD-10-CM | POA: Diagnosis not present

## 2015-09-11 DIAGNOSIS — K222 Esophageal obstruction: Secondary | ICD-10-CM | POA: Diagnosis not present

## 2015-09-11 DIAGNOSIS — K224 Dyskinesia of esophagus: Secondary | ICD-10-CM | POA: Diagnosis not present

## 2015-09-19 DIAGNOSIS — E088 Diabetes mellitus due to underlying condition with unspecified complications: Secondary | ICD-10-CM | POA: Diagnosis not present

## 2015-09-19 DIAGNOSIS — I10 Essential (primary) hypertension: Secondary | ICD-10-CM | POA: Diagnosis not present

## 2015-09-19 DIAGNOSIS — E785 Hyperlipidemia, unspecified: Secondary | ICD-10-CM | POA: Diagnosis not present

## 2015-09-19 DIAGNOSIS — Z9889 Other specified postprocedural states: Secondary | ICD-10-CM | POA: Diagnosis not present

## 2015-09-19 DIAGNOSIS — I2510 Atherosclerotic heart disease of native coronary artery without angina pectoris: Secondary | ICD-10-CM | POA: Diagnosis not present

## 2015-09-26 DIAGNOSIS — F419 Anxiety disorder, unspecified: Secondary | ICD-10-CM | POA: Diagnosis not present

## 2015-09-26 DIAGNOSIS — E669 Obesity, unspecified: Secondary | ICD-10-CM | POA: Diagnosis not present

## 2015-09-26 DIAGNOSIS — J309 Allergic rhinitis, unspecified: Secondary | ICD-10-CM | POA: Diagnosis not present

## 2015-09-26 DIAGNOSIS — J209 Acute bronchitis, unspecified: Secondary | ICD-10-CM | POA: Diagnosis not present

## 2015-09-26 DIAGNOSIS — Z6831 Body mass index (BMI) 31.0-31.9, adult: Secondary | ICD-10-CM | POA: Diagnosis not present

## 2015-09-27 DIAGNOSIS — R1310 Dysphagia, unspecified: Secondary | ICD-10-CM | POA: Diagnosis not present

## 2015-09-27 DIAGNOSIS — R933 Abnormal findings on diagnostic imaging of other parts of digestive tract: Secondary | ICD-10-CM | POA: Diagnosis not present

## 2015-09-27 DIAGNOSIS — K222 Esophageal obstruction: Secondary | ICD-10-CM | POA: Diagnosis not present

## 2015-09-27 DIAGNOSIS — Z9889 Other specified postprocedural states: Secondary | ICD-10-CM | POA: Diagnosis not present

## 2015-09-30 HISTORY — PX: ESOPHAGOGASTRODUODENOSCOPY: SHX1529

## 2015-10-01 DIAGNOSIS — G894 Chronic pain syndrome: Secondary | ICD-10-CM | POA: Diagnosis not present

## 2015-10-02 DIAGNOSIS — I10 Essential (primary) hypertension: Secondary | ICD-10-CM | POA: Diagnosis not present

## 2015-10-02 DIAGNOSIS — E088 Diabetes mellitus due to underlying condition with unspecified complications: Secondary | ICD-10-CM | POA: Diagnosis not present

## 2015-10-02 DIAGNOSIS — Z9889 Other specified postprocedural states: Secondary | ICD-10-CM | POA: Diagnosis not present

## 2015-10-02 DIAGNOSIS — E785 Hyperlipidemia, unspecified: Secondary | ICD-10-CM | POA: Diagnosis not present

## 2015-10-07 DIAGNOSIS — J449 Chronic obstructive pulmonary disease, unspecified: Secondary | ICD-10-CM | POA: Diagnosis not present

## 2015-10-24 DIAGNOSIS — E119 Type 2 diabetes mellitus without complications: Secondary | ICD-10-CM | POA: Diagnosis not present

## 2015-10-24 DIAGNOSIS — J309 Allergic rhinitis, unspecified: Secondary | ICD-10-CM | POA: Diagnosis not present

## 2015-10-29 DIAGNOSIS — M542 Cervicalgia: Secondary | ICD-10-CM | POA: Diagnosis not present

## 2015-10-29 DIAGNOSIS — M5106 Intervertebral disc disorders with myelopathy, lumbar region: Secondary | ICD-10-CM | POA: Diagnosis not present

## 2015-10-29 DIAGNOSIS — G894 Chronic pain syndrome: Secondary | ICD-10-CM | POA: Diagnosis not present

## 2015-11-04 DIAGNOSIS — J449 Chronic obstructive pulmonary disease, unspecified: Secondary | ICD-10-CM | POA: Diagnosis not present

## 2015-11-15 ENCOUNTER — Other Ambulatory Visit: Payer: Self-pay | Admitting: Orthopedic Surgery

## 2015-11-21 DIAGNOSIS — Z79899 Other long term (current) drug therapy: Secondary | ICD-10-CM | POA: Diagnosis not present

## 2015-11-21 DIAGNOSIS — K121 Other forms of stomatitis: Secondary | ICD-10-CM | POA: Diagnosis not present

## 2015-11-21 DIAGNOSIS — J309 Allergic rhinitis, unspecified: Secondary | ICD-10-CM | POA: Diagnosis not present

## 2015-11-21 DIAGNOSIS — J029 Acute pharyngitis, unspecified: Secondary | ICD-10-CM | POA: Diagnosis not present

## 2015-11-26 DIAGNOSIS — M5106 Intervertebral disc disorders with myelopathy, lumbar region: Secondary | ICD-10-CM | POA: Diagnosis not present

## 2015-11-26 DIAGNOSIS — G894 Chronic pain syndrome: Secondary | ICD-10-CM | POA: Diagnosis not present

## 2015-11-26 DIAGNOSIS — M542 Cervicalgia: Secondary | ICD-10-CM | POA: Diagnosis not present

## 2015-12-03 DIAGNOSIS — G4710 Hypersomnia, unspecified: Secondary | ICD-10-CM | POA: Diagnosis not present

## 2015-12-04 ENCOUNTER — Ambulatory Visit: Payer: Self-pay | Admitting: Neurology

## 2015-12-05 DIAGNOSIS — J449 Chronic obstructive pulmonary disease, unspecified: Secondary | ICD-10-CM | POA: Diagnosis not present

## 2015-12-09 DIAGNOSIS — Z95820 Peripheral vascular angioplasty status with implants and grafts: Secondary | ICD-10-CM | POA: Diagnosis not present

## 2015-12-09 DIAGNOSIS — I6523 Occlusion and stenosis of bilateral carotid arteries: Secondary | ICD-10-CM | POA: Diagnosis not present

## 2015-12-16 DIAGNOSIS — I70213 Atherosclerosis of native arteries of extremities with intermittent claudication, bilateral legs: Secondary | ICD-10-CM

## 2015-12-16 DIAGNOSIS — E785 Hyperlipidemia, unspecified: Secondary | ICD-10-CM | POA: Diagnosis not present

## 2015-12-16 DIAGNOSIS — I10 Essential (primary) hypertension: Secondary | ICD-10-CM | POA: Diagnosis not present

## 2015-12-16 DIAGNOSIS — I6523 Occlusion and stenosis of bilateral carotid arteries: Secondary | ICD-10-CM

## 2015-12-16 DIAGNOSIS — E139 Other specified diabetes mellitus without complications: Secondary | ICD-10-CM | POA: Diagnosis not present

## 2015-12-16 HISTORY — DX: Occlusion and stenosis of bilateral carotid arteries: I65.23

## 2015-12-16 HISTORY — DX: Atherosclerosis of native arteries of extremities with intermittent claudication, bilateral legs: I70.213

## 2015-12-17 DIAGNOSIS — E119 Type 2 diabetes mellitus without complications: Secondary | ICD-10-CM | POA: Diagnosis not present

## 2015-12-17 DIAGNOSIS — H35033 Hypertensive retinopathy, bilateral: Secondary | ICD-10-CM | POA: Diagnosis not present

## 2015-12-17 DIAGNOSIS — H2513 Age-related nuclear cataract, bilateral: Secondary | ICD-10-CM | POA: Diagnosis not present

## 2015-12-17 DIAGNOSIS — H524 Presbyopia: Secondary | ICD-10-CM | POA: Diagnosis not present

## 2015-12-25 DIAGNOSIS — G894 Chronic pain syndrome: Secondary | ICD-10-CM | POA: Diagnosis not present

## 2015-12-25 DIAGNOSIS — M5106 Intervertebral disc disorders with myelopathy, lumbar region: Secondary | ICD-10-CM | POA: Diagnosis not present

## 2016-01-04 DIAGNOSIS — J449 Chronic obstructive pulmonary disease, unspecified: Secondary | ICD-10-CM | POA: Diagnosis not present

## 2016-01-09 ENCOUNTER — Encounter (HOSPITAL_COMMUNITY)
Admission: RE | Admit: 2016-01-09 | Discharge: 2016-01-09 | Disposition: A | Discharge status: Home / Self Care | Payer: Medicare Other | Source: Ambulatory Visit | Attending: Orthopedic Surgery | Admitting: Orthopedic Surgery

## 2016-01-09 ENCOUNTER — Encounter (HOSPITAL_COMMUNITY): Payer: Self-pay

## 2016-01-09 ENCOUNTER — Ambulatory Visit (HOSPITAL_COMMUNITY)
Admission: RE | Admit: 2016-01-09 | Discharge: 2016-01-09 | Disposition: A | Discharge status: Home / Self Care | Payer: Medicare Other | Source: Ambulatory Visit | Attending: Orthopedic Surgery | Admitting: Orthopedic Surgery

## 2016-01-09 DIAGNOSIS — Z01818 Encounter for other preprocedural examination: Secondary | ICD-10-CM | POA: Diagnosis not present

## 2016-01-09 DIAGNOSIS — Z01812 Encounter for preprocedural laboratory examination: Secondary | ICD-10-CM | POA: Diagnosis not present

## 2016-01-09 HISTORY — DX: Anesthesia of skin: R20.2

## 2016-01-09 HISTORY — DX: Paresthesia of skin: R20.0

## 2016-01-09 HISTORY — DX: Unspecified disorder of circulatory system: I99.9

## 2016-01-09 HISTORY — DX: Anemia, unspecified: D64.9

## 2016-01-09 HISTORY — DX: Headache, unspecified: R51.9

## 2016-01-09 HISTORY — DX: Acute myocardial infarction, unspecified: I21.9

## 2016-01-09 HISTORY — DX: Gastro-esophageal reflux disease without esophagitis: K21.9

## 2016-01-09 HISTORY — DX: Essential (primary) hypertension: I10

## 2016-01-09 HISTORY — DX: Occlusion and stenosis of unspecified carotid artery: I65.29

## 2016-01-09 HISTORY — DX: Localized edema: R60.0

## 2016-01-09 HISTORY — DX: Depression, unspecified: F32.A

## 2016-01-09 HISTORY — DX: Other pulmonary embolism without acute cor pulmonale: I26.99

## 2016-01-09 HISTORY — DX: Chronic obstructive pulmonary disease, unspecified: J44.9

## 2016-01-09 HISTORY — DX: Sleep apnea, unspecified: G47.30

## 2016-01-09 HISTORY — DX: Personal history of other diseases of the respiratory system: Z87.09

## 2016-01-09 HISTORY — DX: Transient cerebral ischemic attack, unspecified: G45.9

## 2016-01-09 HISTORY — DX: Atherosclerotic heart disease of native coronary artery without angina pectoris: I25.10

## 2016-01-09 HISTORY — DX: Polyneuropathy, unspecified: G62.9

## 2016-01-09 HISTORY — DX: Vitamin D deficiency, unspecified: E55.9

## 2016-01-09 HISTORY — DX: Unspecified osteoarthritis, unspecified site: M19.90

## 2016-01-09 HISTORY — DX: Hyperlipidemia, unspecified: E78.5

## 2016-01-09 HISTORY — DX: Headache: R51

## 2016-01-09 HISTORY — DX: Major depressive disorder, single episode, unspecified: F32.9

## 2016-01-09 LAB — GLUCOSE, CAPILLARY: GLUCOSE-CAPILLARY: 85 mg/dL (ref 65–99)

## 2016-01-09 LAB — PROTIME-INR
INR: 0.95 (ref 0.00–1.49)
PROTHROMBIN TIME: 12.9 s (ref 11.6–15.2)

## 2016-01-09 LAB — COMPREHENSIVE METABOLIC PANEL
ALT: 16 U/L (ref 14–54)
AST: 19 U/L (ref 15–41)
Albumin: 3.7 g/dL (ref 3.5–5.0)
Alkaline Phosphatase: 42 U/L (ref 38–126)
Anion gap: 7 (ref 5–15)
BUN: 10 mg/dL (ref 6–20)
CHLORIDE: 104 mmol/L (ref 101–111)
CO2: 24 mmol/L (ref 22–32)
CREATININE: 0.79 mg/dL (ref 0.44–1.00)
Calcium: 9.4 mg/dL (ref 8.9–10.3)
Glucose, Bld: 92 mg/dL (ref 65–99)
POTASSIUM: 4.4 mmol/L (ref 3.5–5.1)
SODIUM: 135 mmol/L (ref 135–145)
Total Bilirubin: 0.2 mg/dL — ABNORMAL LOW (ref 0.3–1.2)
Total Protein: 7 g/dL (ref 6.5–8.1)

## 2016-01-09 LAB — APTT: aPTT: 30 seconds (ref 24–37)

## 2016-01-09 LAB — CBC WITH DIFFERENTIAL/PLATELET
BASOS ABS: 0 10*3/uL (ref 0.0–0.1)
Basophils Relative: 0 %
EOS ABS: 0.1 10*3/uL (ref 0.0–0.7)
EOS PCT: 2 %
HCT: 45.6 % (ref 36.0–46.0)
Hemoglobin: 15 g/dL (ref 12.0–15.0)
LYMPHS ABS: 2.1 10*3/uL (ref 0.7–4.0)
Lymphocytes Relative: 25 %
MCH: 30.5 pg (ref 26.0–34.0)
MCHC: 32.9 g/dL (ref 30.0–36.0)
MCV: 92.7 fL (ref 78.0–100.0)
MONO ABS: 0.8 10*3/uL (ref 0.1–1.0)
Monocytes Relative: 9 %
Neutro Abs: 5.6 10*3/uL (ref 1.7–7.7)
Neutrophils Relative %: 64 %
PLATELETS: 319 10*3/uL (ref 150–400)
RBC: 4.92 MIL/uL (ref 3.87–5.11)
RDW: 14.4 % (ref 11.5–15.5)
WBC: 8.7 10*3/uL (ref 4.0–10.5)

## 2016-01-09 LAB — URINALYSIS, ROUTINE W REFLEX MICROSCOPIC
Bilirubin Urine: NEGATIVE
Glucose, UA: NEGATIVE mg/dL
Hgb urine dipstick: NEGATIVE
KETONES UR: NEGATIVE mg/dL
LEUKOCYTES UA: NEGATIVE
Nitrite: NEGATIVE
PH: 7 (ref 5.0–8.0)
Protein, ur: NEGATIVE mg/dL
SPECIFIC GRAVITY, URINE: 1.018 (ref 1.005–1.030)

## 2016-01-09 LAB — TYPE AND SCREEN
ABO/RH(D): A POS
ANTIBODY SCREEN: NEGATIVE

## 2016-01-09 LAB — SURGICAL PCR SCREEN
MRSA, PCR: NEGATIVE
STAPHYLOCOCCUS AUREUS: NEGATIVE

## 2016-01-09 NOTE — Progress Notes (Signed)
PCP - Dr. Nicholos Johns Cardiologist - Dr. Geraldo Pitter Pulmonologist - Dr. Welford Roche  EKG - 04/2015- requested tracing CXR - 01/09/16  Echo - 09/2015 - Care Everywhere Stress - 09/2015 - Care Everywhere Cardiac Cath - states she has had several but doesn't remember last cath - requested from [facility]  Patient states that she checks her blood sugar every evening but does not know her fasting glucose.

## 2016-01-09 NOTE — Pre-Procedure Instructions (Addendum)
Barbara French  01/09/2016      [facility] - [place], [place] - [address] [place] [address] Phone: [phone] Fax: [phone]    Your procedure is scheduled on Monday, June 12th, 2017.  Report to [facility] Admitting at 5:30 A.M.   Call this number if you have problems the morning of surgery:  [phone]   Remember:  Do not eat food or drink liquids after midnight.   Take these medicines the morning of surgery with A SIP OF WATER: Alprazolam (Xanax), Symbicort Inhaler, Bupropion (Wellbutrin), Cyclobenzaprine (Flexeril) if needed, Diltiazem (Cardizem), duloxetine (Cymbalta), Hydrocodone (Dilaudid), Pantoprazole (Protonix), Ranitidine (Zantac).   WHAT DO I DO ABOUT MY DIABETES MEDICATION?  [name] Kitchen Do not take oral diabetes medicines (pills) the morning of surgery.  Do NOT take Metformin the morning of surgery.   Follow your MD's instructions on the Plavix.   7 days prior to surgery, stop taking: Aspirin, NSAIDS, Aleve, Naproxen, Ibuprofen, Advil, Motrin, BC's, Goody's, Fish oil, all herbal medications, and all vitamins.     Do not wear jewelry, make-up or nail polish.  Do not wear lotions, powders, or perfumes.    Do not shave 48 hours prior to surgery.   Do not bring valuables to the hospital.   [facility] is not responsible for any belongings or valuables.  Contacts, dentures or bridgework may not be worn into surgery.  Leave your suitcase in the car.  After surgery it may be brought to your room.  For patients admitted to the hospital, discharge time will be determined by your treatment team.  Patients discharged the day of surgery will not be allowed to drive home.   Special instructions:  See attached.   Please read over the following fact sheets that you were given. Pain Booklet, Coughing and Deep Breathing, Blood Transfusion Information, MRSA Information and Surgical Site Infection Prevention      How  to Manage Your Diabetes Before and After Surgery  Why is it important to control my blood sugar before and after surgery? . Improving blood sugar levels before and after surgery helps healing and can limit problems. . A way of improving blood sugar control is eating a healthy diet by: o  Eating less sugar and carbohydrates o  Increasing activity/exercise o  Talking with your doctor about reaching your blood sugar goals . High blood sugars (greater than 180 mg/dL) can raise your risk of infections and slow your recovery, so you will need to focus on controlling your diabetes during the weeks before surgery. . Make sure that the doctor who takes care of your diabetes knows about your planned surgery including the date and location.  How do I manage my blood sugar before surgery? . Check your blood sugar at least 4 times a day, starting 2 days before surgery, to make sure that the level is not too high or low. o Check your blood sugar the morning of your surgery when you wake up and every 2 hours until you get to the Short Stay unit. . If your blood sugar is less than 70 mg/dL, you will need to treat for low blood sugar: o Do not take insulin. o Treat a low blood sugar (less than 70 mg/dL) with  cup of clear juice (cranberry or apple), 4 glucose tablets, OR glucose gel. o Recheck blood sugar in 15 minutes after treatment (to make sure it is greater than 70 mg/dL). If  your blood sugar is not greater than 70 mg/dL on recheck, call [phone] for further instructions. . Report your blood sugar to the short stay nurse when you get to Short Stay.  . If you are admitted to the hospital after surgery: o Your blood sugar will be checked by the staff and you will probably be given insulin after surgery (instead of oral diabetes medicines) to make sure you have good blood sugar levels. o The goal for blood sugar control after surgery is 80-180 mg/dL.      [place]- Preparing For  Surgery  Before surgery, you can play an important role. Because skin is not sterile, your skin needs to be as free of germs as possible. You can reduce the number of germs on your skin by washing with CHG (chlorahexidine gluconate) Soap before surgery.  CHG is an antiseptic cleaner which kills germs and bonds with the skin to continue killing germs even after washing.  Please do not use if you have an allergy to CHG or antibacterial soaps. If your skin becomes reddened/irritated stop using the CHG.  Do not shave (including legs and underarms) for at least 48 hours prior to first CHG shower. It is OK to shave your face.  Please follow these instructions carefully.   1. Shower the NIGHT BEFORE SURGERY and the MORNING OF SURGERY with CHG.   2. If you chose to wash your hair, wash your hair first as usual with your normal shampoo.  3. After you shampoo, rinse your hair and body thoroughly to remove the shampoo.  4. Use CHG as you would any other liquid soap. You can apply CHG directly to the skin and wash gently with a scrungie or a clean washcloth.   5. Apply the CHG Soap to your body ONLY FROM THE NECK DOWN.  Do not use on open wounds or open sores. Avoid contact with your eyes, ears, mouth and genitals (private parts). Wash genitals (private parts) with your normal soap.  6. Wash thoroughly, paying special attention to the area where your surgery will be performed.  7. Thoroughly rinse your body with warm water from the neck down.  8. DO NOT shower/wash with your normal soap after using and rinsing off the CHG Soap.  9. Pat yourself dry with a CLEAN TOWEL.   10. Wear CLEAN PAJAMAS   11. Place CLEAN SHEETS on your bed the night of your first shower and DO NOT SLEEP WITH PETS.    Day of Surgery: Do not apply any deodorants/lotions. Please wear clean clothes to the hospital/surgery center.

## 2016-01-10 ENCOUNTER — Other Ambulatory Visit: Payer: Self-pay | Admitting: Orthopedic Surgery

## 2016-01-10 DIAGNOSIS — I70213 Atherosclerosis of native arteries of extremities with intermittent claudication, bilateral legs: Secondary | ICD-10-CM | POA: Diagnosis not present

## 2016-01-10 LAB — HEMOGLOBIN A1C
HEMOGLOBIN A1C: 6.8 % — AB (ref 4.8–5.6)
Mean Plasma Glucose: 148 mg/dL

## 2016-01-10 NOTE — Progress Notes (Signed)
Anesthesia Chart Review:  Pt is a 60 year old female scheduled for R total knee arthroplasty on 01/20/2016 with Dr. Ronnie Derby.   Cardiologist is Dr. Jyl Heinz.   PMH includes:  CAD, MI, HTN, DM, carotid artery stenosis (s/p L CEA and L carotid stenting), TIA, hyperlipidemia, PE, anemia, COPD, OSA, asthma, thyroid disease. Current smoker. BMI 31  Medications include: ASA, symbicort, bumetanide, plavix, diltiazem, fenofibrate, losartan, metformin, protonix, zantac. Pt to stop plavix 7 days prior to surgery.   Preoperative labs reviewed.  HgbA1c 6.8, glucose 92  Chest x-ray 01/09/16 reviewed. No acute disease  Carotid duplex 12/09/15:  - 1-39% stenosis of the right internal carotid artery  - patent stent on the left   EKG 05/07/15: sinus rhythm. LAFB. T abnormality, possible inferior ischemia.   Echo 10/02/15:  1. EF 55-60%. Grade II diastolic dysfunction.  2. Structurally normal TV with trace regurgitation 3. Structurally normal PV with mild regurgitation 4. Insignificant pericardial effusion.   Nuclear stress test 09/19/15: No evidence of ischemia. EF normal  Cardiac cath 01/26/14 ([facility]):  1. Nonobstructive CAD, diffusely small caliber without focal stenosis 2. Single kidney with patent renal artery 3. Normal LV function 4. LE runoff angio: widely patent iliacs, aorta, femorals  If no changes, I anticipate pt can proceed with surgery as scheduled.   Willeen Cass, FNP-BC [facility] Short Stay Surgical Center/Anesthesiology Phone: [phone] 01/10/2016 12:53 PM

## 2016-01-11 LAB — URINE CULTURE

## 2016-01-14 DIAGNOSIS — G4711 Idiopathic hypersomnia with long sleep time: Secondary | ICD-10-CM | POA: Diagnosis not present

## 2016-01-15 DIAGNOSIS — M1731 Unilateral post-traumatic osteoarthritis, right knee: Secondary | ICD-10-CM | POA: Diagnosis not present

## 2016-01-17 MED ORDER — SODIUM CHLORIDE 0.9 % IV SOLN
1000.0000 mg | INTRAVENOUS | Status: AC
  Administered 2016-01-20: 1000 mg via INTRAVENOUS
  Filled 2016-01-17: qty 10

## 2016-01-19 MED ORDER — CEFAZOLIN SODIUM-DEXTROSE 2-4 GM/100ML-% IV SOLN
2.0000 g | INTRAVENOUS | Status: DC
  Filled 2016-01-19: qty 100

## 2016-01-19 NOTE — Anesthesia Preprocedure Evaluation (Addendum)
Anesthesia Evaluation  Patient identified by MRN, date of birth, ID band Patient awake    Reviewed: Allergy & Precautions, H&P , NPO status , Patient's Chart, lab work & pertinent test results  Airway Mallampati: I  TM Distance: >3 FB Neck ROM: full    Dental no notable dental hx. (+) Dental Advisory Given, Teeth Intact   Pulmonary COPD, Current Smoker, PE   Pulmonary exam normal breath sounds clear to auscultation       Cardiovascular Exercise Tolerance: Good hypertension, Pt. on medications + CAD, + Past MI and + Peripheral Vascular Disease  negative cardio ROS Normal cardiovascular exam     Neuro/Psych Anxiety Depression Carotid stenosis. neuropathy TIAnegative neurological ROS  negative psych ROS   GI/Hepatic Neg liver ROS,   Endo/Other  diabetes, Well Controlled, Type 2, Oral Hypoglycemic Agents  Renal/GU negative Renal ROS  negative genitourinary   Musculoskeletal   Abdominal (+) + obese,   Peds  Hematology negative hematology ROS (+)   Anesthesia Other Findings   Reproductive/Obstetrics negative OB ROS                           Anesthesia Physical Anesthesia Plan  ASA: III  Anesthesia Plan: Spinal   Post-op Pain Management:    Induction:   Airway Management Planned:   Additional Equipment:   Intra-op Plan:   Post-operative Plan:   Informed Consent: I have reviewed the patients History and Physical, chart, labs and discussed the procedure including the risks, benefits and alternatives for the proposed anesthesia with the patient or authorized representative who has indicated his/her understanding and acceptance.     Plan Discussed with: CRNA and Surgeon  Anesthesia Plan Comments:       Anesthesia Quick Evaluation

## 2016-01-20 ENCOUNTER — Inpatient Hospital Stay (HOSPITAL_COMMUNITY): Payer: Medicare Other | Admitting: Certified Registered"

## 2016-01-20 ENCOUNTER — Inpatient Hospital Stay (HOSPITAL_COMMUNITY)
Admission: RE | Admit: 2016-01-20 | Discharge: 2016-01-23 | DRG: 470 | Disposition: A | Discharge status: Home / Self Care | Payer: Medicare Other | Source: Ambulatory Visit | Attending: Orthopedic Surgery | Admitting: Orthopedic Surgery

## 2016-01-20 ENCOUNTER — Encounter (HOSPITAL_COMMUNITY)
Admission: RE | Disposition: A | Discharge status: Home / Self Care | Payer: Self-pay | Source: Ambulatory Visit | Attending: Orthopedic Surgery

## 2016-01-20 ENCOUNTER — Inpatient Hospital Stay (HOSPITAL_COMMUNITY): Payer: Medicare Other | Admitting: Emergency Medicine

## 2016-01-20 DIAGNOSIS — M1711 Unilateral primary osteoarthritis, right knee: Principal | ICD-10-CM | POA: Diagnosis present

## 2016-01-20 DIAGNOSIS — I2510 Atherosclerotic heart disease of native coronary artery without angina pectoris: Secondary | ICD-10-CM | POA: Diagnosis not present

## 2016-01-20 DIAGNOSIS — Z888 Allergy status to other drugs, medicaments and biological substances status: Secondary | ICD-10-CM

## 2016-01-20 DIAGNOSIS — D649 Anemia, unspecified: Secondary | ICD-10-CM | POA: Diagnosis not present

## 2016-01-20 DIAGNOSIS — Z791 Long term (current) use of non-steroidal anti-inflammatories (NSAID): Secondary | ICD-10-CM | POA: Diagnosis not present

## 2016-01-20 DIAGNOSIS — I10 Essential (primary) hypertension: Secondary | ICD-10-CM | POA: Diagnosis present

## 2016-01-20 DIAGNOSIS — Z7984 Long term (current) use of oral hypoglycemic drugs: Secondary | ICD-10-CM

## 2016-01-20 DIAGNOSIS — D62 Acute posthemorrhagic anemia: Secondary | ICD-10-CM | POA: Diagnosis not present

## 2016-01-20 DIAGNOSIS — Z79899 Other long term (current) drug therapy: Secondary | ICD-10-CM

## 2016-01-20 DIAGNOSIS — Z7951 Long term (current) use of inhaled steroids: Secondary | ICD-10-CM

## 2016-01-20 DIAGNOSIS — E119 Type 2 diabetes mellitus without complications: Secondary | ICD-10-CM | POA: Diagnosis present

## 2016-01-20 DIAGNOSIS — M179 Osteoarthritis of knee, unspecified: Secondary | ICD-10-CM | POA: Diagnosis not present

## 2016-01-20 DIAGNOSIS — J449 Chronic obstructive pulmonary disease, unspecified: Secondary | ICD-10-CM | POA: Diagnosis present

## 2016-01-20 DIAGNOSIS — Z7982 Long term (current) use of aspirin: Secondary | ICD-10-CM | POA: Diagnosis not present

## 2016-01-20 DIAGNOSIS — K219 Gastro-esophageal reflux disease without esophagitis: Secondary | ICD-10-CM | POA: Diagnosis not present

## 2016-01-20 DIAGNOSIS — Z96659 Presence of unspecified artificial knee joint: Secondary | ICD-10-CM

## 2016-01-20 HISTORY — DX: Presence of unspecified artificial knee joint: Z96.659

## 2016-01-20 HISTORY — PX: TOTAL KNEE ARTHROPLASTY: SHX125

## 2016-01-20 LAB — GLUCOSE, CAPILLARY
Glucose-Capillary: 114 mg/dL — ABNORMAL HIGH (ref 65–99)
Glucose-Capillary: 110 mg/dL — ABNORMAL HIGH (ref 65–99)

## 2016-01-20 LAB — URINALYSIS, ROUTINE W REFLEX MICROSCOPIC
BILIRUBIN URINE: NEGATIVE
Glucose, UA: NEGATIVE mg/dL
HGB URINE DIPSTICK: NEGATIVE
KETONES UR: 15 mg/dL — AB
Leukocytes, UA: NEGATIVE
Nitrite: NEGATIVE
PROTEIN: NEGATIVE mg/dL
Specific Gravity, Urine: 1.024 (ref 1.005–1.030)
pH: 6 (ref 5.0–8.0)

## 2016-01-20 SURGERY — ARTHROPLASTY, KNEE, TOTAL
Anesthesia: Spinal | Site: Knee | Laterality: Right

## 2016-01-20 MED ORDER — LACTATED RINGERS IV SOLN: INTRAVENOUS | Status: DC

## 2016-01-20 MED ORDER — PHENOL 1.4 % MT LIQD
1.0000 | OROMUCOSAL | Status: DC | PRN
Start: 2016-01-20 — End: 2016-01-23

## 2016-01-20 MED ORDER — SODIUM CHLORIDE 0.9 % IV SOLN
INTRAVENOUS | Status: DC
  Administered 2016-01-20: 15:00:00 via INTRAVENOUS

## 2016-01-20 MED ORDER — FENOFIBRATE 160 MG PO TABS
160.0000 mg | ORAL_TABLET | Freq: Every day | ORAL | Status: DC
  Administered 2016-01-20 – 2016-01-23 (×4): 160 mg via ORAL
  Filled 2016-01-20 (×4): qty 1

## 2016-01-20 MED ORDER — CHLORHEXIDINE GLUCONATE 4 % EX LIQD: 60.0000 mL | Freq: Once | CUTANEOUS | Status: DC

## 2016-01-20 MED ORDER — MIDAZOLAM HCL 2 MG/2ML IJ SOLN
INTRAMUSCULAR | Status: AC
  Filled 2016-01-20: qty 2

## 2016-01-20 MED ORDER — TAMSULOSIN HCL 0.4 MG PO CAPS
0.4000 mg | ORAL_CAPSULE | Freq: Every day | ORAL | Status: DC
  Administered 2016-01-20 – 2016-01-22 (×3): 0.4 mg via ORAL
  Filled 2016-01-20 (×3): qty 1

## 2016-01-20 MED ORDER — ASPIRIN 325 MG PO TABS: 325.0000 mg | ORAL_TABLET | Freq: Every day | ORAL | Status: DC

## 2016-01-20 MED ORDER — DOCUSATE SODIUM 100 MG PO CAPS
100.0000 mg | ORAL_CAPSULE | Freq: Two times a day (BID) | ORAL | Status: DC
  Administered 2016-01-20 – 2016-01-23 (×6): 100 mg via ORAL
  Filled 2016-01-20 (×6): qty 1

## 2016-01-20 MED ORDER — METFORMIN HCL 500 MG PO TABS
750.0000 mg | ORAL_TABLET | Freq: Two times a day (BID) | ORAL | Status: DC
  Administered 2016-01-20 – 2016-01-23 (×6): 750 mg via ORAL
  Filled 2016-01-20 (×6): qty 2

## 2016-01-20 MED ORDER — FENTANYL CITRATE (PF) 250 MCG/5ML IJ SOLN
INTRAMUSCULAR | Status: AC
  Filled 2016-01-20: qty 5

## 2016-01-20 MED ORDER — BUPIVACAINE-EPINEPHRINE (PF) 0.5% -1:200000 IJ SOLN
INTRAMUSCULAR | Status: AC
  Filled 2016-01-20: qty 30

## 2016-01-20 MED ORDER — ACETAMINOPHEN 650 MG RE SUPP
650.0000 mg | Freq: Four times a day (QID) | RECTAL | Status: DC | PRN
Start: 2016-01-20 — End: 2016-01-23

## 2016-01-20 MED ORDER — ONDANSETRON HCL 4 MG/2ML IJ SOLN: 4.0000 mg | Freq: Four times a day (QID) | INTRAMUSCULAR | Status: DC | PRN

## 2016-01-20 MED ORDER — ASPIRIN EC 325 MG PO TBEC
325.0000 mg | DELAYED_RELEASE_TABLET | Freq: Every day | ORAL | Status: DC
  Administered 2016-01-20 – 2016-01-23 (×4): 325 mg via ORAL
  Filled 2016-01-20 (×4): qty 1

## 2016-01-20 MED ORDER — LOSARTAN POTASSIUM 50 MG PO TABS
75.0000 mg | ORAL_TABLET | Freq: Every day | ORAL | Status: DC
  Administered 2016-01-21 – 2016-01-23 (×3): 75 mg via ORAL
  Filled 2016-01-20 (×3): qty 1

## 2016-01-20 MED ORDER — HYDROMORPHONE HCL 1 MG/ML IJ SOLN: 0.2500 mg | INTRAMUSCULAR | Status: DC | PRN

## 2016-01-20 MED ORDER — SODIUM CHLORIDE 0.9 % IJ SOLN
INTRAMUSCULAR | Status: DC | PRN
  Administered 2016-01-20: 20 mL

## 2016-01-20 MED ORDER — PHENYLEPHRINE HCL 10 MG/ML IJ SOLN
INTRAMUSCULAR | Status: DC | PRN
  Administered 2016-01-20 (×3): 80 ug via INTRAVENOUS

## 2016-01-20 MED ORDER — SODIUM CHLORIDE 0.9 % IR SOLN
Status: DC | PRN
Start: 2016-01-20 — End: 2016-01-20
  Administered 2016-01-20: 1000 mL

## 2016-01-20 MED ORDER — CLOPIDOGREL BISULFATE 75 MG PO TABS
75.0000 mg | ORAL_TABLET | Freq: Every day | ORAL | Status: DC
  Administered 2016-01-20 – 2016-01-23 (×4): 75 mg via ORAL
  Filled 2016-01-20 (×4): qty 1

## 2016-01-20 MED ORDER — BUPIVACAINE-EPINEPHRINE (PF) 0.25% -1:200000 IJ SOLN
INTRAMUSCULAR | Status: AC
  Filled 2016-01-20: qty 30

## 2016-01-20 MED ORDER — DULOXETINE HCL 60 MG PO CPEP
60.0000 mg | ORAL_CAPSULE | Freq: Every day | ORAL | Status: DC
  Administered 2016-01-20 – 2016-01-23 (×4): 60 mg via ORAL
  Filled 2016-01-20 (×4): qty 1

## 2016-01-20 MED ORDER — LIDOCAINE HCL (CARDIAC) 20 MG/ML IV SOLN
INTRAVENOUS | Status: DC | PRN
  Administered 2016-01-20 (×2): 20 mg via INTRAVENOUS

## 2016-01-20 MED ORDER — ACETAMINOPHEN 500 MG PO TABS
1000.0000 mg | ORAL_TABLET | Freq: Once | ORAL | Status: AC
  Administered 2016-01-20: 1000 mg via ORAL
  Filled 2016-01-20: qty 2

## 2016-01-20 MED ORDER — ACETAMINOPHEN 325 MG PO TABS: 650.0000 mg | ORAL_TABLET | Freq: Four times a day (QID) | ORAL | Status: DC | PRN

## 2016-01-20 MED ORDER — MONTELUKAST SODIUM 10 MG PO TABS
10.0000 mg | ORAL_TABLET | Freq: Every day | ORAL | Status: DC
  Administered 2016-01-20 – 2016-01-22 (×3): 10 mg via ORAL
  Filled 2016-01-20 (×3): qty 1

## 2016-01-20 MED ORDER — PHENYLEPHRINE HCL 10 MG/ML IJ SOLN
10.0000 mg | INTRAVENOUS | Status: DC | PRN
  Administered 2016-01-20: 50 ug/min via INTRAVENOUS

## 2016-01-20 MED ORDER — METHOCARBAMOL 1000 MG/10ML IJ SOLN
500.0000 mg | Freq: Four times a day (QID) | INTRAVENOUS | Status: DC | PRN
  Filled 2016-01-20: qty 5

## 2016-01-20 MED ORDER — BUPIVACAINE-EPINEPHRINE 0.5% -1:200000 IJ SOLN
INTRAMUSCULAR | Status: DC | PRN
  Administered 2016-01-20: 30 mL

## 2016-01-20 MED ORDER — DIPHENHYDRAMINE HCL 12.5 MG/5ML PO ELIX: 12.5000 mg | ORAL_SOLUTION | ORAL | Status: DC | PRN

## 2016-01-20 MED ORDER — LIDOCAINE 2% (20 MG/ML) 5 ML SYRINGE
INTRAMUSCULAR | Status: AC
  Filled 2016-01-20: qty 5

## 2016-01-20 MED ORDER — KETOROLAC TROMETHAMINE 30 MG/ML IJ SOLN
30.0000 mg | Freq: Once | INTRAMUSCULAR | Status: DC
Start: 2016-01-20 — End: 2016-01-20

## 2016-01-20 MED ORDER — CEFAZOLIN SODIUM-DEXTROSE 2-3 GM-% IV SOLR
INTRAVENOUS | Status: DC | PRN
  Administered 2016-01-20: 2 g via INTRAVENOUS

## 2016-01-20 MED ORDER — SODIUM CHLORIDE 0.9 % IV SOLN: INTRAVENOUS | Status: DC

## 2016-01-20 MED ORDER — OXYCODONE HCL 5 MG PO TABS
5.0000 mg | ORAL_TABLET | ORAL | Status: DC | PRN
  Administered 2016-01-20 – 2016-01-23 (×13): 10 mg via ORAL
  Filled 2016-01-20 (×15): qty 2

## 2016-01-20 MED ORDER — ONDANSETRON HCL 4 MG PO TABS: 4.0000 mg | ORAL_TABLET | Freq: Four times a day (QID) | ORAL | Status: DC | PRN

## 2016-01-20 MED ORDER — HYDROMORPHONE HCL 1 MG/ML IJ SOLN
1.0000 mg | INTRAMUSCULAR | Status: DC | PRN
  Administered 2016-01-20 – 2016-01-22 (×11): 1 mg via INTRAVENOUS
  Filled 2016-01-20 (×11): qty 1

## 2016-01-20 MED ORDER — METOCLOPRAMIDE HCL 5 MG/ML IJ SOLN: 5.0000 mg | Freq: Three times a day (TID) | INTRAMUSCULAR | Status: DC | PRN

## 2016-01-20 MED ORDER — OXYCODONE HCL ER 10 MG PO T12A
10.0000 mg | EXTENDED_RELEASE_TABLET | Freq: Two times a day (BID) | ORAL | Status: DC
Start: 2016-01-20 — End: 2016-01-23
  Administered 2016-01-20 – 2016-01-23 (×6): 10 mg via ORAL
  Filled 2016-01-20 (×6): qty 1

## 2016-01-20 MED ORDER — MENTHOL 3 MG MT LOZG: 1.0000 | LOZENGE | OROMUCOSAL | Status: DC | PRN

## 2016-01-20 MED ORDER — METHOCARBAMOL 500 MG PO TABS
500.0000 mg | ORAL_TABLET | Freq: Four times a day (QID) | ORAL | Status: DC | PRN
  Administered 2016-01-20 – 2016-01-23 (×5): 500 mg via ORAL
  Filled 2016-01-20 (×5): qty 1

## 2016-01-20 MED ORDER — BUPROPION HCL ER (XL) 150 MG PO TB24
150.0000 mg | ORAL_TABLET | Freq: Every day | ORAL | Status: DC
  Administered 2016-01-20 – 2016-01-23 (×4): 150 mg via ORAL
  Filled 2016-01-20 (×4): qty 1

## 2016-01-20 MED ORDER — BUPIVACAINE IN DEXTROSE 0.75-8.25 % IT SOLN
INTRATHECAL | Status: DC | PRN
  Administered 2016-01-20: 2 mL via INTRATHECAL

## 2016-01-20 MED ORDER — PANTOPRAZOLE SODIUM 40 MG PO TBEC
40.0000 mg | DELAYED_RELEASE_TABLET | Freq: Every day | ORAL | Status: DC
  Administered 2016-01-20 – 2016-01-23 (×4): 40 mg via ORAL
  Filled 2016-01-20 (×4): qty 1

## 2016-01-20 MED ORDER — MEPERIDINE HCL 25 MG/ML IJ SOLN: 6.2500 mg | INTRAMUSCULAR | Status: DC | PRN

## 2016-01-20 MED ORDER — ZOLPIDEM TARTRATE 5 MG PO TABS
5.0000 mg | ORAL_TABLET | Freq: Every evening | ORAL | Status: DC | PRN
  Administered 2016-01-21: 5 mg via ORAL
  Filled 2016-01-20: qty 1

## 2016-01-20 MED ORDER — METOCLOPRAMIDE HCL 5 MG PO TABS: 5.0000 mg | ORAL_TABLET | Freq: Three times a day (TID) | ORAL | Status: DC | PRN

## 2016-01-20 MED ORDER — CELECOXIB 200 MG PO CAPS
200.0000 mg | ORAL_CAPSULE | Freq: Two times a day (BID) | ORAL | Status: DC
  Administered 2016-01-20 – 2016-01-23 (×6): 200 mg via ORAL
  Filled 2016-01-20 (×6): qty 1

## 2016-01-20 MED ORDER — BUPIVACAINE LIPOSOME 1.3 % IJ SUSP
20.0000 mL | INTRAMUSCULAR | Status: AC
  Administered 2016-01-20: 20 mL
  Filled 2016-01-20: qty 20

## 2016-01-20 MED ORDER — ONDANSETRON HCL 4 MG/2ML IJ SOLN
INTRAMUSCULAR | Status: DC | PRN
  Administered 2016-01-20: 4 mg via INTRAVENOUS

## 2016-01-20 MED ORDER — PROPOFOL 500 MG/50ML IV EMUL
INTRAVENOUS | Status: DC | PRN
  Administered 2016-01-20 (×2): 30 ug/kg/min via INTRAVENOUS

## 2016-01-20 MED ORDER — DILTIAZEM HCL ER COATED BEADS 120 MG PO CP24
120.0000 mg | ORAL_CAPSULE | Freq: Every day | ORAL | Status: DC
  Administered 2016-01-21 – 2016-01-23 (×3): 120 mg via ORAL
  Filled 2016-01-20 (×3): qty 1

## 2016-01-20 MED ORDER — ALUM & MAG HYDROXIDE-SIMETH 200-200-20 MG/5ML PO SUSP: 30.0000 mL | ORAL | Status: DC | PRN

## 2016-01-20 MED ORDER — FAMOTIDINE 20 MG PO TABS
10.0000 mg | ORAL_TABLET | Freq: Every day | ORAL | Status: DC
  Administered 2016-01-20 – 2016-01-23 (×4): 10 mg via ORAL
  Filled 2016-01-20 (×5): qty 1

## 2016-01-20 MED ORDER — ONDANSETRON HCL 4 MG/2ML IJ SOLN: 4.0000 mg | Freq: Once | INTRAMUSCULAR | Status: DC | PRN

## 2016-01-20 MED ORDER — TRANEXAMIC ACID 1000 MG/10ML IV SOLN
1000.0000 mg | Freq: Once | INTRAVENOUS | Status: AC
  Administered 2016-01-20: 1000 mg via INTRAVENOUS
  Filled 2016-01-20: qty 10

## 2016-01-20 MED ORDER — MIDAZOLAM HCL 5 MG/5ML IJ SOLN
INTRAMUSCULAR | Status: DC | PRN
  Administered 2016-01-20: 2 mg via INTRAVENOUS

## 2016-01-20 MED ORDER — OXYCODONE HCL 5 MG PO TABS
ORAL_TABLET | ORAL | Status: AC
  Administered 2016-01-20: 10 mg via ORAL
  Filled 2016-01-20: qty 2

## 2016-01-20 MED ORDER — CEFAZOLIN SODIUM 1-5 GM-% IV SOLN
1.0000 g | Freq: Four times a day (QID) | INTRAVENOUS | Status: AC
  Administered 2016-01-20 (×2): 1 g via INTRAVENOUS
  Filled 2016-01-20 (×2): qty 50

## 2016-01-20 MED ORDER — ONDANSETRON HCL 4 MG/2ML IJ SOLN
INTRAMUSCULAR | Status: AC
  Filled 2016-01-20: qty 2

## 2016-01-20 MED ORDER — LACTATED RINGERS IV SOLN
INTRAVENOUS | Status: DC | PRN
  Administered 2016-01-20 (×2): via INTRAVENOUS

## 2016-01-20 MED ORDER — PROPOFOL 10 MG/ML IV BOLUS
INTRAVENOUS | Status: DC | PRN
  Administered 2016-01-20: 10 mg via INTRAVENOUS

## 2016-01-20 MED ORDER — PROPOFOL 500 MG/50ML IV EMUL: INTRAVENOUS | Status: DC | PRN

## 2016-01-20 MED ORDER — 0.9 % SODIUM CHLORIDE (POUR BTL) OPTIME
TOPICAL | Status: DC | PRN
  Administered 2016-01-20: 1000 mL

## 2016-01-20 SURGICAL SUPPLY — 61 items
BANDAGE ACE 6X5 VEL STRL LF (GAUZE/BANDAGES/DRESSINGS) ×2 IMPLANT
BANDAGE ESMARK 6X9 LF (GAUZE/BANDAGES/DRESSINGS) ×1 IMPLANT
BENZOIN TINCTURE PRP APPL 2/3 (GAUZE/BANDAGES/DRESSINGS) ×2 IMPLANT
BLADE SAGITTAL 13X1.27X60 (BLADE) ×2 IMPLANT
BLADE SAW SGTL 83.5X18.5 (BLADE) ×2 IMPLANT
BLADE SURG 10 STRL SS (BLADE) ×2 IMPLANT
BNDG ESMARK 6X9 LF (GAUZE/BANDAGES/DRESSINGS) ×2
BOWL SMART MIX CTS (DISPOSABLE) ×2 IMPLANT
CAPT KNEE TOTAL 3 ×2 IMPLANT
CEMENT BONE SIMPLEX SPEEDSET (Cement) ×4 IMPLANT
CLSR STERI-STRIP ANTIMIC 1/2X4 (GAUZE/BANDAGES/DRESSINGS) ×2 IMPLANT
COVER SURGICAL LIGHT HANDLE (MISCELLANEOUS) ×2 IMPLANT
CUFF TOURNIQUET SINGLE 34IN LL (TOURNIQUET CUFF) ×2 IMPLANT
DRAPE EXTREMITY T 121X128X90 (DRAPE) ×2 IMPLANT
DRAPE INCISE IOBAN 66X45 STRL (DRAPES) ×6 IMPLANT
DRAPE PROXIMA HALF (DRAPES) IMPLANT
DRAPE U-SHAPE 47X51 STRL (DRAPES) ×2 IMPLANT
DRSG ADAPTIC 3X8 NADH LF (GAUZE/BANDAGES/DRESSINGS) ×2 IMPLANT
DRSG AQUACEL AG ADV 3.5X10 (GAUZE/BANDAGES/DRESSINGS) ×2 IMPLANT
DRSG PAD ABDOMINAL 8X10 ST (GAUZE/BANDAGES/DRESSINGS) ×2 IMPLANT
DURAPREP 26ML APPLICATOR (WOUND CARE) ×4 IMPLANT
ELECT REM PT RETURN 9FT ADLT (ELECTROSURGICAL) ×2
ELECTRODE REM PT RTRN 9FT ADLT (ELECTROSURGICAL) ×1 IMPLANT
GAUZE SPONGE 4X4 12PLY STRL (GAUZE/BANDAGES/DRESSINGS) ×2 IMPLANT
GLOVE BIOGEL M 7.0 STRL (GLOVE) IMPLANT
GLOVE BIOGEL PI IND STRL 7.5 (GLOVE) IMPLANT
GLOVE BIOGEL PI IND STRL 8.5 (GLOVE) ×5 IMPLANT
GLOVE BIOGEL PI INDICATOR 7.5 (GLOVE)
GLOVE BIOGEL PI INDICATOR 8.5 (GLOVE) ×5
GLOVE SURG ORTHO 8.0 STRL STRW (GLOVE) ×12 IMPLANT
GOWN STRL REUS W/ TWL LRG LVL3 (GOWN DISPOSABLE) ×1 IMPLANT
GOWN STRL REUS W/ TWL XL LVL3 (GOWN DISPOSABLE) ×2 IMPLANT
GOWN STRL REUS W/TWL 2XL LVL3 (GOWN DISPOSABLE) ×2 IMPLANT
GOWN STRL REUS W/TWL LRG LVL3 (GOWN DISPOSABLE) ×1
GOWN STRL REUS W/TWL XL LVL3 (GOWN DISPOSABLE) ×2
HANDPIECE INTERPULSE COAX TIP (DISPOSABLE) ×1
HOOD PEEL AWAY FACE SHEILD DIS (HOOD) ×6 IMPLANT
KIT BASIN OR (CUSTOM PROCEDURE TRAY) ×2 IMPLANT
KIT ROOM TURNOVER OR (KITS) ×2 IMPLANT
KNEE CAPITATED TOTAL 3 ×1 IMPLANT
MANIFOLD NEPTUNE II (INSTRUMENTS) ×2 IMPLANT
NEEDLE 22X1 1/2 (OR ONLY) (NEEDLE) ×4 IMPLANT
NS IRRIG 1000ML POUR BTL (IV SOLUTION) ×2 IMPLANT
PACK TOTAL JOINT (CUSTOM PROCEDURE TRAY) ×2 IMPLANT
PACK UNIVERSAL I (CUSTOM PROCEDURE TRAY) ×2 IMPLANT
PAD ARMBOARD 7.5X6 YLW CONV (MISCELLANEOUS) ×4 IMPLANT
PADDING CAST COTTON 6X4 STRL (CAST SUPPLIES) ×2 IMPLANT
SET HNDPC FAN SPRY TIP SCT (DISPOSABLE) ×1 IMPLANT
STAPLER VISISTAT 35W (STAPLE) ×2 IMPLANT
SUCTION FRAZIER HANDLE 10FR (MISCELLANEOUS) ×1
SUCTION TUBE FRAZIER 10FR DISP (MISCELLANEOUS) ×1 IMPLANT
SUT BONE WAX W31G (SUTURE) ×2 IMPLANT
SUT VIC AB 0 CTB1 27 (SUTURE) ×4 IMPLANT
SUT VIC AB 1 CT1 27 (SUTURE) ×2
SUT VIC AB 1 CT1 27XBRD ANBCTR (SUTURE) ×2 IMPLANT
SUT VIC AB 2-0 CT1 27 (SUTURE) ×2
SUT VIC AB 2-0 CT1 TAPERPNT 27 (SUTURE) ×2 IMPLANT
SYR 20CC LL (SYRINGE) ×4 IMPLANT
TOWEL OR 17X24 6PK STRL BLUE (TOWEL DISPOSABLE) ×2 IMPLANT
TOWEL OR 17X26 10 PK STRL BLUE (TOWEL DISPOSABLE) ×2 IMPLANT
WATER STERILE IRR 1000ML POUR (IV SOLUTION) ×4 IMPLANT

## 2016-01-20 NOTE — Anesthesia Procedure Notes (Signed)
Spinal Patient location during procedure: OR Start time: 01/20/2016 7:38 AM End time: 01/20/2016 7:41 AM Staffing Anesthesiologist: Lyn Hollingshead Performed by: anesthesiologist  Preanesthetic Checklist Completed: patient identified, surgical consent, pre-op evaluation, timeout performed, IV checked, risks and benefits discussed and monitors and equipment checked Spinal Block Patient position: sitting Prep: site prepped and draped and DuraPrep Patient monitoring: heart rate, cardiac monitor, continuous pulse ox and blood pressure Approach: midline Location: L2-3 Injection technique: single-shot Needle Needle type: Sprotte  Needle gauge: 24 G Needle length: 9 cm Needle insertion depth: 6 cm Assessment Sensory level: T8

## 2016-01-20 NOTE — Addendum Note (Signed)
Addendum  created 01/20/16 1107 by Freddie Breech, CRNA   Modules edited: Charges VN

## 2016-01-20 NOTE — Evaluation (Signed)
Physical Therapy Evaluation Patient Details Name: Barbara French MRN: [id number] DOB: 1956/01/27 Today's Date: 01/20/2016   History of Present Illness  Pt is a 60 y.o. female now s/p Rt TKA. PMH: diabetes, depression, hypertension, CAD, TIA, PE, neuropathy, MI, COPD.   Clinical Impression  Pt seen for initial evaluation and treatment. Pt able to ambulate 25 ft with min guard and rw. Pt is very lethargic and needing frequent cues to stay awake throughout session. Pt becoming more alert once sitting EOB. Patient's daughter is present throughout session and reporting that the patient is going to be home with her grandchildren (35 and 65 y.o.) once released from the hospital. Unable to identify if any other assistance is available if needed. Pt will need to be as independent as possible at D/C.     Follow Up Recommendations Home health PT;Supervision for mobility/OOB    Equipment Recommendations  Rolling walker with 5" wheels    Recommendations for Other Services       Precautions / Restrictions Precautions Precautions: Knee;Fall Precaution Booklet Issued: Yes (comment) Precaution Comments: HEP provided, reviewed knee extension precautions Restrictions Weight Bearing Restrictions: Yes RLE Weight Bearing: Weight bearing as tolerated      Mobility  Bed Mobility Overal bed mobility: Needs Assistance Bed Mobility: Supine to Sit     Supine to sit: Min guard;HOB elevated     General bed mobility comments: HOB elevated and using rail to assist.   Transfers Overall transfer level: Needs assistance Equipment used: Rolling walker (2 wheeled) Transfers: Sit to/from Stand Sit to Stand: Min assist         General transfer comment: min assist for stability with initial standing. Cues needed for hand placement.   Ambulation/Gait Ambulation/Gait assistance: Min guard Ambulation Distance (Feet): 25 Feet Assistive device: Rolling walker (2 wheeled) Gait Pattern/deviations: Step-to  pattern;Decreased weight shift to right Gait velocity: decreased.   General Gait Details: cues for sequence and correct use of rw.   Stairs            Wheelchair Mobility    Modified Rankin (Stroke Patients Only)       Balance Overall balance assessment: Needs assistance Sitting-balance support: No upper extremity supported Sitting balance-Leahy Scale: Good     Standing balance support: Bilateral upper extremity supported Standing balance-Leahy Scale: Poor Standing balance comment: using rw for support                             Pertinent Vitals/Pain Pain Assessment: 0-10 Pain Score: 10-Worst pain ever Pain Location: Rt knee Pain Descriptors / Indicators: Aching;Sharp Pain Intervention(s): Limited activity within patient's tolerance;Monitored during session;Repositioned    Home Living Family/patient expects to be discharged to:: Private residence Living Arrangements: Spouse/significant other Available Help at Discharge: Family;Available PRN/intermittently Type of Home: House Home Access: Stairs to enter Entrance Stairs-Rails:  (none from garage, rails present from side stairs. ) Entrance Stairs-Number of Steps: 3-4 Home Layout: Multi-level;Able to live on main level with bedroom/bathroom Home Equipment: Kasandra Knudsen - single point Additional Comments: Pt reports that she will be alone during the day when she returns home. Her 8 and 2 y.o. grandchildren will be staying with her. She will have her significant other in the evenings. Daughter reports that the pt is planning to stay on the main level and sleep in the recliner. There is a bathroom on the main floor.      Prior Function Level of Independence: Independent  Hand Dominance        Extremity/Trunk Assessment   Upper Extremity Assessment: Overall WFL for tasks assessed           Lower Extremity Assessment: RLE deficits/detail RLE Deficits / Details: able to perform SLR  with lag.        Communication   Communication: No difficulties  Cognition Arousal/Alertness: Lethargic;Suspect due to medications Behavior During Therapy: [facility] for tasks assessed/performed Overall Cognitive Status: Within Functional Limits for tasks assessed                      General Comments General comments (skin integrity, edema, etc.): Pt very lethargic, having to be awoken multiple times during session. Bloody drainage noted from incision following ambulation, nursing notified. No further drainage noted once sitting.     Exercises        Assessment/Plan    PT Assessment Patient needs continued PT services  PT Diagnosis Difficulty walking   PT Problem List Decreased strength;Decreased range of motion;Decreased activity tolerance;Decreased balance;Decreased mobility  PT Treatment Interventions DME instruction;Gait training;Stair training;Functional mobility training;Therapeutic activities;Therapeutic exercise;Balance training;Patient/family education   PT Goals (Current goals can be found in the Care Plan section) Acute Rehab PT Goals Patient Stated Goal: go home from the hospital PT Goal Formulation: With patient Time For Goal Achievement: 02/03/16 Potential to Achieve Goals: Good    Frequency 7X/week   Barriers to discharge        Co-evaluation               End of Session Equipment Utilized During Treatment: Gait belt Activity Tolerance: Patient tolerated treatment well Patient left: in chair;with family/visitor present;with call bell/phone within reach (in knee extension) Nurse Communication: Mobility status;Weight bearing status         Time: 3382-5053 PT Time Calculation (min) (ACUTE ONLY): 35 min   Charges:   PT Evaluation $PT Eval Moderate Complexity: 1 Procedure PT Treatments $Therapeutic Activity: 8-22 mins   PT G Codes:        Cassell Clement, PT, CSCS Pager [phone] Office [phone]  01/20/2016, 3:09 PM

## 2016-01-20 NOTE — Progress Notes (Signed)
Orthopedic Tech Progress Note Patient Details:  Barbara French March 03, 1956 [id number]  CPM Right Knee CPM Right Knee: On Right Knee Flexion (Degrees): 90 Right Knee Extension (Degrees): 0 Additional Comments: Trapeze bar and foot roll   [name] 01/20/2016, 10:17 AM
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
## 2016-01-20 NOTE — Anesthesia Postprocedure Evaluation (Addendum)
Anesthesia Post Note  Patient: Madline L Klauer  Procedure(s) Performed: Procedure(s) (LRB): RIGHT TOTAL KNEE ARTHROPLASTY (Right)  Patient location during evaluation: PACU Level of consciousness: sedated Pain management: pain level controlled Vital Signs Assessment: post-procedure vital signs reviewed and stable Respiratory status: spontaneous breathing Cardiovascular status: stable Postop Assessment: no headache, no backache, spinal receding, patient able to bend at knees and no signs of nausea or vomiting Anesthetic complications: no     Last Vitals:  Filed Vitals:   01/20/16 0935 01/20/16 0945  BP: 102/54 101/53  Pulse: 91 91  Temp: 36.6 C   Resp: 19 20    Last Pain:  Filed Vitals:   01/20/16 0959  PainSc: Asleep   Pain Goal:                 Nihira Puello JR,JOHN Tiaja Hagan

## 2016-01-20 NOTE — Op Note (Signed)
TOTAL KNEE REPLACEMENT OPERATIVE NOTE:  01/20/2016  4:22 PM  PATIENT:  Barbara French  60 y.o. female  PRE-OPERATIVE DIAGNOSIS:  primary osteoarthritis right knee  POST-OPERATIVE DIAGNOSIS:  primary osteoarthritis right knee  PROCEDURE:  Procedure(s): RIGHT TOTAL KNEE ARTHROPLASTY  SURGEON:  Surgeon(s): Vickey Huger, MD  PHYSICIAN ASSISTANT: Carlyon Shadow, [facility]  ANESTHESIA:   spinal  DRAINS: Hemovac  SPECIMEN: None  COUNTS:  Correct  TOURNIQUET:   Total Tourniquet Time Documented: Thigh (Right) - 57 minutes Total: Thigh (Right) - 57 minutes   DICTATION:  Indication for procedure:    The patient is a 60 y.o. female who has failed conservative treatment for primary osteoarthritis right knee.  Informed consent was obtained prior to anesthesia. The risks versus benefits of the operation were explain and in a way the patient can, and did, understand.   On the implant demand matching protocol, this patient scored 10.  Therefore, this patient did" "did not receive a polyethylene insert with vitamin E which is a high demand implant.  Description of procedure:     The patient was taken to the operating room and placed under anesthesia.  The patient was positioned in the usual fashion taking care that all body parts were adequately padded and/or protected.  I foley catheter was not placed.  A tourniquet was applied and the leg prepped and draped in the usual sterile fashion.  The extremity was exsanguinated with the esmarch and tourniquet inflated to 350 mmHg.  Pre-operative range of motion was normal.  The knee was in 5 degree of mild valgus.  A midline incision approximately 6-7 inches long was made with a #10 blade.  A new blade was used to make a parapatellar arthrotomy going 2-3 cm into the quadriceps tendon, over the patella, and alongside the medial aspect of the patellar tendon.  A synovectomy was then performed with the #10 blade and forceps. I then elevated the deep MCL off  the medial tibial metaphysis subperiosteally around to the semimembranosus attachment.    I everted the patella and used calipers to measure patellar thickness.  I used the reamer to ream down to appropriate thickness to recreate the native thickness.  I then removed excess bone with the rongeur and sagittal saw.  I used the appropriately sized template and drilled the three lug holes.  I then put the trial in place and measured the thickness with the calipers to ensure recreation of the native thickness.  The trial was then removed and the patella subluxed and the knee brought into flexion.  A homan retractor was place to retract and protect the patella and lateral structures.  A Z-retractor was place medially to protect the medial structures.  The extra-medullary alignment system was used to make cut the tibial articular surface perpendicular to the anamotic axis of the tibia and in 3 degrees of posterior slope.  The cut surface and alignment jig was removed.  I then used the intramedullary alignment guide to make a 6 valgus cut on the distal femur.  I then marked out the epicondylar axis on the distal femur.  The posterior condylar axis measured 3 degrees.  I then used the anterior referencing sizer and measured the femur to be a size 7.  The 4-In-1 cutting block was screwed into place in external rotation matching the posterior condylar angle, making our cuts perpendicular to the epicondylar axis.  Anterior, posterior and chamfer cuts were made with the sagittal saw.  The cutting block and cut  pieces were removed.  A lamina spreader was placed in 90 degrees of flexion.  The ACL, PCL, menisci, and posterior condylar osteophytes were removed.  A 10 mm spacer blocked was found to offer good flexion and extension gap balance after minimal in degree releasing.   The scoop retractor was then placed and the femoral finishing block was pinned in place.  The small sagittal saw was used as well as the lug drill to  finish the femur.  The block and cut surfaces were removed and the medullary canal hole filled with autograft bone from the cut pieces.  The tibia was delivered forward in deep flexion and external rotation.  A size D tray was selected and pinned into place centered on the medial 1/3 of the tibial tubercle.  The reamer and keel was used to prepare the tibia through the tray.    I then trialed with the size 7 femur, size D tibia, a 10 mm insert and the 32 patella.  I had excellent flexion/extension gap balance, excellent patella tracking.  Flexion was full and beyond 120 degrees; extension was zero.  These components were chosen and the staff opened them to me on the back table while the knee was lavaged copiously and the cement mixed.  The soft tissue was infiltrated with 60cc of exparel 1.3% through a 21 gauge needle.  I cemented in the components and removed all excess cement.  The polyethylene tibial component was snapped into place and the knee placed in extension while cement was hardening.  The capsule was infilltrated with 30cc of .25% Marcaine with epinephrine.  A hemovac was place in the joint exiting superolaterally.  A pain pump was place superomedially superficial to the arthrotomy.  Once the cement was hard, the tourniquet was let down.  Hemostasis was obtained.  The arthrotomy was closed with figure-8 #1 vicryl sutures.  The deep soft tissues were closed with #0 vicryls and the subcuticular layer closed with a running #2-0 vicryl.  The skin was reapproximated and closed with skin staples.  The wound was dressed with xeroform, 4 x4's, 2 ABD sponges, a single layer of webril and a TED stocking.   The patient was then awakened, extubated, and taken to the recovery room in stable condition.  BLOOD LOSS:  300cc DRAINS: 1 hemovac, 1 pain catheter COMPLICATIONS:  None.  PLAN OF CARE: Admit to inpatient   PATIENT DISPOSITION:  PACU - hemodynamically stable.   Delay start of Pharmacological  VTE agent (>24hrs) due to surgical blood loss or risk of bleeding:  not applicable  Please fax a copy of this op note to my office at [phone] (please only include page 1 and 2 of the Case Information op note)

## 2016-01-20 NOTE — Transfer of Care (Signed)
Immediate Anesthesia Transfer of Care Note  Patient: Barbara French  Procedure(s) Performed: Procedure(s): RIGHT TOTAL KNEE ARTHROPLASTY (Right)  Patient Location: PACU  Anesthesia Type:Spinal  Level of Consciousness:  sedated, patient cooperative and responds to stimulation  Airway & Oxygen Therapy:Patient Spontanous Breathing and Patient connected to face mask oxgen  Post-op Assessment:  Report given to PACU RN and Post -op Vital signs reviewed and stable  Post vital signs:  Reviewed and stable  Last Vitals:  Filed Vitals:   01/20/16 0607 01/20/16 0935  BP: 119/65   Pulse: 104 91  Temp: 36.9 C   Resp: 20 19    Complications: No apparent anesthesia complications

## 2016-01-21 ENCOUNTER — Encounter (HOSPITAL_COMMUNITY): Payer: Self-pay | Admitting: Orthopedic Surgery

## 2016-01-21 LAB — CBC
HCT: 38.1 % (ref 36.0–46.0)
HEMOGLOBIN: 12 g/dL (ref 12.0–15.0)
MCH: 29.5 pg (ref 26.0–34.0)
MCHC: 31.5 g/dL (ref 30.0–36.0)
MCV: 93.6 fL (ref 78.0–100.0)
Platelets: 216 10*3/uL (ref 150–400)
RBC: 4.07 MIL/uL (ref 3.87–5.11)
RDW: 13.9 % (ref 11.5–15.5)
WBC: 11.4 10*3/uL — ABNORMAL HIGH (ref 4.0–10.5)

## 2016-01-21 LAB — GLUCOSE, CAPILLARY
GLUCOSE-CAPILLARY: 128 mg/dL — AB (ref 65–99)
GLUCOSE-CAPILLARY: 140 mg/dL — AB (ref 65–99)
GLUCOSE-CAPILLARY: 138 mg/dL — AB (ref 65–99)

## 2016-01-21 LAB — BASIC METABOLIC PANEL
Anion gap: 6 (ref 5–15)
BUN: 6 mg/dL (ref 6–20)
CHLORIDE: 99 mmol/L — AB (ref 101–111)
CO2: 25 mmol/L (ref 22–32)
CREATININE: 0.64 mg/dL (ref 0.44–1.00)
Calcium: 8.3 mg/dL — ABNORMAL LOW (ref 8.9–10.3)
GFR calc non Af Amer: >60 mL/min (ref >60)
Glucose, Bld: 96 mg/dL (ref 65–99)
POTASSIUM: 4.4 mmol/L (ref 3.5–5.1)
SODIUM: 130 mmol/L — AB (ref 135–145)

## 2016-01-21 NOTE — Progress Notes (Addendum)
Physical Therapy Treatment Patient Details Name: Barbara French MRN: [id number] DOB: 03-Apr-1956 Today's Date: 01/21/2016    History of Present Illness Pt is a 60 y.o. female now s/p Rt TKA. PMH: diabetes, depression, hypertension, CAD, TIA, PE, neuropathy, MI, COPD.     PT Comments    Patient is making gradual progress toward mobility goals. Continues to c/o 10/10 pain with mobility despite premedication and was able to ambulate 61f with increased time and encouragement. Continued HEP this session. Patient needs to practice stairs next session.   Pt declined [facility] stay for further rehab and prefers to return home with HHPT.   Follow Up Recommendations  Home health PT;Supervision for mobility/OOB     Equipment Recommendations  Rolling walker with 5" wheels;3in1 (PT)    Recommendations for Other Services       Precautions / Restrictions Precautions Precautions: Knee;Fall Precaution Booklet Issued: Yes (comment) Precaution Comments: HEP provided, reviewed knee extension precautions Restrictions Weight Bearing Restrictions: Yes RLE Weight Bearing: Weight bearing as tolerated    Mobility  Bed Mobility               General bed mobility comments: OOB in chair upon arrival  Transfers Overall transfer level: Needs assistance Equipment used: Rolling walker (2 wheeled) Transfers: Sit to/from Stand Sit to Stand: Min guard         General transfer comment: min guard for safety; cues for hand placement and technique  Ambulation/Gait Ambulation/Gait assistance: Min guard Ambulation Distance (Feet): 65 Feet Assistive device: Rolling walker (2 wheeled) Gait Pattern/deviations: Step-through pattern;Decreased step length - left;Decreased stance time - right;Decreased weight shift to right;Antalgic Gait velocity: decreased.   General Gait Details: cues for posture, safe use of AD, and encouraged to increase weightbearing on R LE; no knee instability noted but pt with  slightly flexed knee wiht ambulation   Stairs            Wheelchair Mobility    Modified Rankin (Stroke Patients Only)       Balance Overall balance assessment: Needs assistance Sitting-balance support: No upper extremity supported;Feet supported Sitting balance-Leahy Scale: Good     Standing balance support: Bilateral upper extremity supported;Single extremity supported;During functional activity Standing balance-Leahy Scale: Poor                      Cognition Arousal/Alertness: Lethargic;Suspect due to medications Behavior During Therapy: [facility] tasks assessed/performed Overall Cognitive Status: Within Functional Limits for tasks assessed                      Exercises Total Joint Exercises Heel Slides: Strengthening;Right;10 reps;Seated Long Arc Quad: Strengthening;Right;10 reps;Seated Knee Flexion: AROM;Right;10 reps;Seated Goniometric ROM: ~80    General Comments General comments (skin integrity, edema, etc.): pt continues to demo lethargy intermittently throughout session and requires vc to stay task focused      Pertinent Vitals/Pain Pain Assessment: 0-10 Pain Score: 6  Pain Location: R knee and back Pain Descriptors / Indicators: Aching;Sore Pain Intervention(s): Monitored during session;Premedicated before session;Repositioned    Home Living Family/patient expects to be discharged to:: Private residence Living Arrangements: Spouse/significant other Available Help at Discharge: Family;Available PRN/intermittently Type of Home: House Home Access: Stairs to enter   Home Layout: Multi-level;Able to live on main level with bedroom/bathroom Home Equipment: CKasandra Knudsen- single point      Prior Function Level of Independence: Independent          PT Goals (current goals  can now be found in the care plan section) Acute Rehab PT Goals Patient Stated Goal: get better PT Goal Formulation: With patient Time For Goal Achievement:  02/03/16 Potential to Achieve Goals: Good Progress towards PT goals: Progressing toward goals    Frequency  7X/week    PT Plan Discharge plan needs to be updated    Co-evaluation             End of Session Equipment Utilized During Treatment: Gait belt Activity Tolerance: Patient tolerated treatment well Patient left: in chair;with call bell/phone within reach (in knee extension)     Time: 0867-6195 PT Time Calculation (min) (ACUTE ONLY): 39 min  Charges:  $Gait Training: 8-22 mins $Therapeutic Exercise: 8-22 mins $Therapeutic Activity: 8-22 mins                    G Codes:      Salina April, PTA Pager: [phone]   01/21/2016, 3:17 PM

## 2016-01-21 NOTE — Progress Notes (Signed)
Orthopedic Tech Progress Note Patient Details:  MATTHEW PAIS [birth year] [id number]  Patient ID: [name], female   DOB: 1955-12-18, 60 y.o.   MRN: [id number] i put pt in cpm 0-40 and pt had so much pain she was crying and asked me to take her out so i did.  [name] 01/21/2016, 5:59 AM

## 2016-01-21 NOTE — Care Management Important Message (Signed)
Important Message  Patient Details  Name: Barbara French MRN: [id number] Date of Birth: 1956-06-23   Medicare Important Message Given:  Yes    [name] 01/21/2016, 8:49 AM

## 2016-01-21 NOTE — Progress Notes (Signed)
SPORTS MEDICINE AND JOINT REPLACEMENT  Lara Mulch, MD   [facility] [place], [place], [place]  [address]                             [phone]   PROGRESS NOTE  Subjective:  negative for Chest Pain  negative for Shortness of Breath  negative for Nausea/Vomiting   negative for Calf Pain  negative for Bowel Movement   Tolerating Diet: yes         Patient reports pain as 7 on 0-10 scale.    Objective: Vital signs in last 24 hours:   Patient Vitals for the past 24 hrs:  BP Temp Temp src Pulse Resp SpO2  01/21/16 0428 113/72 mmHg 98 F (36.7 C) Oral 100 20 94 %  01/20/16 2356 (!) 191/83 mmHg 98.9 F (37.2 C) Oral 100 18 92 %  01/20/16 2050 (!) 171/76 mmHg 98.5 F (36.9 C) Oral (!) 101 16 92 %  01/20/16 1300 96/60 mmHg 97.3 F (36.3 C) Oral 81 16 99 %  01/20/16 1218 (!) 92/58 mmHg 97.3 F (36.3 C) Oral 80 16 99 %  01/20/16 1202 - - - 80 15 95 %  01/20/16 1200 - 97.9 F (36.6 C) - 80 16 96 %  01/20/16 1152 94/61 mmHg - - 79 14 95 %  01/20/16 1130 (!) 94/53 mmHg - - 80 14 96 %  01/20/16 1115 - - - 83 15 96 %  01/20/16 1100 (!) 107/56 mmHg - - 85 15 96 %  01/20/16 1045 107/67 mmHg - - 89 15 95 %  01/20/16 1030 (!) 97/58 mmHg - - 89 18 94 %  01/20/16 1015 (!) 90/52 mmHg - - 96 19 95 %  01/20/16 1000 (!) 99/57 mmHg - - 92 17 96 %  01/20/16 0945 (!) 101/53 mmHg - - 91 20 99 %  01/20/16 0935 (!) 102/54 mmHg 97.9 F (36.6 C) - 91 19 99 %    '@flow'$ {1959:LAST@   Intake/Output from previous day:   06/12 0701 - 06/13 0700 In: 1820 [P.O.:120; I.V.:1700] Out: 775 [Urine:700]   Intake/Output this shift:       Intake/Output      06/12 0701 - 06/13 0700 06/13 0701 - 06/14 0700   P.O. 120    I.V. (mL/kg) 1700 (21.9)    Total Intake(mL/kg) 1820 (23.5)    Urine (mL/kg/hr) 700 (0.4)    Blood 75 (0)    Total Output 775     Net +1045          Urine Occurrence 3 x       LABORATORY DATA:  Recent Labs  01/21/16 0524  WBC 11.4*  HGB 12.0  HCT 38.1   PLT 216    Recent Labs  01/21/16 0524  NA 130*  K 4.4  CL 99*  CO2 25  BUN 6  CREATININE 0.64  GLUCOSE 96  CALCIUM 8.3*   Lab Results  Component Value Date   INR 0.95 01/09/2016   INR 1.0 09/05/2008   INR 0.9 RATIO 02/28/2008    Examination:  General appearance: alert, cooperative and no distress Extremities: extremities normal, atraumatic, no cyanosis or edema  Wound Exam: clean, dry, intact   Drainage:  None: wound tissue dry  Motor Exam: Quadriceps and Hamstrings Intact  Sensory Exam: Superficial Peroneal, Deep Peroneal and Tibial normal   Assessment:    1 Day Post-Op  Procedure(s) (  LRB): RIGHT TOTAL KNEE ARTHROPLASTY (Right)  ADDITIONAL DIAGNOSIS:  Active Problems:   S/P total knee replacement  Acute Blood Loss Anemia   Plan: Physical Therapy as ordered Weight Bearing as Tolerated (WBAT)  DVT Prophylaxis:  Lovenox  DISCHARGE PLAN: Home  DISCHARGE NEEDS: HHPT            Donia Ast 01/21/2016, 7:02 AM

## 2016-01-21 NOTE — Progress Notes (Signed)
Orthopedic Tech Progress Note Patient Details:  Barbara French 1955/12/27 [id number]  CPM Right Knee CPM Right Knee: On Right Knee Flexion (Degrees): 90 Right Knee Extension (Degrees): 0 Additional Comments: [place] [name] 01/21/2016, 2:36 PM

## 2016-01-21 NOTE — Progress Notes (Signed)
Physical Therapy Treatment Patient Details Name: Barbara French MRN: [id number] DOB: 1956-04-12 Today's Date: 01/21/2016    History of Present Illness Pt is a 61 y.o. female now s/p Rt TKA. PMH: diabetes, depression, hypertension, CAD, TIA, PE, neuropathy, MI, COPD.     PT Comments    Patient limited by pain this session. Min A for safe ambulation of 13f X2. Patient lethargic at times with cues needed to attend to tasks. Pt is planning for 865and 142year old grandchildren to stay with her upon d/c and has no other assist available until her boyfriend returns home late in the evening. Due to pt's decreased assistance at home, recommending SNF for further skilled PT services to reach mod I level before d/c home.   Follow Up Recommendations  SNF     Equipment Recommendations  Rolling walker with 5" wheels    Recommendations for Other Services       Precautions / Restrictions Precautions Precautions: Knee;Fall Precaution Booklet Issued: Yes (comment) Precaution Comments: HEP provided, reviewed knee extension precautions Restrictions Weight Bearing Restrictions: Yes RLE Weight Bearing: Weight bearing as tolerated    Mobility  Bed Mobility Overal bed mobility: Needs Assistance Bed Mobility: Supine to Sit     Supine to sit: Supervision;Min guard     General bed mobility comments: min guard when lowering R LE from EOB; increased time; HOB flat   Transfers Overall transfer level: Needs assistance Equipment used: Rolling walker (2 wheeled) Transfers: Sit to/from Stand Sit to Stand: Min assist         General transfer comment: assit upon standing for balance; cues for hand placement and safety awareness  Ambulation/Gait Ambulation/Gait assistance: Min assist Ambulation Distance (Feet): 30 Feet (15,15) Assistive device: Rolling walker (2 wheeled) Gait Pattern/deviations: Step-to pattern;Decreased stance time - right;Decreased step length - left;Decreased weight shift to  right;Antalgic;Trunk flexed Gait velocity: decreased.   General Gait Details: cues for position of RW, posture, and sequencing; pt with tendency to lean on RW due to increased pain; assist for stability   Stairs            Wheelchair Mobility    Modified Rankin (Stroke Patients Only)       Balance Overall balance assessment: Needs assistance Sitting-balance support: No upper extremity supported;Feet supported Sitting balance-Leahy Scale: Good     Standing balance support: Bilateral upper extremity supported Standing balance-Leahy Scale: Poor                      Cognition Arousal/Alertness: Lethargic;Suspect due to medications Behavior During Therapy: [facility] tasks assessed/performed Overall Cognitive Status: Within Functional Limits for tasks assessed                      Exercises Total Joint Exercises Quad Sets: Strengthening;Right;10 reps;Seated    General Comments        Pertinent Vitals/Pain Pain Assessment: 0-10 Pain Score: 10-Worst pain ever Pain Location: R knee Pain Descriptors / Indicators: Aching;Grimacing;Guarding;Sore Pain Intervention(s): Limited activity within patient's tolerance;Monitored during session;Patient requesting pain meds-RN notified;Ice applied    Home Living                      Prior Function            PT Goals (current goals can now be found in the care plan section) Acute Rehab PT Goals Patient Stated Goal: less pain PT Goal Formulation: With patient Time For Goal Achievement:  02/03/16 Potential to Achieve Goals: Good Progress towards PT goals: Progressing toward goals    Frequency  7X/week    PT Plan Discharge plan needs to be updated    Co-evaluation             End of Session Equipment Utilized During Treatment: Gait belt Activity Tolerance: Patient limited by pain Patient left: in chair;with call bell/phone within reach (in knee extension)     Time: 4720-7218 PT Time  Calculation (min) (ACUTE ONLY): 36 min  Charges:  $Gait Training: 8-22 mins $Therapeutic Activity: 8-22 mins                    G Codes:      Salina April, PTA Pager: [phone]   01/21/2016, 10:22 AM

## 2016-01-21 NOTE — Evaluation (Signed)
Occupational Therapy Evaluation Patient Details Name: Barbara French MRN: [id number] DOB: [birth year] Today's Date: 01/21/2016    History of Present Illness Pt is a 60 y.o. female now s/p Rt TKA. PMH: diabetes, depression, hypertension, CAD, TIA, PE, neuropathy, MI, COPD.    Clinical Impression   Pt with decline in function and safety with ADLs and ADL mobility with decreased strength, balance and endurance. Pt would benefit from acute OT services to address impairments to increase level of function and safety. Concerned about amount of assist and supervision pt will have at home. Pt plans to have her 51 and 61 year old grandchildren "assist" her while her son, daughter and daughter in law are working during the day. Pt stated to OT that she is concerned about not having an adult there with her and that she is hoping her boyfriend will hire someone for assist during the day, however she is not interested in short term SNF rehab and only [facility] then outpatient therapy. Pt also states that she is concerned about how she would get to outpatient therapy appointments. Pt's nurse and case manger have been notified of therapy's concerns     Follow Up Recommendations  Home health OT;Supervision/Assistance - 24 hour (may need SNF due to not having assist from an adult or trained caregiver at home)    Equipment Recommendations  Tub/shower bench;3 in 1 bedside comode    Recommendations for Other Services       Precautions / Restrictions Precautions Precautions: Knee;Fall Precaution Booklet Issued: Yes (comment) Precaution Comments: HEP provided, reviewed knee extension precautions Restrictions Weight Bearing Restrictions: Yes RLE Weight Bearing: Weight bearing as tolerated      Mobility Bed Mobility Overal bed mobility: Needs Assistance Bed Mobility: Supine to Sit     Supine to sit: Supervision;Min guard     General bed mobility comments: pt in recliner upon entering  room  Transfers Overall transfer level: Needs assistance Equipment used: Rolling walker (2 wheeled) Transfers: Sit to/from Stand Sit to Stand: Min assist         General transfer comment: assit upon standing for balance; cues for hand placement and safety awareness    Balance Overall balance assessment: Needs assistance Sitting-balance support: No upper extremity supported;Feet supported Sitting balance-Leahy Scale: Good     Standing balance support: Bilateral upper extremity supported;Single extremity supported;During functional activity Standing balance-Leahy Scale: Poor                              ADL Overall ADL's : Needs assistance/impaired     Grooming: Wash/dry hands;Wash/dry face;Standing;Min guard   Upper Body Bathing: Supervision/ safety;Set up;Sitting   Lower Body Bathing: Maximal assistance   Upper Body Dressing : Supervision/safety;Set up;Sitting   Lower Body Dressing: Maximal assistance   Toilet Transfer: Minimal assistance;Regular Toilet;Grab bars;RW;Ambulation   Toileting- Clothing Manipulation and Hygiene: Moderate assistance;Sit to/from Nurse, children's Details (indicate cue type and reason): pt has tub shower at home, no tub bench Functional mobility during ADLs: Minimal assistance;Cueing for safety       Vision  wears reading glasses, no change from baseline              Pertinent Vitals/Pain Pain Assessment: 0-10 Pain Score: 10-Worst pain ever Pain Location: Pt reports 10/10 R knee pain, however pt has been premedicated 45 minutes - 1 hour prior with all pain meds that nurisng allowed to give at this time  and did not display any increased difficulty during session Pain Descriptors / Indicators: Sore Pain Intervention(s): Monitored during session;Premedicated before session;Repositioned     Hand Dominance Right   Extremity/Trunk Assessment Upper Extremity Assessment Upper Extremity Assessment: Overall WFL  for tasks assessed   Lower Extremity Assessment Lower Extremity Assessment: Defer to PT evaluation   Cervical / Trunk Assessment Cervical / Trunk Assessment: Normal   Communication Communication Communication: No difficulties   Cognition Arousal/Alertness: Awake/alert;Lethargic;Suspect due to medications Behavior During Therapy: [facility] for tasks assessed/performed Overall Cognitive Status: Within Functional Limits for tasks assessed                     General Comments   pt pleasant and cooperative                Home Living Family/patient expects to be discharged to:: Private residence Living Arrangements: Spouse/significant other Available Help at Discharge: Family;Available PRN/intermittently Type of Home: House Home Access: Stairs to enter Entrance Stairs-Number of Steps: 3-4   Home Layout: Multi-level;Able to live on main level with bedroom/bathroom     Bathroom Shower/Tub: Teacher, early years/pre: Standard     Home Equipment: Cane - single point          Prior Functioning/Environment Level of Independence: Independent             OT Diagnosis: Generalized weakness;Acute pain   OT Problem List: Decreased strength;Decreased knowledge of use of DME or AE;Decreased activity tolerance;Pain;Impaired balance (sitting and/or standing);Decreased safety awareness   OT Treatment/Interventions: Self-care/ADL training;Patient/family education;Therapeutic activities;DME and/or AE instruction    OT Goals(Current goals can be found in the care plan section) Acute Rehab OT Goals Patient Stated Goal: get better and go home OT Goal Formulation: With patient Time For Goal Achievement: 01/28/16 Potential to Achieve Goals: Good ADL Goals Pt Will Perform Grooming: with set-up;with supervision;standing Pt Will Perform Upper Body Bathing: with set-up;sitting Pt Will Perform Lower Body Bathing: with mod assist;with min assist;sitting/lateral leans;sit  to/from stand Pt Will Perform Upper Body Dressing: with set-up;sitting Pt Will Perform Lower Body Dressing: with mod assist;with min assist;sit to/from stand;sitting/lateral leans;with adaptive equipment Pt Will Transfer to Toilet: with min guard assist;with supervision;ambulating;regular height toilet;grab bars Pt Will Perform Toileting - Clothing Manipulation and hygiene: with min guard assist;sit to/from stand Pt Will Perform Tub/Shower Transfer: tub bench;grab bars;with min guard assist;with supervision  OT Frequency: Min 2X/week   Barriers to D/C: Decreased caregiver support  pt planing to have 78 and 11 year old grandchildren as primary caregivers duirng the day while her adult children are working                     End of Glass blower/designer During Treatment: Gait belt;Rolling walker CPM Right Knee CPM Right Knee: Off  Activity Tolerance: Patient tolerated treatment well Patient left: in chair;with call bell/phone within reach   Time: 1126-1155 OT Time Calculation (min): 29 min Charges:  OT General Charges $OT Visit: 1 Procedure OT Evaluation $OT Eval Moderate Complexity: 1 Procedure OT Treatments $Self Care/Home Management : 8-22 mins $Therapeutic Activity: 8-22 mins G-Codes:    Britt Bottom 01/21/2016, 12:33 PM

## 2016-01-22 ENCOUNTER — Encounter (HOSPITAL_COMMUNITY): Payer: Self-pay | Admitting: General Practice

## 2016-01-22 LAB — CBC
HCT: 34.7 % — ABNORMAL LOW (ref 36.0–46.0)
HEMOGLOBIN: 11.2 g/dL — AB (ref 12.0–15.0)
MCH: 30.1 pg (ref 26.0–34.0)
MCHC: 32.3 g/dL (ref 30.0–36.0)
MCV: 93.3 fL (ref 78.0–100.0)
Platelets: 200 10*3/uL (ref 150–400)
RBC: 3.72 MIL/uL — AB (ref 3.87–5.11)
RDW: 13.7 % (ref 11.5–15.5)
WBC: 11.9 10*3/uL — ABNORMAL HIGH (ref 4.0–10.5)

## 2016-01-22 LAB — GLUCOSE, CAPILLARY: GLUCOSE-CAPILLARY: 123 mg/dL — AB (ref 65–99)

## 2016-01-22 NOTE — Progress Notes (Signed)
Orthopedic Tech Progress Note Patient Details:  VIDHI DELELLIS 27-Jan-1956 [id number]  Patient ID: [name], female   DOB: [birth year], 60 y.o.   MRN: [id number] Applied cpm 0-35. This is all the pt could handle.  [name] 01/22/2016, 5:39 AM

## 2016-01-22 NOTE — Progress Notes (Signed)
Physical Therapy Treatment Patient Details Name: Barbara French MRN: [id number] DOB: 1955-12-05 Today's Date: 01/22/2016    History of Present Illness Pt is a 60 y.o. female now s/p Rt TKA. PMH: diabetes, depression, hypertension, CAD, TIA, PE, neuropathy, MI, COPD.     PT Comments    Patient is making gradual progress toward mobility goals although continues to be lethargic and need verbal and tactile stimulation to stay awake and attend to tasks. Pt is addiment about d/cing home. Patient needs to practice stairs next session.     Follow Up Recommendations  Home health PT;Supervision for mobility/OOB     Equipment Recommendations  Rolling walker with 5" wheels;3in1 (PT)    Recommendations for Other Services       Precautions / Restrictions Precautions Precautions: Knee;Fall Precaution Booklet Issued: Yes (comment) Precaution Comments: HEP provided, reviewed knee extension precautions Restrictions Weight Bearing Restrictions: Yes RLE Weight Bearing: Weight bearing as tolerated    Mobility  Bed Mobility Overal bed mobility: Needs Assistance Bed Mobility: Supine to Sit     Supine to sit: Supervision;Min guard     General bed mobility comments: OOB in chair upon arrival  Transfers Overall transfer level: Needs assistance Equipment used: Rolling walker (2 wheeled) Transfers: Sit to/from Stand Sit to Stand: Min guard         General transfer comment: cues for safe hand placement and technique  Ambulation/Gait Ambulation/Gait assistance: Min guard Ambulation Distance (Feet): 100 Feet Assistive device: Rolling walker (2 wheeled) Gait Pattern/deviations: Step-to pattern;Decreased step length - left;Decreased stance time - right;Antalgic;Trunk flexed Gait velocity: decreased.   General Gait Details: cues for safe use of AD, posture, and increased WB on R LE; pt c/o pain throughout and needed standing rest break; during brief standing rest break pt began to fall  asleep and began to take one hand off of RW and needed tactile and verbal cues to wak up and continue ambulating   Stairs            Wheelchair Mobility    Modified Rankin (Stroke Patients Only)       Balance Overall balance assessment: Needs assistance Sitting-balance support: No upper extremity supported;Feet supported Sitting balance-Leahy Scale: Good     Standing balance support: Bilateral upper extremity supported;During functional activity Standing balance-Leahy Scale: Poor                      Cognition Arousal/Alertness: Awake/alert Behavior During Therapy: WFL for tasks assessed/performed Overall Cognitive Status: Within Functional Limits for tasks assessed                      Exercises Total Joint Exercises Quad Sets: Strengthening;Right;5 reps;Seated (limited due to pt falling asleep) Heel Slides: Right;10 reps;Seated;AAROM Knee Flexion: AROM;Right;10 reps;Seated Goniometric ROM: 0-85    General Comments General comments (skin integrity, edema, etc.): pt very lethargic throughout session and needed verbal and tactile stimulation to stay awake and attend to tasks including ambulating      Pertinent Vitals/Pain Pain Assessment: No/denies pain Faces Pain Scale: Hurts little more Pain Location: R knee Pain Descriptors / Indicators: Sore Pain Intervention(s): Monitored during session;Premedicated before session;Repositioned;Ice applied    Home Living                      Prior Function            PT Goals (current goals can now be found in the care plan section) Acute  Rehab PT Goals Patient Stated Goal: get better PT Goal Formulation: With patient Time For Goal Achievement: 02/03/16 Potential to Achieve Goals: Good    Frequency  7X/week    PT Plan Discharge plan needs to be updated    Co-evaluation             End of Session Equipment Utilized During Treatment: Gait belt Activity Tolerance: Patient limited  by lethargy Patient left: in chair;with call bell/phone within reach (in knee extension)     Time: 7116-5790 PT Time Calculation (min) (ACUTE ONLY): 36 min  Charges:  $Gait Training: 8-22 mins $Therapeutic Exercise: 8-22 mins                    G Codes:      Salina April, PTA Pager: [phone]   01/22/2016, 1:08 PM

## 2016-01-22 NOTE — Progress Notes (Signed)
Occupational Therapy Treatment Patient Details Name: Barbara French MRN: [id number] DOB: December 06, 1955 Today's Date: 01/22/2016    History of present illness Pt is a 60 y.o. female now s/p Rt TKA. PMH: diabetes, depression, hypertension, CAD, TIA, PE, neuropathy, MI, COPD.    OT comments  Pt making progress with functional goals. Pt participated in morning ADLs at sink and seated in recliner. Pt did not c/o pain during session. OT to continue to follow acutely  Follow Up Recommendations  Home health OT;Supervision/Assistance - 24 hour    Equipment Recommendations  Tub/shower bench;3 in 1 bedside comode    Recommendations for Other Services      Precautions / Restrictions Precautions Precautions: Knee;Fall Precaution Booklet Issued: Yes (comment) Precaution Comments: HEP provided, reviewed knee extension precautions Restrictions Weight Bearing Restrictions: Yes RLE Weight Bearing: Weight bearing as tolerated       Mobility Bed Mobility Overal bed mobility: Needs Assistance Bed Mobility: Supine to Sit     Supine to sit: Supervision;Min guard     General bed mobility comments: OOB in chair upon arrival  Transfers Overall transfer level: Needs assistance Equipment used: Rolling walker (2 wheeled) Transfers: Sit to/from Stand Sit to Stand: Min guard         General transfer comment: cues for safe hand placement and technique    Balance Overall balance assessment: Needs assistance Sitting-balance support: No upper extremity supported;Feet supported Sitting balance-Leahy Scale: Good     Standing balance support: Bilateral upper extremity supported;During functional activity Standing balance-Leahy Scale: Poor                     ADL       Grooming: Wash/dry hands;Standing;Supervision/safety;Set up;Oral care   Upper Body Bathing: Set up;Sitting   Lower Body Bathing: Set up;Supervison/ safety;Sitting/lateral leans   Upper Body Dressing : Set  up;Sitting       Toilet Transfer: Regular Toilet;Grab bars;RW;Ambulation;Min guard;Cueing for safety   Toileting- Clothing Manipulation and Hygiene: Sit to/from stand;Min guard     Tub/Shower Transfer Details (indicate cue type and reason): pt has tub shower at home, no tub bench. Per pt, will not be purchasing tub bench at this time Functional mobility during ADLs: Cueing for safety;Min guard        Vision  no change from baseline                              Cognition   Behavior During Therapy: [facility] for tasks assessed/performed Overall Cognitive Status: Within Functional Limits for tasks assessed                       Extremity/Trunk Assessment   WFL                       General Comments  Pt very pleasant and cooperative    Pertinent Vitals/ Pain       Pain Assessment: No/denies pain Faces Pain Scale: Hurts little more Pain Location: R knee Pain Descriptors / Indicators: Sore Pain Intervention(s): Monitored during session;Premedicated before session;Repositioned;Ice applied  Home Living  home with boyfriend                                        Prior Functioning/Environment  independent  Frequency Min 2X/week     Progress Toward Goals  OT Goals(current goals can now be found in the care plan section)  Progress towards OT goals: Progressing toward goals  Acute Rehab OT Goals Patient Stated Goal: get better  Plan Discharge plan remains appropriate                     End of Session Equipment Utilized During Treatment: Rolling walker CPM Right Knee CPM Right Knee: Off   Activity Tolerance Patient tolerated treatment well   Patient Left in chair;with call bell/phone within reach             Time: 2010-0712 OT Time Calculation (min): 30 min  Charges: OT General Charges $OT Visit: 1 Procedure OT Treatments $Self Care/Home Management : 8-22 mins $Therapeutic Activity: 8-22  mins  Britt Bottom 01/22/2016, 11:29 AM

## 2016-01-22 NOTE — Progress Notes (Signed)
RN and SW spoke about patient being too drowsy to participate in her care or finish conversations. The PA has been notified of this and RN was told by verbal orders to d/c IV pain medication.

## 2016-01-22 NOTE — Progress Notes (Signed)
SPORTS MEDICINE AND JOINT REPLACEMENT  Lara Mulch, MD   [facility] PA-C [place], [place], [place]  [address]                             [phone]   PROGRESS NOTE  Subjective:  negative for Chest Pain  negative for Shortness of Breath  negative for Nausea/Vomiting   negative for Calf Pain  negative for Bowel Movement   Tolerating Diet: yes         Patient reports pain as 5 on 0-10 scale.    Objective: Vital signs in last 24 hours:   Patient Vitals for the past 24 hrs:  BP Temp Temp src Pulse Resp SpO2  01/22/16 0559 138/60 mmHg 98.2 F (36.8 C) Oral 87 18 96 %  01/21/16 2125 107/64 mmHg 98.4 F (36.9 C) Oral 91 18 91 %  01/21/16 1500 121/60 mmHg 98.3 F (36.8 C) - 88 18 94 %    '@flow'$ {1959:LAST@   Intake/Output from previous day:   06/13 0701 - 06/14 0700 In: 720 [P.O.:720] Out: -    Intake/Output this shift:       Intake/Output      06/13 0701 - 06/14 0700 06/14 0701 - 06/15 0700   P.O. 720    I.V. (mL/kg)     Total Intake(mL/kg) 720 (9.3)    Urine (mL/kg/hr)     Blood     Total Output       Net +720          Urine Occurrence 3 x       LABORATORY DATA:  Recent Labs  01/21/16 0524 01/22/16 0435  WBC 11.4* 11.9*  HGB 12.0 11.2*  HCT 38.1 34.7*  PLT 216 200    Recent Labs  01/21/16 0524  NA 130*  K 4.4  CL 99*  CO2 25  BUN 6  CREATININE 0.64  GLUCOSE 96  CALCIUM 8.3*   Lab Results  Component Value Date   INR 0.95 01/09/2016   INR 1.0 09/05/2008   INR 0.9 RATIO 02/28/2008    Examination:  General appearance: alert, cooperative and no distress Extremities: extremities normal, atraumatic, no cyanosis or edema  Wound Exam: clean, dry, intact   Drainage:  None: wound tissue dry  Motor Exam: Quadriceps and Hamstrings Intact  Sensory Exam: Superficial Peroneal, Deep Peroneal and Tibial normal   Assessment:    2 Days Post-Op  Procedure(s) (LRB): RIGHT TOTAL KNEE ARTHROPLASTY (Right)  ADDITIONAL DIAGNOSIS:   Active Problems:   S/P total knee replacement  Acute Blood Loss Anemia   Plan: Physical Therapy as ordered Weight Bearing as Tolerated (WBAT)  DVT Prophylaxis:  Lovenox  DISCHARGE PLAN: Home  DISCHARGE NEEDS: HHPT   Patient is doing better, expected D/C to home tomorrow         Donia Ast 01/22/2016, 8:14 AM

## 2016-01-22 NOTE — Progress Notes (Signed)
Physical Therapy Treatment Patient Details Name: Barbara French MRN: [id number] DOB: Jan 03, 1956 Today's Date: 01/22/2016    History of Present Illness Pt is a 60 y.o. female now s/p Rt TKA. PMH: diabetes, depression, hypertension, CAD, TIA, PE, neuropathy, MI, COPD.     PT Comments    Pt reporting 9/10 R knee pain at rest and with activity. She performed R TKA exercises with assistance and ambulated a short distance, pain limiting activity tolerance. Will plan stair training for tomorrow morning. Secretary notified that pt's call bell isn't working.    Follow Up Recommendations  Home health PT;Supervision for mobility/OOB     Equipment Recommendations  Rolling walker with 5" wheels;3in1 (PT)    Recommendations for Other Services       Precautions / Restrictions Precautions Precautions: Knee;Fall Precaution Booklet Issued: Yes (comment) Precaution Comments: HEP provided, r Restrictions Weight Bearing Restrictions: Yes RLE Weight Bearing: Weight bearing as tolerated    Mobility  Bed Mobility Overal bed mobility: Needs Assistance Bed Mobility: Supine to Sit     Supine to sit: Supervision;Min guard     General bed mobility comments: up in recliner  Transfers Overall transfer level: Needs assistance Equipment used: Rolling walker (2 wheeled) Transfers: Sit to/from Stand Sit to Stand: Min guard         General transfer comment: cues for safe hand placement and technique  Ambulation/Gait Ambulation/Gait assistance: Min guard Ambulation Distance (Feet): 45 Feet Assistive device: Rolling walker (2 wheeled) Gait Pattern/deviations: Step-to pattern;Decreased step length - right;Decreased step length - left;Decreased stance time - right Gait velocity: decreased.   General Gait Details: distance limited by pain   Stairs            Wheelchair Mobility    Modified Rankin (Stroke Patients Only)       Balance Overall balance assessment: Needs  assistance Sitting-balance support: No upper extremity supported;Feet supported Sitting balance-Leahy Scale: Good     Standing balance support: Bilateral upper extremity supported;During functional activity Standing balance-Leahy Scale: Poor                      Cognition Arousal/Alertness: Awake/alert;Lethargic (fell asleep during ther ex, stated she has narcolepsy) Behavior During Therapy: WFL for tasks assessed/performed Overall Cognitive Status: Within Functional Limits for tasks assessed                      Exercises Total Joint Exercises Quad Sets: Strengthening;5 reps;10 reps;Supine;Both (limited due to pt falling asleep) Short Arc Quad: AAROM;Right;10 reps;Supine Heel Slides: Right;10 reps;AAROM;Supine Hip ABduction/ADduction: AAROM;Right;10 reps;Supine Straight Leg Raises: AAROM;Right;10 reps;Supine Knee Flexion: AROM;Right;10 reps;Seated Goniometric ROM: 0-85    General Comments General comments (skin integrity, edema, etc.): pt very lethargic throughout session and needed verbal and tactile stimulation to stay awake and attend to tasks including ambulating      Pertinent Vitals/Pain Pain Assessment: No/denies pain Pain Score: 9  Faces Pain Scale: Hurts little more Pain Location: r knee Pain Descriptors / Indicators: Burning Pain Intervention(s): Limited activity within patient's tolerance;Monitored during session;Premedicated before session    Home Living                      Prior Function            PT Goals (current goals can now be found in the care plan section) Acute Rehab PT Goals Patient Stated Goal: get better PT Goal Formulation: With patient Time For Goal Achievement: 02/03/16  Potential to Achieve Goals: Good    Frequency  7X/week    PT Plan Current plan remains appropriate    Co-evaluation             End of Session Equipment Utilized During Treatment: Gait belt Activity Tolerance: Patient limited by  pain Patient left: with call bell/phone within reach (on commode, nurse tech aware)     Time: 9233-0076 PT Time Calculation (min) (ACUTE ONLY): 36 min  Charges:  $Gait Training: 8-22 mins $Therapeutic Exercise: 8-22 mins                    G Codes:      Philomena Doheny 01/22/2016, 2:44 PM [phone]

## 2016-01-22 NOTE — Progress Notes (Signed)
Orthopedic Tech Progress Note Patient Details:  Barbara French [birth year] [id number] Ortho visit put on cpm at [place] Patient ID: Barbara French, female   DOB: 1956/04/10, 60 y.o.   MRN: [id number]   Barbara French 01/22/2016, 6:07 PM

## 2016-01-23 LAB — CBC
HEMATOCRIT: 33.1 % — AB (ref 36.0–46.0)
Hemoglobin: 10.7 g/dL — ABNORMAL LOW (ref 12.0–15.0)
MCH: 29.8 pg (ref 26.0–34.0)
MCHC: 32.3 g/dL (ref 30.0–36.0)
MCV: 92.2 fL (ref 78.0–100.0)
PLATELETS: 210 10*3/uL (ref 150–400)
RBC: 3.59 MIL/uL — AB (ref 3.87–5.11)
RDW: 13.7 % (ref 11.5–15.5)
WBC: 8.4 10*3/uL (ref 4.0–10.5)

## 2016-01-23 MED ORDER — ONDANSETRON HCL 4 MG PO TABS: 4.0000 mg | ORAL_TABLET | Freq: Four times a day (QID) | ORAL | Status: DC | PRN

## 2016-01-23 MED ORDER — OXYCODONE HCL 5 MG PO TABS: 5.0000 mg | ORAL_TABLET | ORAL | Status: DC | PRN

## 2016-01-23 MED ORDER — CYCLOBENZAPRINE HCL 10 MG PO TABS: 10.0000 mg | ORAL_TABLET | Freq: Three times a day (TID) | ORAL | Status: DC | PRN

## 2016-01-23 NOTE — Progress Notes (Signed)
SPORTS MEDICINE AND JOINT REPLACEMENT  Lara Mulch, MD   [facility] PA-C [place], [place], [place]  [address]                             [phone]   PROGRESS NOTE  Subjective:  negative for Chest Pain  negative for Shortness of Breath  negative for Nausea/Vomiting   negative for Calf Pain  negative for Bowel Movement   Tolerating Diet: yes         Patient reports pain as 5 on 0-10 scale.    Objective: Vital signs in last 24 hours:   Patient Vitals for the past 24 hrs:  BP Temp Temp src Pulse Resp SpO2  01/23/16 0500 132/71 mmHg 97.9 F (36.6 C) Oral 89 17 96 %  01/22/16 1949 126/67 mmHg 98 F (36.7 C) Oral 91 17 96 %  01/22/16 1500 (!) 115/53 mmHg 98 F (36.7 C) - 89 16 94 %  01/22/16 0849 125/61 mmHg - - 79 - -    '@flow'$ {1959:LAST@   Intake/Output from previous day:       Intake/Output this shift:       Intake/Output      06/14 0701 - 06/15 0700 06/15 0701 - 06/16 0700   P.O.     Total Intake(mL/kg)     Net            Urine Occurrence 4 x       LABORATORY DATA:  Recent Labs  01/21/16 0524 01/22/16 0435 01/23/16 0637  WBC 11.4* 11.9* 8.4  HGB 12.0 11.2* 10.7*  HCT 38.1 34.7* 33.1*  PLT 216 200 210    Recent Labs  01/21/16 0524  NA 130*  K 4.4  CL 99*  CO2 25  BUN 6  CREATININE 0.64  GLUCOSE 96  CALCIUM 8.3*   Lab Results  Component Value Date   INR 0.95 01/09/2016   INR 1.0 09/05/2008   INR 0.9 RATIO 02/28/2008    Examination:  General appearance: alert, cooperative and no distress Extremities: extremities normal, atraumatic, no cyanosis or edema  Wound Exam: clean, dry, intact   Drainage:  None: wound tissue dry  Motor Exam: Quadriceps and Hamstrings Intact  Sensory Exam: Superficial Peroneal, Deep Peroneal and Tibial normal   Assessment:    3 Days Post-Op  Procedure(s) (LRB): RIGHT TOTAL KNEE ARTHROPLASTY (Right)  ADDITIONAL DIAGNOSIS:  Active Problems:   S/P total knee replacement  Acute  Blood Loss Anemia   Plan: Physical Therapy as ordered Weight Bearing as Tolerated (WBAT)  DVT Prophylaxis:  Plavix/ASA  DISCHARGE PLAN: Home  DISCHARGE NEEDS: HHPT   Patient doing better, Will D/C today         Donia Ast 01/23/2016, 7:58 AM
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
## 2016-01-23 NOTE — Progress Notes (Signed)
Physical Therapy Treatment Patient Details Name: Barbara French MRN: [id number] DOB: 06-12-1956 Today's Date: 01/23/2016    History of Present Illness Pt is a 60 y.o. female now s/p Rt TKA. PMH: diabetes, depression, hypertension, CAD, TIA, PE, neuropathy, MI, COPD.     PT Comments    Pt able to perform stairs without rails and family assist by the end of PT session. Pt and family deny any questions or concerns and both agree that they feel confident with performing stairs at home. Reinforced need for HEP and continuation of PT to continued gains. Following session the pt denied any questions or concerns.   Follow Up Recommendations  Home health PT;Supervision for mobility/OOB     Equipment Recommendations  Rolling walker with 5" wheels;3in1 (PT)    Recommendations for Other Services       Precautions / Restrictions Precautions Precautions: Knee;Fall Restrictions Weight Bearing Restrictions: Yes RLE Weight Bearing: Weight bearing as tolerated    Mobility  Bed Mobility               General bed mobility comments: up in chair upon arrival  Transfers Overall transfer level: Needs assistance Equipment used: Rolling walker (2 wheeled) Transfers: Sit to/from Stand Sit to Stand: Min guard         General transfer comment: consistent with hand placement  Ambulation/Gait             General Gait Details: Pt declined ambulation.   Stairs Stairs: Yes Stairs assistance: Min assist Stair Management: Backwards;With walker Number of Stairs: 2 General stair comments: Stairs performed with daughter Barbara French assisting to stabilize rw. Pt and family declining second attempt, both reporting that they feel confident with doing stairs at home.   Wheelchair Mobility    Modified Rankin (Stroke Patients Only)       Balance Overall balance assessment: Needs assistance Sitting-balance support: No upper extremity supported Sitting balance-Leahy Scale: Good      Standing balance support: Bilateral upper extremity supported Standing balance-Leahy Scale: Poor Standing balance comment: using rw for support                    Cognition Arousal/Alertness: Awake/alert Behavior During Therapy: WFL for tasks assessed/performed Overall Cognitive Status: Within Functional Limits for tasks assessed                      Exercises      General Comments General comments (skin integrity, edema, etc.): Reviewed need for consistency with HEP, pt confirms having copy and denies questions about exercises. Reinforced frequency of 3Xday.       Pertinent Vitals/Pain Pain Assessment: 0-10 Pain Score: 7  Pain Location: Rt knee Pain Descriptors / Indicators: Aching Pain Intervention(s): Monitored during session;Limited activity within patient's tolerance    Home Living                      Prior Function            PT Goals (current goals can now be found in the care plan section) Acute Rehab PT Goals Patient Stated Goal: get home PT Goal Formulation: With patient Time For Goal Achievement: 02/03/16 Potential to Achieve Goals: Good Progress towards PT goals: Progressing toward goals    Frequency  7X/week    PT Plan Current plan remains appropriate    Co-evaluation             End of Session Equipment Utilized During Treatment: Gait  belt Activity Tolerance: Patient tolerated treatment well Patient left: in chair;with call bell/phone within reach;with family/visitor present     Time: 8864-8472 PT Time Calculation (min) (ACUTE ONLY): 21 min  Charges:  $Gait Training: 8-22 mins                    G Codes:      Cassell Clement, PT, CSCS Pager [phone] Office [phone]  01/23/2016, 3:28 PM

## 2016-01-23 NOTE — Progress Notes (Signed)
Orthopedic Tech Progress Note Patient Details:  Barbara French 20-Jul-1956 [id number]  Patient ID: [name], female   DOB: 1956-02-01, 60 y.o.   MRN: [id number] Applied cpm 0-35. Pt didn't want to go any higher.  [name] 01/23/2016, 6:29 AM

## 2016-01-23 NOTE — Care Management Important Message (Signed)
Important Message  Patient Details  Name: Barbara French MRN: [id number] Date of Birth: 08-Nov-1955   Medicare Important Message Given:  Yes    [name] 01/23/2016, 9:57 AM

## 2016-01-23 NOTE — Progress Notes (Signed)
Pt ready for discharge. Education/instructions reviewed with pt and daughter and all questions/concerns addressed. IV removed and belongings gathered. Pt will be transported out via wheelchair to daughther's car. Will continue to monitor

## 2016-01-23 NOTE — Progress Notes (Signed)
Physical Therapy Treatment Patient Details Name: Barbara French MRN: [id number] DOB: 1956/03/12 Today's Date: 01/23/2016    History of Present Illness Pt is a 60 y.o. female now s/p Rt TKA. PMH: diabetes, depression, hypertension, CAD, TIA, PE, neuropathy, MI, COPD.     PT Comments    Pt reports that she is going to be going home today. Pt confirming that she feels safe with anticipated level of supervision at home. Attempted stairs with rails, pt now reporting that she does not have any railing to enter her home. Will attempt stairs at second PT session, to review with family if available.   Follow Up Recommendations  Home health PT;Supervision for mobility/OOB     Equipment Recommendations  Rolling walker with 5" wheels;3in1 (PT)    Recommendations for Other Services       Precautions / Restrictions Precautions Precautions: Knee;Fall Restrictions Weight Bearing Restrictions: Yes RLE Weight Bearing: Weight bearing as tolerated    Mobility  Bed Mobility Overal bed mobility: Needs Assistance Bed Mobility: Supine to Sit     Supine to sit: Min guard     General bed mobility comments: min guard for safety  Transfers Overall transfer level: Needs assistance Equipment used: Rolling walker (2 wheeled) Transfers: Sit to/from Stand Sit to Stand: Min guard         General transfer comment: cues needed for hand placement.   Ambulation/Gait Ambulation/Gait assistance: Min guard Ambulation Distance (Feet): 60 Feet Assistive device: Rolling walker (2 wheeled) Gait Pattern/deviations: Step-to pattern;Decreased weight shift to right Gait velocity: decreased   General Gait Details: encouraging step-through pattern   Stairs Stairs: Yes Stairs assistance: Min assist Stair Management: Two rails;Step to pattern;Forwards Number of Stairs: 2 General stair comments: Pt now reporting that she does not have rails on her stairs. Discussed attempting again when family present.    Wheelchair Mobility    Modified Rankin (Stroke Patients Only)       Balance Overall balance assessment: Needs assistance Sitting-balance support: No upper extremity supported Sitting balance-Leahy Scale: Good     Standing balance support: Bilateral upper extremity supported Standing balance-Leahy Scale: Good Standing balance comment: using rw for support                    Cognition Arousal/Alertness: Lethargic Behavior During Therapy: WFL for tasks assessed/performed Overall Cognitive Status: Within Functional Limits for tasks assessed                      Exercises Total Joint Exercises Ankle Circles/Pumps: AROM;Both;15 reps Quad Sets: Strengthening;10 reps;Right Heel Slides: Right;10 reps;AAROM;Supine Hip ABduction/ADduction: AAROM;Right;10 reps;Supine Straight Leg Raises: Strengthening;Right;10 reps (mod/max assist) Goniometric ROM: approx. 70 degress flexion with endrange guarding    General Comments General comments (skin integrity, edema, etc.): pt remains lethargic, cues to attend to task provided throughout session.       Pertinent Vitals/Pain Pain Assessment: 0-10 Pain Score: 8  Pain Location: Rt knee  Pain Descriptors / Indicators: Aching;Sharp Pain Intervention(s): Limited activity within patient's tolerance;Monitored during session;Ice applied    Home Living                      Prior Function            PT Goals (current goals can now be found in the care plan section) Acute Rehab PT Goals Patient Stated Goal: go home.  PT Goal Formulation: With patient Time For Goal Achievement: 02/03/16 Potential to Achieve Goals:  Good Progress towards PT goals: Progressing toward goals    Frequency  7X/week    PT Plan Current plan remains appropriate    Co-evaluation             End of Session Equipment Utilized During Treatment: Gait belt Activity Tolerance: Patient limited by lethargy Patient left: in chair;with  call bell/phone within reach (in knee extension)     Time: 4734-0370 PT Time Calculation (min) (ACUTE ONLY): 35 min  Charges:  $Gait Training: 8-22 mins $Therapeutic Exercise: 8-22 mins                    G Codes:      Cassell Clement, PT, CSCS Pager [phone] Office [phone]  01/23/2016, 10:41 AM

## 2016-01-24 DIAGNOSIS — D649 Anemia, unspecified: Secondary | ICD-10-CM | POA: Diagnosis not present

## 2016-01-24 DIAGNOSIS — E785 Hyperlipidemia, unspecified: Secondary | ICD-10-CM | POA: Diagnosis not present

## 2016-01-24 DIAGNOSIS — I2510 Atherosclerotic heart disease of native coronary artery without angina pectoris: Secondary | ICD-10-CM | POA: Diagnosis not present

## 2016-01-24 DIAGNOSIS — I10 Essential (primary) hypertension: Secondary | ICD-10-CM | POA: Diagnosis not present

## 2016-01-24 DIAGNOSIS — Z471 Aftercare following joint replacement surgery: Secondary | ICD-10-CM | POA: Diagnosis not present

## 2016-01-24 DIAGNOSIS — J449 Chronic obstructive pulmonary disease, unspecified: Secondary | ICD-10-CM | POA: Diagnosis not present

## 2016-01-24 DIAGNOSIS — G4730 Sleep apnea, unspecified: Secondary | ICD-10-CM | POA: Diagnosis not present

## 2016-01-24 DIAGNOSIS — E559 Vitamin D deficiency, unspecified: Secondary | ICD-10-CM | POA: Diagnosis not present

## 2016-01-24 DIAGNOSIS — E1140 Type 2 diabetes mellitus with diabetic neuropathy, unspecified: Secondary | ICD-10-CM | POA: Diagnosis not present

## 2016-01-24 DIAGNOSIS — M1991 Primary osteoarthritis, unspecified site: Secondary | ICD-10-CM | POA: Diagnosis not present

## 2016-01-24 DIAGNOSIS — K219 Gastro-esophageal reflux disease without esophagitis: Secondary | ICD-10-CM | POA: Diagnosis not present

## 2016-01-25 DIAGNOSIS — E1140 Type 2 diabetes mellitus with diabetic neuropathy, unspecified: Secondary | ICD-10-CM | POA: Diagnosis not present

## 2016-01-25 DIAGNOSIS — J449 Chronic obstructive pulmonary disease, unspecified: Secondary | ICD-10-CM | POA: Diagnosis not present

## 2016-01-25 DIAGNOSIS — E559 Vitamin D deficiency, unspecified: Secondary | ICD-10-CM | POA: Diagnosis not present

## 2016-01-25 DIAGNOSIS — Z471 Aftercare following joint replacement surgery: Secondary | ICD-10-CM | POA: Diagnosis not present

## 2016-01-25 DIAGNOSIS — E785 Hyperlipidemia, unspecified: Secondary | ICD-10-CM | POA: Diagnosis not present

## 2016-01-25 DIAGNOSIS — M1991 Primary osteoarthritis, unspecified site: Secondary | ICD-10-CM | POA: Diagnosis not present

## 2016-01-25 DIAGNOSIS — G4730 Sleep apnea, unspecified: Secondary | ICD-10-CM | POA: Diagnosis not present

## 2016-01-25 DIAGNOSIS — D649 Anemia, unspecified: Secondary | ICD-10-CM | POA: Diagnosis not present

## 2016-01-25 DIAGNOSIS — I2510 Atherosclerotic heart disease of native coronary artery without angina pectoris: Secondary | ICD-10-CM | POA: Diagnosis not present

## 2016-01-25 DIAGNOSIS — K219 Gastro-esophageal reflux disease without esophagitis: Secondary | ICD-10-CM | POA: Diagnosis not present

## 2016-01-25 DIAGNOSIS — I10 Essential (primary) hypertension: Secondary | ICD-10-CM | POA: Diagnosis not present

## 2016-01-27 DIAGNOSIS — I10 Essential (primary) hypertension: Secondary | ICD-10-CM | POA: Diagnosis not present

## 2016-01-27 DIAGNOSIS — Z7982 Long term (current) use of aspirin: Secondary | ICD-10-CM | POA: Diagnosis not present

## 2016-01-27 DIAGNOSIS — J9621 Acute and chronic respiratory failure with hypoxia: Secondary | ICD-10-CM | POA: Diagnosis not present

## 2016-01-27 DIAGNOSIS — D649 Anemia, unspecified: Secondary | ICD-10-CM

## 2016-01-27 DIAGNOSIS — J45909 Unspecified asthma, uncomplicated: Secondary | ICD-10-CM | POA: Insufficient documentation

## 2016-01-27 DIAGNOSIS — A419 Sepsis, unspecified organism: Secondary | ICD-10-CM

## 2016-01-27 DIAGNOSIS — R0602 Shortness of breath: Secondary | ICD-10-CM | POA: Diagnosis not present

## 2016-01-27 DIAGNOSIS — J9600 Acute respiratory failure, unspecified whether with hypoxia or hypercapnia: Secondary | ICD-10-CM

## 2016-01-27 DIAGNOSIS — E119 Type 2 diabetes mellitus without complications: Secondary | ICD-10-CM | POA: Diagnosis not present

## 2016-01-27 DIAGNOSIS — R600 Localized edema: Secondary | ICD-10-CM | POA: Diagnosis not present

## 2016-01-27 DIAGNOSIS — J440 Chronic obstructive pulmonary disease with acute lower respiratory infection: Secondary | ICD-10-CM | POA: Diagnosis not present

## 2016-01-27 DIAGNOSIS — E871 Hypo-osmolality and hyponatremia: Secondary | ICD-10-CM | POA: Insufficient documentation

## 2016-01-27 DIAGNOSIS — I2699 Other pulmonary embolism without acute cor pulmonale: Secondary | ICD-10-CM | POA: Diagnosis not present

## 2016-01-27 DIAGNOSIS — Z96651 Presence of right artificial knee joint: Secondary | ICD-10-CM | POA: Diagnosis not present

## 2016-01-27 DIAGNOSIS — R079 Chest pain, unspecified: Secondary | ICD-10-CM | POA: Diagnosis not present

## 2016-01-27 DIAGNOSIS — R071 Chest pain on breathing: Secondary | ICD-10-CM | POA: Diagnosis not present

## 2016-01-27 DIAGNOSIS — J168 Pneumonia due to other specified infectious organisms: Secondary | ICD-10-CM | POA: Diagnosis not present

## 2016-01-27 DIAGNOSIS — Z8673 Personal history of transient ischemic attack (TIA), and cerebral infarction without residual deficits: Secondary | ICD-10-CM | POA: Diagnosis not present

## 2016-01-27 DIAGNOSIS — Z7984 Long term (current) use of oral hypoglycemic drugs: Secondary | ICD-10-CM | POA: Diagnosis not present

## 2016-01-27 DIAGNOSIS — I2510 Atherosclerotic heart disease of native coronary artery without angina pectoris: Secondary | ICD-10-CM | POA: Diagnosis not present

## 2016-01-27 DIAGNOSIS — J9611 Chronic respiratory failure with hypoxia: Secondary | ICD-10-CM | POA: Diagnosis not present

## 2016-01-27 DIAGNOSIS — M25461 Effusion, right knee: Secondary | ICD-10-CM | POA: Diagnosis not present

## 2016-01-27 DIAGNOSIS — J180 Bronchopneumonia, unspecified organism: Secondary | ICD-10-CM | POA: Diagnosis not present

## 2016-01-27 DIAGNOSIS — I509 Heart failure, unspecified: Secondary | ICD-10-CM | POA: Diagnosis not present

## 2016-01-27 DIAGNOSIS — I110 Hypertensive heart disease with heart failure: Secondary | ICD-10-CM | POA: Diagnosis not present

## 2016-01-27 DIAGNOSIS — J9601 Acute respiratory failure with hypoxia: Secondary | ICD-10-CM | POA: Diagnosis not present

## 2016-01-27 DIAGNOSIS — J810 Acute pulmonary edema: Secondary | ICD-10-CM | POA: Diagnosis not present

## 2016-01-27 DIAGNOSIS — E118 Type 2 diabetes mellitus with unspecified complications: Secondary | ICD-10-CM | POA: Diagnosis not present

## 2016-01-27 HISTORY — DX: Sepsis, unspecified organism: A41.9

## 2016-01-27 HISTORY — DX: Anemia, unspecified: D64.9

## 2016-01-27 HISTORY — DX: Hypo-osmolality and hyponatremia: E87.1

## 2016-01-27 HISTORY — DX: Acute respiratory failure, unspecified whether with hypoxia or hypercapnia: J96.00

## 2016-02-02 DIAGNOSIS — Z471 Aftercare following joint replacement surgery: Secondary | ICD-10-CM | POA: Diagnosis not present

## 2016-02-02 DIAGNOSIS — E559 Vitamin D deficiency, unspecified: Secondary | ICD-10-CM | POA: Diagnosis not present

## 2016-02-02 DIAGNOSIS — J449 Chronic obstructive pulmonary disease, unspecified: Secondary | ICD-10-CM | POA: Diagnosis not present

## 2016-02-02 DIAGNOSIS — D649 Anemia, unspecified: Secondary | ICD-10-CM | POA: Diagnosis not present

## 2016-02-02 DIAGNOSIS — G4730 Sleep apnea, unspecified: Secondary | ICD-10-CM | POA: Diagnosis not present

## 2016-02-02 DIAGNOSIS — I10 Essential (primary) hypertension: Secondary | ICD-10-CM | POA: Diagnosis not present

## 2016-02-02 DIAGNOSIS — M1991 Primary osteoarthritis, unspecified site: Secondary | ICD-10-CM | POA: Diagnosis not present

## 2016-02-02 DIAGNOSIS — I2510 Atherosclerotic heart disease of native coronary artery without angina pectoris: Secondary | ICD-10-CM | POA: Diagnosis not present

## 2016-02-02 DIAGNOSIS — E785 Hyperlipidemia, unspecified: Secondary | ICD-10-CM | POA: Diagnosis not present

## 2016-02-02 DIAGNOSIS — K219 Gastro-esophageal reflux disease without esophagitis: Secondary | ICD-10-CM | POA: Diagnosis not present

## 2016-02-02 DIAGNOSIS — E1140 Type 2 diabetes mellitus with diabetic neuropathy, unspecified: Secondary | ICD-10-CM | POA: Diagnosis not present

## 2016-02-03 DIAGNOSIS — E559 Vitamin D deficiency, unspecified: Secondary | ICD-10-CM | POA: Diagnosis not present

## 2016-02-03 DIAGNOSIS — D649 Anemia, unspecified: Secondary | ICD-10-CM | POA: Diagnosis not present

## 2016-02-03 DIAGNOSIS — E785 Hyperlipidemia, unspecified: Secondary | ICD-10-CM | POA: Diagnosis not present

## 2016-02-03 DIAGNOSIS — J449 Chronic obstructive pulmonary disease, unspecified: Secondary | ICD-10-CM | POA: Diagnosis not present

## 2016-02-03 DIAGNOSIS — I10 Essential (primary) hypertension: Secondary | ICD-10-CM | POA: Diagnosis not present

## 2016-02-03 DIAGNOSIS — Z471 Aftercare following joint replacement surgery: Secondary | ICD-10-CM | POA: Diagnosis not present

## 2016-02-03 DIAGNOSIS — I2510 Atherosclerotic heart disease of native coronary artery without angina pectoris: Secondary | ICD-10-CM | POA: Diagnosis not present

## 2016-02-03 DIAGNOSIS — E1140 Type 2 diabetes mellitus with diabetic neuropathy, unspecified: Secondary | ICD-10-CM | POA: Diagnosis not present

## 2016-02-03 DIAGNOSIS — K219 Gastro-esophageal reflux disease without esophagitis: Secondary | ICD-10-CM | POA: Diagnosis not present

## 2016-02-03 DIAGNOSIS — M1991 Primary osteoarthritis, unspecified site: Secondary | ICD-10-CM | POA: Diagnosis not present

## 2016-02-03 DIAGNOSIS — G4730 Sleep apnea, unspecified: Secondary | ICD-10-CM | POA: Diagnosis not present

## 2016-02-04 DIAGNOSIS — Z96651 Presence of right artificial knee joint: Secondary | ICD-10-CM | POA: Diagnosis not present

## 2016-02-04 DIAGNOSIS — Z471 Aftercare following joint replacement surgery: Secondary | ICD-10-CM | POA: Diagnosis not present

## 2016-02-04 DIAGNOSIS — M25461 Effusion, right knee: Secondary | ICD-10-CM | POA: Diagnosis not present

## 2016-02-04 DIAGNOSIS — J449 Chronic obstructive pulmonary disease, unspecified: Secondary | ICD-10-CM | POA: Diagnosis not present

## 2016-02-05 DIAGNOSIS — I10 Essential (primary) hypertension: Secondary | ICD-10-CM | POA: Diagnosis not present

## 2016-02-05 DIAGNOSIS — Z471 Aftercare following joint replacement surgery: Secondary | ICD-10-CM | POA: Diagnosis not present

## 2016-02-05 DIAGNOSIS — K219 Gastro-esophageal reflux disease without esophagitis: Secondary | ICD-10-CM | POA: Diagnosis not present

## 2016-02-05 DIAGNOSIS — G4730 Sleep apnea, unspecified: Secondary | ICD-10-CM | POA: Diagnosis not present

## 2016-02-05 DIAGNOSIS — M1991 Primary osteoarthritis, unspecified site: Secondary | ICD-10-CM | POA: Diagnosis not present

## 2016-02-05 DIAGNOSIS — J449 Chronic obstructive pulmonary disease, unspecified: Secondary | ICD-10-CM | POA: Diagnosis not present

## 2016-02-05 DIAGNOSIS — E559 Vitamin D deficiency, unspecified: Secondary | ICD-10-CM | POA: Diagnosis not present

## 2016-02-05 DIAGNOSIS — D649 Anemia, unspecified: Secondary | ICD-10-CM | POA: Diagnosis not present

## 2016-02-05 DIAGNOSIS — E1140 Type 2 diabetes mellitus with diabetic neuropathy, unspecified: Secondary | ICD-10-CM | POA: Diagnosis not present

## 2016-02-05 DIAGNOSIS — E785 Hyperlipidemia, unspecified: Secondary | ICD-10-CM | POA: Diagnosis not present

## 2016-02-05 DIAGNOSIS — I2510 Atherosclerotic heart disease of native coronary artery without angina pectoris: Secondary | ICD-10-CM | POA: Diagnosis not present

## 2016-02-07 DIAGNOSIS — D649 Anemia, unspecified: Secondary | ICD-10-CM | POA: Diagnosis not present

## 2016-02-07 DIAGNOSIS — E785 Hyperlipidemia, unspecified: Secondary | ICD-10-CM | POA: Diagnosis not present

## 2016-02-07 DIAGNOSIS — I10 Essential (primary) hypertension: Secondary | ICD-10-CM | POA: Diagnosis not present

## 2016-02-07 DIAGNOSIS — E1140 Type 2 diabetes mellitus with diabetic neuropathy, unspecified: Secondary | ICD-10-CM | POA: Diagnosis not present

## 2016-02-07 DIAGNOSIS — J449 Chronic obstructive pulmonary disease, unspecified: Secondary | ICD-10-CM | POA: Diagnosis not present

## 2016-02-07 DIAGNOSIS — Z471 Aftercare following joint replacement surgery: Secondary | ICD-10-CM | POA: Diagnosis not present

## 2016-02-07 DIAGNOSIS — M1991 Primary osteoarthritis, unspecified site: Secondary | ICD-10-CM | POA: Diagnosis not present

## 2016-02-07 DIAGNOSIS — K219 Gastro-esophageal reflux disease without esophagitis: Secondary | ICD-10-CM | POA: Diagnosis not present

## 2016-02-07 DIAGNOSIS — I2510 Atherosclerotic heart disease of native coronary artery without angina pectoris: Secondary | ICD-10-CM | POA: Diagnosis not present

## 2016-02-07 DIAGNOSIS — E559 Vitamin D deficiency, unspecified: Secondary | ICD-10-CM | POA: Diagnosis not present

## 2016-02-07 DIAGNOSIS — G4730 Sleep apnea, unspecified: Secondary | ICD-10-CM | POA: Diagnosis not present

## 2016-02-10 DIAGNOSIS — M25561 Pain in right knee: Secondary | ICD-10-CM | POA: Diagnosis not present

## 2016-02-10 DIAGNOSIS — M1711 Unilateral primary osteoarthritis, right knee: Secondary | ICD-10-CM | POA: Diagnosis not present

## 2016-02-10 DIAGNOSIS — R262 Difficulty in walking, not elsewhere classified: Secondary | ICD-10-CM | POA: Diagnosis not present

## 2016-02-12 DIAGNOSIS — M1990 Unspecified osteoarthritis, unspecified site: Secondary | ICD-10-CM | POA: Insufficient documentation

## 2016-02-12 DIAGNOSIS — J84115 Respiratory bronchiolitis interstitial lung disease: Secondary | ICD-10-CM | POA: Insufficient documentation

## 2016-02-12 DIAGNOSIS — G4734 Idiopathic sleep related nonobstructive alveolar hypoventilation: Secondary | ICD-10-CM

## 2016-02-12 DIAGNOSIS — J849 Interstitial pulmonary disease, unspecified: Secondary | ICD-10-CM

## 2016-02-12 DIAGNOSIS — J449 Chronic obstructive pulmonary disease, unspecified: Secondary | ICD-10-CM | POA: Insufficient documentation

## 2016-02-12 DIAGNOSIS — I2699 Other pulmonary embolism without acute cor pulmonale: Secondary | ICD-10-CM | POA: Diagnosis not present

## 2016-02-12 HISTORY — DX: Unspecified osteoarthritis, unspecified site: M19.90

## 2016-02-12 HISTORY — DX: Interstitial pulmonary disease, unspecified: J84.9

## 2016-02-12 HISTORY — DX: Chronic obstructive pulmonary disease, unspecified: J44.9

## 2016-02-12 HISTORY — DX: Idiopathic sleep related nonobstructive alveolar hypoventilation: G47.34

## 2016-02-12 HISTORY — DX: Respiratory bronchiolitis interstitial lung disease: J84.115

## 2016-02-14 DIAGNOSIS — M25561 Pain in right knee: Secondary | ICD-10-CM | POA: Diagnosis not present

## 2016-02-14 DIAGNOSIS — R262 Difficulty in walking, not elsewhere classified: Secondary | ICD-10-CM | POA: Diagnosis not present

## 2016-02-14 DIAGNOSIS — M1711 Unilateral primary osteoarthritis, right knee: Secondary | ICD-10-CM | POA: Diagnosis not present

## 2016-02-19 DIAGNOSIS — M25561 Pain in right knee: Secondary | ICD-10-CM | POA: Diagnosis not present

## 2016-02-19 DIAGNOSIS — M5030 Other cervical disc degeneration, unspecified cervical region: Secondary | ICD-10-CM | POA: Diagnosis not present

## 2016-02-19 DIAGNOSIS — G894 Chronic pain syndrome: Secondary | ICD-10-CM | POA: Diagnosis not present

## 2016-02-19 DIAGNOSIS — M5106 Intervertebral disc disorders with myelopathy, lumbar region: Secondary | ICD-10-CM | POA: Diagnosis not present

## 2016-02-21 DIAGNOSIS — M1711 Unilateral primary osteoarthritis, right knee: Secondary | ICD-10-CM | POA: Diagnosis not present

## 2016-02-21 DIAGNOSIS — R262 Difficulty in walking, not elsewhere classified: Secondary | ICD-10-CM | POA: Diagnosis not present

## 2016-02-21 DIAGNOSIS — M25561 Pain in right knee: Secondary | ICD-10-CM | POA: Diagnosis not present

## 2016-02-27 DIAGNOSIS — E785 Hyperlipidemia, unspecified: Secondary | ICD-10-CM | POA: Diagnosis not present

## 2016-02-27 DIAGNOSIS — E088 Diabetes mellitus due to underlying condition with unspecified complications: Secondary | ICD-10-CM | POA: Diagnosis not present

## 2016-02-27 DIAGNOSIS — I10 Essential (primary) hypertension: Secondary | ICD-10-CM | POA: Diagnosis not present

## 2016-02-27 DIAGNOSIS — I2510 Atherosclerotic heart disease of native coronary artery without angina pectoris: Secondary | ICD-10-CM | POA: Diagnosis not present

## 2016-03-02 DIAGNOSIS — M1711 Unilateral primary osteoarthritis, right knee: Secondary | ICD-10-CM | POA: Diagnosis not present

## 2016-03-02 DIAGNOSIS — M25561 Pain in right knee: Secondary | ICD-10-CM | POA: Diagnosis not present

## 2016-03-02 DIAGNOSIS — R262 Difficulty in walking, not elsewhere classified: Secondary | ICD-10-CM | POA: Diagnosis not present

## 2016-03-05 DIAGNOSIS — J449 Chronic obstructive pulmonary disease, unspecified: Secondary | ICD-10-CM | POA: Diagnosis not present

## 2016-03-13 DIAGNOSIS — G4711 Idiopathic hypersomnia with long sleep time: Secondary | ICD-10-CM | POA: Diagnosis not present

## 2016-03-16 DIAGNOSIS — N6452 Nipple discharge: Secondary | ICD-10-CM | POA: Diagnosis not present

## 2016-03-16 DIAGNOSIS — Z1231 Encounter for screening mammogram for malignant neoplasm of breast: Secondary | ICD-10-CM | POA: Diagnosis not present

## 2016-03-16 DIAGNOSIS — Z853 Personal history of malignant neoplasm of breast: Secondary | ICD-10-CM | POA: Diagnosis not present

## 2016-03-20 DIAGNOSIS — M1711 Unilateral primary osteoarthritis, right knee: Secondary | ICD-10-CM | POA: Diagnosis not present

## 2016-03-20 DIAGNOSIS — M25561 Pain in right knee: Secondary | ICD-10-CM | POA: Diagnosis not present

## 2016-03-20 DIAGNOSIS — R262 Difficulty in walking, not elsewhere classified: Secondary | ICD-10-CM | POA: Diagnosis not present

## 2016-03-23 DIAGNOSIS — F17200 Nicotine dependence, unspecified, uncomplicated: Secondary | ICD-10-CM | POA: Diagnosis not present

## 2016-03-23 DIAGNOSIS — R1314 Dysphagia, pharyngoesophageal phase: Secondary | ICD-10-CM | POA: Insufficient documentation

## 2016-03-23 HISTORY — DX: Dysphagia, pharyngoesophageal phase: R13.14

## 2016-04-01 DIAGNOSIS — E119 Type 2 diabetes mellitus without complications: Secondary | ICD-10-CM | POA: Diagnosis not present

## 2016-04-01 DIAGNOSIS — M159 Polyosteoarthritis, unspecified: Secondary | ICD-10-CM | POA: Diagnosis not present

## 2016-04-01 DIAGNOSIS — E782 Mixed hyperlipidemia: Secondary | ICD-10-CM | POA: Diagnosis not present

## 2016-04-01 DIAGNOSIS — Z79899 Other long term (current) drug therapy: Secondary | ICD-10-CM | POA: Diagnosis not present

## 2016-04-01 DIAGNOSIS — E559 Vitamin D deficiency, unspecified: Secondary | ICD-10-CM | POA: Diagnosis not present

## 2016-04-05 DIAGNOSIS — J449 Chronic obstructive pulmonary disease, unspecified: Secondary | ICD-10-CM | POA: Diagnosis not present

## 2016-04-06 DIAGNOSIS — R262 Difficulty in walking, not elsewhere classified: Secondary | ICD-10-CM | POA: Diagnosis not present

## 2016-04-06 DIAGNOSIS — M1711 Unilateral primary osteoarthritis, right knee: Secondary | ICD-10-CM | POA: Diagnosis not present

## 2016-04-06 DIAGNOSIS — M25561 Pain in right knee: Secondary | ICD-10-CM | POA: Diagnosis not present

## 2016-04-10 DIAGNOSIS — Z79899 Other long term (current) drug therapy: Secondary | ICD-10-CM | POA: Diagnosis not present

## 2016-04-10 DIAGNOSIS — M542 Cervicalgia: Secondary | ICD-10-CM | POA: Diagnosis not present

## 2016-04-10 DIAGNOSIS — Z79891 Long term (current) use of opiate analgesic: Secondary | ICD-10-CM | POA: Diagnosis not present

## 2016-04-10 DIAGNOSIS — M5106 Intervertebral disc disorders with myelopathy, lumbar region: Secondary | ICD-10-CM | POA: Diagnosis not present

## 2016-04-10 DIAGNOSIS — G894 Chronic pain syndrome: Secondary | ICD-10-CM | POA: Diagnosis not present

## 2016-04-28 DIAGNOSIS — J849 Interstitial pulmonary disease, unspecified: Secondary | ICD-10-CM | POA: Diagnosis not present

## 2016-04-28 DIAGNOSIS — J449 Chronic obstructive pulmonary disease, unspecified: Secondary | ICD-10-CM | POA: Diagnosis not present

## 2016-04-28 DIAGNOSIS — I2699 Other pulmonary embolism without acute cor pulmonale: Secondary | ICD-10-CM | POA: Diagnosis not present

## 2016-04-28 DIAGNOSIS — G4734 Idiopathic sleep related nonobstructive alveolar hypoventilation: Secondary | ICD-10-CM | POA: Diagnosis not present

## 2016-05-06 DIAGNOSIS — J449 Chronic obstructive pulmonary disease, unspecified: Secondary | ICD-10-CM | POA: Diagnosis not present

## 2016-05-08 DIAGNOSIS — M5030 Other cervical disc degeneration, unspecified cervical region: Secondary | ICD-10-CM | POA: Diagnosis not present

## 2016-05-08 DIAGNOSIS — G894 Chronic pain syndrome: Secondary | ICD-10-CM | POA: Diagnosis not present

## 2016-05-08 DIAGNOSIS — M5106 Intervertebral disc disorders with myelopathy, lumbar region: Secondary | ICD-10-CM | POA: Diagnosis not present

## 2016-05-08 DIAGNOSIS — M542 Cervicalgia: Secondary | ICD-10-CM | POA: Diagnosis not present

## 2016-05-13 DIAGNOSIS — G4711 Idiopathic hypersomnia with long sleep time: Secondary | ICD-10-CM | POA: Diagnosis not present

## 2016-06-05 DIAGNOSIS — M545 Low back pain: Secondary | ICD-10-CM | POA: Diagnosis not present

## 2016-06-05 DIAGNOSIS — G894 Chronic pain syndrome: Secondary | ICD-10-CM | POA: Diagnosis not present

## 2016-06-05 DIAGNOSIS — J449 Chronic obstructive pulmonary disease, unspecified: Secondary | ICD-10-CM | POA: Diagnosis not present

## 2016-06-05 DIAGNOSIS — M5106 Intervertebral disc disorders with myelopathy, lumbar region: Secondary | ICD-10-CM | POA: Diagnosis not present

## 2016-06-17 DIAGNOSIS — Z95820 Peripheral vascular angioplasty status with implants and grafts: Secondary | ICD-10-CM | POA: Diagnosis not present

## 2016-06-17 DIAGNOSIS — I6523 Occlusion and stenosis of bilateral carotid arteries: Secondary | ICD-10-CM | POA: Diagnosis not present

## 2016-06-29 DIAGNOSIS — Z23 Encounter for immunization: Secondary | ICD-10-CM | POA: Diagnosis not present

## 2016-06-29 DIAGNOSIS — J309 Allergic rhinitis, unspecified: Secondary | ICD-10-CM | POA: Diagnosis not present

## 2016-06-29 DIAGNOSIS — E119 Type 2 diabetes mellitus without complications: Secondary | ICD-10-CM | POA: Diagnosis not present

## 2016-06-29 DIAGNOSIS — R300 Dysuria: Secondary | ICD-10-CM | POA: Diagnosis not present

## 2016-06-29 DIAGNOSIS — Z1389 Encounter for screening for other disorder: Secondary | ICD-10-CM | POA: Diagnosis not present

## 2016-06-29 DIAGNOSIS — I10 Essential (primary) hypertension: Secondary | ICD-10-CM | POA: Diagnosis not present

## 2016-07-06 DIAGNOSIS — J449 Chronic obstructive pulmonary disease, unspecified: Secondary | ICD-10-CM | POA: Diagnosis not present

## 2016-07-07 DIAGNOSIS — M4726 Other spondylosis with radiculopathy, lumbar region: Secondary | ICD-10-CM | POA: Diagnosis not present

## 2016-07-07 DIAGNOSIS — M542 Cervicalgia: Secondary | ICD-10-CM | POA: Diagnosis not present

## 2016-07-07 DIAGNOSIS — G894 Chronic pain syndrome: Secondary | ICD-10-CM | POA: Diagnosis not present

## 2016-07-07 DIAGNOSIS — M47892 Other spondylosis, cervical region: Secondary | ICD-10-CM | POA: Diagnosis not present

## 2016-07-08 DIAGNOSIS — Z96651 Presence of right artificial knee joint: Secondary | ICD-10-CM | POA: Diagnosis not present

## 2016-07-08 DIAGNOSIS — Z471 Aftercare following joint replacement surgery: Secondary | ICD-10-CM | POA: Diagnosis not present

## 2016-07-23 DIAGNOSIS — J849 Interstitial pulmonary disease, unspecified: Secondary | ICD-10-CM | POA: Diagnosis not present

## 2016-07-28 DIAGNOSIS — J449 Chronic obstructive pulmonary disease, unspecified: Secondary | ICD-10-CM | POA: Diagnosis not present

## 2016-07-28 DIAGNOSIS — G4734 Idiopathic sleep related nonobstructive alveolar hypoventilation: Secondary | ICD-10-CM | POA: Diagnosis not present

## 2016-07-28 DIAGNOSIS — M1990 Unspecified osteoarthritis, unspecified site: Secondary | ICD-10-CM | POA: Diagnosis not present

## 2016-07-28 DIAGNOSIS — G2581 Restless legs syndrome: Secondary | ICD-10-CM | POA: Insufficient documentation

## 2016-07-28 DIAGNOSIS — J849 Interstitial pulmonary disease, unspecified: Secondary | ICD-10-CM | POA: Diagnosis not present

## 2016-07-28 HISTORY — DX: Restless legs syndrome: G25.81

## 2016-07-31 DIAGNOSIS — R768 Other specified abnormal immunological findings in serum: Secondary | ICD-10-CM | POA: Diagnosis not present

## 2016-08-05 DIAGNOSIS — M545 Low back pain: Secondary | ICD-10-CM | POA: Diagnosis not present

## 2016-08-05 DIAGNOSIS — J449 Chronic obstructive pulmonary disease, unspecified: Secondary | ICD-10-CM | POA: Diagnosis not present

## 2016-08-05 DIAGNOSIS — M4726 Other spondylosis with radiculopathy, lumbar region: Secondary | ICD-10-CM | POA: Diagnosis not present

## 2016-08-06 DIAGNOSIS — M47892 Other spondylosis, cervical region: Secondary | ICD-10-CM | POA: Diagnosis not present

## 2016-08-06 DIAGNOSIS — M5106 Intervertebral disc disorders with myelopathy, lumbar region: Secondary | ICD-10-CM | POA: Diagnosis not present

## 2016-08-06 DIAGNOSIS — G894 Chronic pain syndrome: Secondary | ICD-10-CM | POA: Diagnosis not present

## 2016-08-12 DIAGNOSIS — M79661 Pain in right lower leg: Secondary | ICD-10-CM | POA: Diagnosis not present

## 2016-08-12 DIAGNOSIS — M79604 Pain in right leg: Secondary | ICD-10-CM | POA: Diagnosis not present

## 2016-08-12 DIAGNOSIS — I2510 Atherosclerotic heart disease of native coronary artery without angina pectoris: Secondary | ICD-10-CM | POA: Diagnosis not present

## 2016-08-12 DIAGNOSIS — Z86718 Personal history of other venous thrombosis and embolism: Secondary | ICD-10-CM | POA: Diagnosis not present

## 2016-08-12 DIAGNOSIS — I82409 Acute embolism and thrombosis of unspecified deep veins of unspecified lower extremity: Secondary | ICD-10-CM | POA: Diagnosis not present

## 2016-08-12 DIAGNOSIS — M7989 Other specified soft tissue disorders: Secondary | ICD-10-CM | POA: Diagnosis not present

## 2016-08-13 DIAGNOSIS — G4711 Idiopathic hypersomnia with long sleep time: Secondary | ICD-10-CM | POA: Diagnosis not present

## 2016-08-13 DIAGNOSIS — Z79899 Other long term (current) drug therapy: Secondary | ICD-10-CM | POA: Diagnosis not present

## 2016-08-19 DIAGNOSIS — E785 Hyperlipidemia, unspecified: Secondary | ICD-10-CM | POA: Diagnosis not present

## 2016-08-19 DIAGNOSIS — I6523 Occlusion and stenosis of bilateral carotid arteries: Secondary | ICD-10-CM | POA: Diagnosis not present

## 2016-08-19 DIAGNOSIS — I10 Essential (primary) hypertension: Secondary | ICD-10-CM | POA: Diagnosis not present

## 2016-09-03 DIAGNOSIS — G894 Chronic pain syndrome: Secondary | ICD-10-CM | POA: Diagnosis not present

## 2016-09-03 DIAGNOSIS — M545 Low back pain: Secondary | ICD-10-CM | POA: Diagnosis not present

## 2016-09-03 DIAGNOSIS — M791 Myalgia: Secondary | ICD-10-CM | POA: Diagnosis not present

## 2016-09-05 DIAGNOSIS — J449 Chronic obstructive pulmonary disease, unspecified: Secondary | ICD-10-CM | POA: Diagnosis not present

## 2016-09-29 DIAGNOSIS — D491 Neoplasm of unspecified behavior of respiratory system: Secondary | ICD-10-CM | POA: Insufficient documentation

## 2016-09-29 DIAGNOSIS — R05 Cough: Secondary | ICD-10-CM | POA: Diagnosis not present

## 2016-09-29 DIAGNOSIS — J370 Chronic laryngitis: Secondary | ICD-10-CM | POA: Insufficient documentation

## 2016-09-29 DIAGNOSIS — R059 Cough, unspecified: Secondary | ICD-10-CM

## 2016-09-29 HISTORY — DX: Chronic laryngitis: J37.0

## 2016-09-29 HISTORY — DX: Neoplasm of unspecified behavior of respiratory system: D49.1

## 2016-09-29 HISTORY — DX: Cough, unspecified: R05.9

## 2016-10-02 DIAGNOSIS — M5106 Intervertebral disc disorders with myelopathy, lumbar region: Secondary | ICD-10-CM | POA: Diagnosis not present

## 2016-10-02 DIAGNOSIS — G894 Chronic pain syndrome: Secondary | ICD-10-CM | POA: Diagnosis not present

## 2016-10-02 DIAGNOSIS — M542 Cervicalgia: Secondary | ICD-10-CM | POA: Diagnosis not present

## 2016-10-06 DIAGNOSIS — Z79899 Other long term (current) drug therapy: Secondary | ICD-10-CM | POA: Diagnosis not present

## 2016-10-06 DIAGNOSIS — Z0000 Encounter for general adult medical examination without abnormal findings: Secondary | ICD-10-CM | POA: Diagnosis not present

## 2016-10-06 DIAGNOSIS — E559 Vitamin D deficiency, unspecified: Secondary | ICD-10-CM | POA: Diagnosis not present

## 2016-10-06 DIAGNOSIS — M159 Polyosteoarthritis, unspecified: Secondary | ICD-10-CM | POA: Diagnosis not present

## 2016-10-06 DIAGNOSIS — E119 Type 2 diabetes mellitus without complications: Secondary | ICD-10-CM | POA: Diagnosis not present

## 2016-10-06 DIAGNOSIS — J449 Chronic obstructive pulmonary disease, unspecified: Secondary | ICD-10-CM | POA: Diagnosis not present

## 2016-10-06 DIAGNOSIS — I10 Essential (primary) hypertension: Secondary | ICD-10-CM | POA: Diagnosis not present

## 2016-10-06 DIAGNOSIS — E782 Mixed hyperlipidemia: Secondary | ICD-10-CM | POA: Diagnosis not present

## 2016-10-29 DIAGNOSIS — M545 Low back pain: Secondary | ICD-10-CM | POA: Diagnosis not present

## 2016-10-29 DIAGNOSIS — M542 Cervicalgia: Secondary | ICD-10-CM | POA: Diagnosis not present

## 2016-10-29 DIAGNOSIS — Z79899 Other long term (current) drug therapy: Secondary | ICD-10-CM | POA: Diagnosis not present

## 2016-10-29 DIAGNOSIS — G894 Chronic pain syndrome: Secondary | ICD-10-CM | POA: Diagnosis not present

## 2016-10-29 DIAGNOSIS — M5106 Intervertebral disc disorders with myelopathy, lumbar region: Secondary | ICD-10-CM | POA: Diagnosis not present

## 2016-10-29 DIAGNOSIS — Z79891 Long term (current) use of opiate analgesic: Secondary | ICD-10-CM | POA: Diagnosis not present

## 2016-10-30 DIAGNOSIS — C329 Malignant neoplasm of larynx, unspecified: Secondary | ICD-10-CM | POA: Diagnosis not present

## 2016-10-30 DIAGNOSIS — D491 Neoplasm of unspecified behavior of respiratory system: Secondary | ICD-10-CM | POA: Diagnosis not present

## 2016-10-30 DIAGNOSIS — J383 Other diseases of vocal cords: Secondary | ICD-10-CM | POA: Diagnosis not present

## 2016-11-02 DIAGNOSIS — H35033 Hypertensive retinopathy, bilateral: Secondary | ICD-10-CM | POA: Diagnosis not present

## 2016-11-02 DIAGNOSIS — E119 Type 2 diabetes mellitus without complications: Secondary | ICD-10-CM | POA: Diagnosis not present

## 2016-11-02 DIAGNOSIS — H52223 Regular astigmatism, bilateral: Secondary | ICD-10-CM | POA: Diagnosis not present

## 2016-11-02 DIAGNOSIS — H43393 Other vitreous opacities, bilateral: Secondary | ICD-10-CM | POA: Diagnosis not present

## 2016-11-03 DIAGNOSIS — J449 Chronic obstructive pulmonary disease, unspecified: Secondary | ICD-10-CM | POA: Diagnosis not present

## 2016-11-19 DIAGNOSIS — G4711 Idiopathic hypersomnia with long sleep time: Secondary | ICD-10-CM | POA: Diagnosis not present

## 2016-11-26 DIAGNOSIS — G894 Chronic pain syndrome: Secondary | ICD-10-CM | POA: Diagnosis not present

## 2016-11-26 DIAGNOSIS — M545 Low back pain: Secondary | ICD-10-CM | POA: Diagnosis not present

## 2016-11-26 DIAGNOSIS — M542 Cervicalgia: Secondary | ICD-10-CM | POA: Diagnosis not present

## 2016-11-27 DIAGNOSIS — R05 Cough: Secondary | ICD-10-CM | POA: Diagnosis not present

## 2016-11-27 DIAGNOSIS — G4734 Idiopathic sleep related nonobstructive alveolar hypoventilation: Secondary | ICD-10-CM | POA: Diagnosis not present

## 2016-11-27 DIAGNOSIS — J449 Chronic obstructive pulmonary disease, unspecified: Secondary | ICD-10-CM | POA: Diagnosis not present

## 2016-12-04 DIAGNOSIS — J449 Chronic obstructive pulmonary disease, unspecified: Secondary | ICD-10-CM | POA: Diagnosis not present

## 2016-12-24 DIAGNOSIS — M542 Cervicalgia: Secondary | ICD-10-CM | POA: Diagnosis not present

## 2016-12-24 DIAGNOSIS — G894 Chronic pain syndrome: Secondary | ICD-10-CM | POA: Diagnosis not present

## 2016-12-24 DIAGNOSIS — M4726 Other spondylosis with radiculopathy, lumbar region: Secondary | ICD-10-CM | POA: Diagnosis not present

## 2016-12-24 DIAGNOSIS — M5106 Intervertebral disc disorders with myelopathy, lumbar region: Secondary | ICD-10-CM | POA: Diagnosis not present

## 2017-01-03 DIAGNOSIS — J449 Chronic obstructive pulmonary disease, unspecified: Secondary | ICD-10-CM | POA: Diagnosis not present

## 2017-01-06 DIAGNOSIS — J029 Acute pharyngitis, unspecified: Secondary | ICD-10-CM | POA: Diagnosis not present

## 2017-01-06 DIAGNOSIS — H68003 Unspecified Eustachian salpingitis, bilateral: Secondary | ICD-10-CM | POA: Diagnosis not present

## 2017-01-06 DIAGNOSIS — J440 Chronic obstructive pulmonary disease with acute lower respiratory infection: Secondary | ICD-10-CM | POA: Diagnosis not present

## 2017-01-06 DIAGNOSIS — R42 Dizziness and giddiness: Secondary | ICD-10-CM | POA: Diagnosis not present

## 2017-01-19 DIAGNOSIS — I70203 Unspecified atherosclerosis of native arteries of extremities, bilateral legs: Secondary | ICD-10-CM | POA: Diagnosis not present

## 2017-01-27 DIAGNOSIS — Z471 Aftercare following joint replacement surgery: Secondary | ICD-10-CM | POA: Diagnosis not present

## 2017-01-27 DIAGNOSIS — M25561 Pain in right knee: Secondary | ICD-10-CM | POA: Diagnosis not present

## 2017-01-27 DIAGNOSIS — M25461 Effusion, right knee: Secondary | ICD-10-CM | POA: Diagnosis not present

## 2017-01-27 DIAGNOSIS — R936 Abnormal findings on diagnostic imaging of limbs: Secondary | ICD-10-CM | POA: Diagnosis not present

## 2017-01-27 DIAGNOSIS — M2341 Loose body in knee, right knee: Secondary | ICD-10-CM | POA: Diagnosis not present

## 2017-01-27 DIAGNOSIS — Z96651 Presence of right artificial knee joint: Secondary | ICD-10-CM | POA: Diagnosis not present

## 2017-01-27 DIAGNOSIS — M76891 Other specified enthesopathies of right lower limb, excluding foot: Secondary | ICD-10-CM | POA: Diagnosis not present

## 2017-01-29 DIAGNOSIS — M5106 Intervertebral disc disorders with myelopathy, lumbar region: Secondary | ICD-10-CM | POA: Diagnosis not present

## 2017-01-29 DIAGNOSIS — Z79899 Other long term (current) drug therapy: Secondary | ICD-10-CM | POA: Diagnosis not present

## 2017-01-29 DIAGNOSIS — M791 Myalgia: Secondary | ICD-10-CM | POA: Diagnosis not present

## 2017-01-29 DIAGNOSIS — M542 Cervicalgia: Secondary | ICD-10-CM | POA: Diagnosis not present

## 2017-01-29 DIAGNOSIS — G894 Chronic pain syndrome: Secondary | ICD-10-CM | POA: Diagnosis not present

## 2017-01-29 DIAGNOSIS — Z79891 Long term (current) use of opiate analgesic: Secondary | ICD-10-CM | POA: Diagnosis not present

## 2017-02-03 DIAGNOSIS — J449 Chronic obstructive pulmonary disease, unspecified: Secondary | ICD-10-CM | POA: Diagnosis not present

## 2017-02-04 ENCOUNTER — Encounter: Payer: Self-pay | Admitting: Cardiology

## 2017-02-04 DIAGNOSIS — R110 Nausea: Secondary | ICD-10-CM | POA: Diagnosis not present

## 2017-02-04 DIAGNOSIS — I2510 Atherosclerotic heart disease of native coronary artery without angina pectoris: Secondary | ICD-10-CM | POA: Diagnosis not present

## 2017-02-17 DIAGNOSIS — E782 Mixed hyperlipidemia: Secondary | ICD-10-CM | POA: Diagnosis not present

## 2017-02-17 DIAGNOSIS — E1159 Type 2 diabetes mellitus with other circulatory complications: Secondary | ICD-10-CM | POA: Diagnosis not present

## 2017-02-17 DIAGNOSIS — E119 Type 2 diabetes mellitus without complications: Secondary | ICD-10-CM | POA: Diagnosis not present

## 2017-02-17 DIAGNOSIS — I70213 Atherosclerosis of native arteries of extremities with intermittent claudication, bilateral legs: Secondary | ICD-10-CM | POA: Diagnosis not present

## 2017-02-17 DIAGNOSIS — I10 Essential (primary) hypertension: Secondary | ICD-10-CM | POA: Diagnosis not present

## 2017-02-17 DIAGNOSIS — E785 Hyperlipidemia, unspecified: Secondary | ICD-10-CM | POA: Diagnosis not present

## 2017-02-23 ENCOUNTER — Ambulatory Visit (HOSPITAL_BASED_OUTPATIENT_CLINIC_OR_DEPARTMENT_OTHER)
Admission: RE | Admit: 2017-02-23 | Discharge: 2017-02-23 | Disposition: A | Discharge status: Home / Self Care | Payer: Medicare Other | Source: Ambulatory Visit | Attending: Cardiology | Admitting: Cardiology

## 2017-02-23 ENCOUNTER — Encounter: Payer: Self-pay | Admitting: Cardiology

## 2017-02-23 ENCOUNTER — Ambulatory Visit (INDEPENDENT_AMBULATORY_CARE_PROVIDER_SITE_OTHER): Payer: Medicare Other | Admitting: Cardiology

## 2017-02-23 VITALS — BP 134/66 | HR 107 | Ht 62.0 in | Wt 164.0 lb

## 2017-02-23 DIAGNOSIS — Z86711 Personal history of pulmonary embolism: Secondary | ICD-10-CM | POA: Diagnosis not present

## 2017-02-23 DIAGNOSIS — F17210 Nicotine dependence, cigarettes, uncomplicated: Secondary | ICD-10-CM | POA: Diagnosis not present

## 2017-02-23 DIAGNOSIS — D491 Neoplasm of unspecified behavior of respiratory system: Secondary | ICD-10-CM | POA: Diagnosis not present

## 2017-02-23 DIAGNOSIS — E785 Hyperlipidemia, unspecified: Secondary | ICD-10-CM | POA: Diagnosis not present

## 2017-02-23 DIAGNOSIS — I10 Essential (primary) hypertension: Secondary | ICD-10-CM

## 2017-02-23 DIAGNOSIS — I2510 Atherosclerotic heart disease of native coronary artery without angina pectoris: Secondary | ICD-10-CM

## 2017-02-23 DIAGNOSIS — I700 Atherosclerosis of aorta: Secondary | ICD-10-CM | POA: Insufficient documentation

## 2017-02-23 DIAGNOSIS — Z9889 Other specified postprocedural states: Secondary | ICD-10-CM | POA: Diagnosis not present

## 2017-02-23 DIAGNOSIS — E088 Diabetes mellitus due to underlying condition with unspecified complications: Secondary | ICD-10-CM

## 2017-02-23 DIAGNOSIS — J984 Other disorders of lung: Secondary | ICD-10-CM | POA: Insufficient documentation

## 2017-02-23 DIAGNOSIS — F17200 Nicotine dependence, unspecified, uncomplicated: Secondary | ICD-10-CM | POA: Diagnosis not present

## 2017-02-23 DIAGNOSIS — I519 Heart disease, unspecified: Secondary | ICD-10-CM | POA: Diagnosis not present

## 2017-02-23 DIAGNOSIS — I25119 Atherosclerotic heart disease of native coronary artery with unspecified angina pectoris: Secondary | ICD-10-CM | POA: Diagnosis not present

## 2017-02-23 HISTORY — DX: Atherosclerotic heart disease of native coronary artery without angina pectoris: I25.10

## 2017-02-23 HISTORY — DX: Atherosclerotic heart disease of native coronary artery with unspecified angina pectoris: I25.119

## 2017-02-23 HISTORY — DX: Essential (primary) hypertension: I10

## 2017-02-23 HISTORY — DX: Personal history of pulmonary embolism: Z86.711

## 2017-02-23 NOTE — Patient Instructions (Signed)
Medication Instructions:  1.) Please hold your Eliquis for 2 doses prior to your procedure on Thursday, July 19. Do not take the night before and morning of the procedure per the request of your doctor.   Labwork: Your physician recommends that you have labs today as pre-procedure work up: CBC, BMP, PT/INR.    Testing/Procedures: A chest x-ray takes a picture of the organs and structures inside the chest, including the heart, lungs, and blood vessels. This test can show several things, including, whether the heart is enlarges; whether fluid is building up in the lungs; and whether pacemaker / defibrillator leads are still in place.  Your physician has requested that you have a cardiac catheterization. Cardiac catheterization is used to diagnose and/or treat various heart conditions. Doctors may recommend this procedure for a number of different reasons. The most common reason is to evaluate chest pain. Chest pain can be a symptom of coronary artery disease (CAD), and cardiac catheterization can show whether plaque is narrowing or blocking your heart's arteries. This procedure is also used to evaluate the valves, as well as measure the blood flow and oxygen levels in different parts of your heart. For further information please visit [email or web address]. Please follow instruction sheet, as given.   Follow-Up: Your physician recommends that you schedule a follow-up appointment in: 3-4 weeks after your procedure.   Any Other Special Instructions Will Be Listed Below (If Applicable).     If you need a refill on your cardiac medications before your next appointment, please call your pharmacy.
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
## 2017-02-24 ENCOUNTER — Telehealth: Payer: Self-pay

## 2017-02-24 LAB — CBC WITH DIFFERENTIAL/PLATELET
BASOS: 0 %
Basophils Absolute: 0 10*3/uL (ref 0.0–0.2)
EOS (ABSOLUTE): 0.2 10*3/uL (ref 0.0–0.4)
EOS: 2 %
HEMATOCRIT: 40.2 % (ref 34.0–46.6)
HEMOGLOBIN: 13.8 g/dL (ref 11.1–15.9)
Immature Grans (Abs): 0 10*3/uL (ref 0.0–0.1)
Immature Granulocytes: 0 %
LYMPHS ABS: 2.7 10*3/uL (ref 0.7–3.1)
Lymphs: 28 %
MCH: 30 pg (ref 26.6–33.0)
MCHC: 34.3 g/dL (ref 31.5–35.7)
MCV: 87 fL (ref 79–97)
MONOCYTES: 8 %
MONOS ABS: 0.8 10*3/uL (ref 0.1–0.9)
NEUTROS ABS: 5.8 10*3/uL (ref 1.4–7.0)
Neutrophils: 62 %
Platelets: 324 10*3/uL (ref 150–379)
RBC: 4.6 x10E6/uL (ref 3.77–5.28)
RDW: 14.7 % (ref 12.3–15.4)
WBC: 9.6 10*3/uL (ref 3.4–10.8)

## 2017-02-24 LAB — BASIC METABOLIC PANEL
BUN / CREAT RATIO: 16 (ref 9–23)
BUN: 13 mg/dL (ref 6–24)
CALCIUM: 9.7 mg/dL (ref 8.7–10.2)
CHLORIDE: 107 mmol/L — AB (ref 96–106)
CO2: 19 mmol/L — ABNORMAL LOW (ref 20–29)
Creatinine, Ser: 0.81 mg/dL (ref 0.57–1.00)
GFR calc non Af Amer: 80 mL/min/{1.73_m2} (ref >59)
GFR, EST AFRICAN AMERICAN: 92 mL/min/{1.73_m2} (ref >59)
Glucose: 77 mg/dL (ref 65–99)
POTASSIUM: 4.7 mmol/L (ref 3.5–5.2)
SODIUM: 140 mmol/L (ref 134–144)

## 2017-02-24 LAB — PROTIME-INR
INR: 1 (ref 0.8–1.2)
Prothrombin Time: 10.5 s (ref 9.1–12.0)

## 2017-02-24 NOTE — Telephone Encounter (Signed)
Left detailed message per DPR.  Patient contacted pre-catheterization at [facility] scheduled for:  02/25/2017 @ 1030 Verified arrival time and place:  NT @ 0800 AM meds to be taken pre-cath with sip of water:  Notified Pt to not take her Metformin today or pre procedure.  Also educated this medication needs to be held for 48 hours post procedure and why.  Requested Pt take baby asa prior to arrival.  Pt was told to only hold Eliquis tonight and tomorrow morning. Confirmed patient has responsible person to drive home post procedure and observe patient for 24 hours:  Notified Pt must have someone to drive her home and stay overnight if discharged same day. Addl concerns:  Left this nurse name and # for call back if any questions.

## 2017-02-24 NOTE — Telephone Encounter (Signed)
Left message for pt to go over instructions regarding metformin. Instructions were given yesterday however I wanted to clarify/

## 2017-02-24 NOTE — Telephone Encounter (Signed)
-----   Message from Damian Leavell, RN sent at 02/24/2017  9:42 AM EDT ----- Regarding: metformin Metformin must be held for 24 hours prior to cath because it reacts with the dye they inject to view the coronary arteries and causes kidney damage.  Pt must also hold for 48 hours after the cath for the same reason.

## 2017-02-25 ENCOUNTER — Ambulatory Visit (HOSPITAL_COMMUNITY)
Admission: RE | Admit: 2017-02-25 | Discharge: 2017-02-25 | Disposition: A | Discharge status: Home / Self Care | Payer: Medicare Other | Source: Ambulatory Visit | Attending: Interventional Cardiology | Admitting: Interventional Cardiology

## 2017-02-25 DIAGNOSIS — Z5309 Procedure and treatment not carried out because of other contraindication: Secondary | ICD-10-CM | POA: Diagnosis not present

## 2017-02-25 DIAGNOSIS — I25119 Atherosclerotic heart disease of native coronary artery with unspecified angina pectoris: Secondary | ICD-10-CM | POA: Insufficient documentation

## 2017-02-25 DIAGNOSIS — Z7901 Long term (current) use of anticoagulants: Secondary | ICD-10-CM | POA: Insufficient documentation

## 2017-02-25 LAB — GLUCOSE, CAPILLARY: Glucose-Capillary: 99 mg/dL (ref 65–99)

## 2017-02-25 MED ORDER — SODIUM CHLORIDE 0.9 % WEIGHT BASED INFUSION: 1.0000 mL/kg/h | INTRAVENOUS | Status: DC

## 2017-02-25 MED ORDER — ASPIRIN 81 MG PO CHEW
CHEWABLE_TABLET | ORAL | Status: AC
  Filled 2017-02-25: qty 1

## 2017-02-25 MED ORDER — ASPIRIN 81 MG PO CHEW: 81.0000 mg | CHEWABLE_TABLET | ORAL | Status: DC

## 2017-02-25 MED ORDER — SODIUM CHLORIDE 0.9 % IV SOLN: 250.0000 mL | INTRAVENOUS | Status: DC | PRN

## 2017-02-25 MED ORDER — SODIUM CHLORIDE 0.9 % WEIGHT BASED INFUSION: 3.0000 mL/kg/h | INTRAVENOUS | Status: DC

## 2017-02-25 MED ORDER — SODIUM CHLORIDE 0.9% FLUSH: 3.0000 mL | Freq: Two times a day (BID) | INTRAVENOUS | Status: DC

## 2017-02-25 MED ORDER — SODIUM CHLORIDE 0.9% FLUSH: 3.0000 mL | INTRAVENOUS | Status: DC | PRN

## 2017-02-25 NOTE — Progress Notes (Signed)
Pt took eliquis 7/18 @ 2115.  Eliezer Champagne RN notified Dr Irish Lack, cancel procedure today and will reschedule.

## 2017-02-26 DIAGNOSIS — G894 Chronic pain syndrome: Secondary | ICD-10-CM | POA: Diagnosis not present

## 2017-02-26 DIAGNOSIS — M5106 Intervertebral disc disorders with myelopathy, lumbar region: Secondary | ICD-10-CM | POA: Diagnosis not present

## 2017-02-26 DIAGNOSIS — M542 Cervicalgia: Secondary | ICD-10-CM | POA: Diagnosis not present

## 2017-03-03 ENCOUNTER — Telehealth: Payer: Self-pay

## 2017-03-03 NOTE — Telephone Encounter (Signed)
Left detailed message per DPR.  Patient contacted pre-catheterization at [facility] scheduled for:  03/04/2017 @ 1200 Verified arrival time and place:  NT @ 1000  AM meds to be taken pre-cath with sip of water:  Notified Pt to not take her Metformin today or pre procedure.   Requested Pt take baby asa prior to arrival.   Pt notified to hold Eliquis tonight and tomorrow morning.  Addl concerns:  Left this nurse name and # for call back if any questions.

## 2017-03-04 ENCOUNTER — Ambulatory Visit (HOSPITAL_COMMUNITY)
Admission: RE | Admit: 2017-03-04 | Discharge: 2017-03-05 | Disposition: A | Discharge status: Home / Self Care | Payer: Medicare Other | Source: Ambulatory Visit | Attending: Internal Medicine | Admitting: Internal Medicine

## 2017-03-04 ENCOUNTER — Ambulatory Visit (HOSPITAL_COMMUNITY)
Admission: RE | Disposition: A | Discharge status: Home / Self Care | Payer: Self-pay | Source: Ambulatory Visit | Attending: Internal Medicine

## 2017-03-04 DIAGNOSIS — E1140 Type 2 diabetes mellitus with diabetic neuropathy, unspecified: Secondary | ICD-10-CM | POA: Diagnosis not present

## 2017-03-04 DIAGNOSIS — F17200 Nicotine dependence, unspecified, uncomplicated: Secondary | ICD-10-CM

## 2017-03-04 DIAGNOSIS — Z955 Presence of coronary angioplasty implant and graft: Secondary | ICD-10-CM | POA: Insufficient documentation

## 2017-03-04 DIAGNOSIS — F419 Anxiety disorder, unspecified: Secondary | ICD-10-CM | POA: Insufficient documentation

## 2017-03-04 DIAGNOSIS — K219 Gastro-esophageal reflux disease without esophagitis: Secondary | ICD-10-CM | POA: Diagnosis not present

## 2017-03-04 DIAGNOSIS — E079 Disorder of thyroid, unspecified: Secondary | ICD-10-CM | POA: Insufficient documentation

## 2017-03-04 DIAGNOSIS — I6529 Occlusion and stenosis of unspecified carotid artery: Secondary | ICD-10-CM | POA: Insufficient documentation

## 2017-03-04 DIAGNOSIS — Z7984 Long term (current) use of oral hypoglycemic drugs: Secondary | ICD-10-CM | POA: Diagnosis not present

## 2017-03-04 DIAGNOSIS — I10 Essential (primary) hypertension: Secondary | ICD-10-CM | POA: Insufficient documentation

## 2017-03-04 DIAGNOSIS — Z86711 Personal history of pulmonary embolism: Secondary | ICD-10-CM | POA: Diagnosis not present

## 2017-03-04 DIAGNOSIS — I2584 Coronary atherosclerosis due to calcified coronary lesion: Secondary | ICD-10-CM | POA: Diagnosis not present

## 2017-03-04 DIAGNOSIS — J449 Chronic obstructive pulmonary disease, unspecified: Secondary | ICD-10-CM | POA: Diagnosis not present

## 2017-03-04 DIAGNOSIS — Z8673 Personal history of transient ischemic attack (TIA), and cerebral infarction without residual deficits: Secondary | ICD-10-CM | POA: Insufficient documentation

## 2017-03-04 DIAGNOSIS — E559 Vitamin D deficiency, unspecified: Secondary | ICD-10-CM | POA: Diagnosis not present

## 2017-03-04 DIAGNOSIS — F17210 Nicotine dependence, cigarettes, uncomplicated: Secondary | ICD-10-CM | POA: Insufficient documentation

## 2017-03-04 DIAGNOSIS — I252 Old myocardial infarction: Secondary | ICD-10-CM | POA: Insufficient documentation

## 2017-03-04 DIAGNOSIS — I2510 Atherosclerotic heart disease of native coronary artery without angina pectoris: Secondary | ICD-10-CM | POA: Diagnosis present

## 2017-03-04 DIAGNOSIS — E785 Hyperlipidemia, unspecified: Secondary | ICD-10-CM | POA: Insufficient documentation

## 2017-03-04 DIAGNOSIS — I25119 Atherosclerotic heart disease of native coronary artery with unspecified angina pectoris: Secondary | ICD-10-CM

## 2017-03-04 DIAGNOSIS — F329 Major depressive disorder, single episode, unspecified: Secondary | ICD-10-CM | POA: Insufficient documentation

## 2017-03-04 DIAGNOSIS — G4730 Sleep apnea, unspecified: Secondary | ICD-10-CM | POA: Insufficient documentation

## 2017-03-04 DIAGNOSIS — I209 Angina pectoris, unspecified: Secondary | ICD-10-CM | POA: Diagnosis present

## 2017-03-04 DIAGNOSIS — I208 Other forms of angina pectoris: Secondary | ICD-10-CM | POA: Diagnosis present

## 2017-03-04 DIAGNOSIS — Z7982 Long term (current) use of aspirin: Secondary | ICD-10-CM | POA: Diagnosis not present

## 2017-03-04 DIAGNOSIS — E088 Diabetes mellitus due to underlying condition with unspecified complications: Secondary | ICD-10-CM | POA: Diagnosis present

## 2017-03-04 HISTORY — PX: LEFT HEART CATH AND CORONARY ANGIOGRAPHY: CATH118249

## 2017-03-04 HISTORY — PX: CORONARY STENT INTERVENTION: CATH118234

## 2017-03-04 LAB — GLUCOSE, CAPILLARY
GLUCOSE-CAPILLARY: 92 mg/dL (ref 65–99)
Glucose-Capillary: 125 mg/dL — ABNORMAL HIGH (ref 65–99)
Glucose-Capillary: 106 mg/dL — ABNORMAL HIGH (ref 65–99)
Glucose-Capillary: 157 mg/dL — ABNORMAL HIGH (ref 65–99)

## 2017-03-04 LAB — POCT ACTIVATED CLOTTING TIME: Activated Clotting Time: 384 seconds

## 2017-03-04 SURGERY — LEFT HEART CATH AND CORONARY ANGIOGRAPHY
Anesthesia: LOCAL

## 2017-03-04 MED ORDER — CYCLOBENZAPRINE HCL 10 MG PO TABS: 10.0000 mg | ORAL_TABLET | Freq: Three times a day (TID) | ORAL | Status: DC | PRN

## 2017-03-04 MED ORDER — ACETAMINOPHEN 325 MG PO TABS
650.0000 mg | ORAL_TABLET | ORAL | Status: DC | PRN
  Administered 2017-03-04: 650 mg via ORAL
  Filled 2017-03-04: qty 2

## 2017-03-04 MED ORDER — HYDROMORPHONE HCL 2 MG PO TABS
4.0000 mg | ORAL_TABLET | Freq: Three times a day (TID) | ORAL | Status: DC
  Administered 2017-03-04 – 2017-03-05 (×2): 4 mg via ORAL
  Filled 2017-03-04 (×2): qty 2

## 2017-03-04 MED ORDER — AMPHETAMINE-DEXTROAMPHETAMINE 10 MG PO TABS
15.0000 mg | ORAL_TABLET | Freq: Two times a day (BID) | ORAL | Status: DC
  Administered 2017-03-05 (×2): 15 mg via ORAL
  Filled 2017-03-04 (×2): qty 2

## 2017-03-04 MED ORDER — HEPARIN (PORCINE) IN NACL 2-0.9 UNIT/ML-% IJ SOLN
INTRAMUSCULAR | Status: AC
  Filled 2017-03-04: qty 1000

## 2017-03-04 MED ORDER — HEPARIN (PORCINE) IN NACL 2-0.9 UNIT/ML-% IJ SOLN
INTRAMUSCULAR | Status: AC | PRN
  Administered 2017-03-04: 1000 mL

## 2017-03-04 MED ORDER — INSULIN ASPART 100 UNIT/ML ~~LOC~~ SOLN
0.0000 [IU] | Freq: Three times a day (TID) | SUBCUTANEOUS | Status: DC
  Administered 2017-03-05: 2 [IU] via SUBCUTANEOUS

## 2017-03-04 MED ORDER — HYDRALAZINE HCL 20 MG/ML IJ SOLN: 5.0000 mg | INTRAMUSCULAR | Status: AC | PRN

## 2017-03-04 MED ORDER — ONDANSETRON HCL 4 MG/2ML IJ SOLN: 4.0000 mg | Freq: Four times a day (QID) | INTRAMUSCULAR | Status: DC | PRN

## 2017-03-04 MED ORDER — MONTELUKAST SODIUM 10 MG PO TABS
10.0000 mg | ORAL_TABLET | Freq: Every day | ORAL | Status: DC
  Administered 2017-03-04: 22:00:00 10 mg via ORAL
  Filled 2017-03-04: qty 1

## 2017-03-04 MED ORDER — CLOPIDOGREL BISULFATE 75 MG PO TABS
75.0000 mg | ORAL_TABLET | Freq: Every day | ORAL | Status: DC
  Administered 2017-03-05: 08:00:00 75 mg via ORAL
  Filled 2017-03-04: qty 1

## 2017-03-04 MED ORDER — ASPIRIN EC 81 MG PO TBEC: 81.0000 mg | DELAYED_RELEASE_TABLET | Freq: Every evening | ORAL | Status: DC

## 2017-03-04 MED ORDER — FENTANYL CITRATE (PF) 100 MCG/2ML IJ SOLN
INTRAMUSCULAR | Status: DC | PRN
  Administered 2017-03-04 (×2): 12.5 ug via INTRAVENOUS
  Administered 2017-03-04: 25 ug via INTRAVENOUS

## 2017-03-04 MED ORDER — MIDAZOLAM HCL 2 MG/2ML IJ SOLN
INTRAMUSCULAR | Status: DC | PRN
  Administered 2017-03-04 (×2): 0.5 mg via INTRAVENOUS

## 2017-03-04 MED ORDER — ASPIRIN 81 MG PO CHEW
CHEWABLE_TABLET | ORAL | Status: AC
  Administered 2017-03-04: 81 mg via ORAL
  Filled 2017-03-04: qty 1

## 2017-03-04 MED ORDER — CLOPIDOGREL BISULFATE 300 MG PO TABS
ORAL_TABLET | ORAL | Status: AC
  Filled 2017-03-04: qty 1

## 2017-03-04 MED ORDER — FENOFIBRATE 160 MG PO TABS
160.0000 mg | ORAL_TABLET | Freq: Every evening | ORAL | Status: DC
  Administered 2017-03-04: 17:00:00 160 mg via ORAL
  Filled 2017-03-04: qty 1

## 2017-03-04 MED ORDER — SODIUM CHLORIDE 0.9% FLUSH: 3.0000 mL | Freq: Two times a day (BID) | INTRAVENOUS | Status: DC

## 2017-03-04 MED ORDER — INSULIN ASPART 100 UNIT/ML ~~LOC~~ SOLN: 0.0000 [IU] | Freq: Every day | SUBCUTANEOUS | Status: DC

## 2017-03-04 MED ORDER — VARENICLINE TARTRATE 1 MG PO TABS
1.0000 mg | ORAL_TABLET | Freq: Every day | ORAL | Status: DC
  Administered 2017-03-05: 1 mg via ORAL
  Filled 2017-03-04: qty 1

## 2017-03-04 MED ORDER — TAMSULOSIN HCL 0.4 MG PO CAPS
0.4000 mg | ORAL_CAPSULE | Freq: Every day | ORAL | Status: DC
  Administered 2017-03-04: 22:00:00 0.4 mg via ORAL
  Filled 2017-03-04: qty 1

## 2017-03-04 MED ORDER — BUPROPION HCL ER (XL) 150 MG PO TB24
150.0000 mg | ORAL_TABLET | Freq: Every evening | ORAL | Status: DC
  Administered 2017-03-04: 150 mg via ORAL
  Filled 2017-03-04 (×2): qty 1

## 2017-03-04 MED ORDER — FAMOTIDINE 20 MG PO TABS
20.0000 mg | ORAL_TABLET | Freq: Two times a day (BID) | ORAL | Status: DC
  Administered 2017-03-04 – 2017-03-05 (×2): 20 mg via ORAL
  Filled 2017-03-04 (×2): qty 1

## 2017-03-04 MED ORDER — ROPINIROLE HCL 0.5 MG PO TABS
0.5000 mg | ORAL_TABLET | Freq: Every day | ORAL | Status: DC | PRN
  Filled 2017-03-04: qty 1

## 2017-03-04 MED ORDER — SODIUM CHLORIDE 0.9 % IV SOLN: INTRAVENOUS | Status: AC

## 2017-03-04 MED ORDER — SODIUM CHLORIDE 0.9 % IV SOLN: 250.0000 mL | INTRAVENOUS | Status: DC | PRN

## 2017-03-04 MED ORDER — NITROGLYCERIN 1 MG/10 ML FOR IR/CATH LAB
INTRA_ARTERIAL | Status: DC | PRN
  Administered 2017-03-04 (×2): 100 ug via INTRACORONARY

## 2017-03-04 MED ORDER — PANTOPRAZOLE SODIUM 40 MG PO TBEC
40.0000 mg | DELAYED_RELEASE_TABLET | Freq: Every day | ORAL | Status: DC
  Administered 2017-03-04: 40 mg via ORAL
  Filled 2017-03-04: qty 1

## 2017-03-04 MED ORDER — SODIUM CHLORIDE 0.9% FLUSH: 3.0000 mL | INTRAVENOUS | Status: DC | PRN

## 2017-03-04 MED ORDER — LABETALOL HCL 5 MG/ML IV SOLN: 10.0000 mg | INTRAVENOUS | Status: AC | PRN

## 2017-03-04 MED ORDER — ATORVASTATIN CALCIUM 40 MG PO TABS
40.0000 mg | ORAL_TABLET | Freq: Every day | ORAL | Status: DC
  Administered 2017-03-04: 40 mg via ORAL
  Filled 2017-03-04: qty 1

## 2017-03-04 MED ORDER — DULOXETINE HCL 60 MG PO CPEP
60.0000 mg | ORAL_CAPSULE | Freq: Every evening | ORAL | Status: DC
  Administered 2017-03-04: 60 mg via ORAL
  Filled 2017-03-04 (×2): qty 1

## 2017-03-04 MED ORDER — BIVALIRUDIN TRIFLUOROACETATE 250 MG IV SOLR
INTRAVENOUS | Status: AC
  Filled 2017-03-04: qty 250

## 2017-03-04 MED ORDER — NITROGLYCERIN 0.4 MG SL SUBL: 0.4000 mg | SUBLINGUAL_TABLET | SUBLINGUAL | Status: DC | PRN

## 2017-03-04 MED ORDER — LIDOCAINE HCL (PF) 1 % IJ SOLN
INTRAMUSCULAR | Status: AC
  Filled 2017-03-04: qty 30

## 2017-03-04 MED ORDER — BIVALIRUDIN BOLUS VIA INFUSION - CUPID
INTRAVENOUS | Status: DC | PRN
  Administered 2017-03-04 (×2): 55.8 mg via INTRAVENOUS

## 2017-03-04 MED ORDER — IOPAMIDOL (ISOVUE-370) INJECTION 76%
INTRAVENOUS | Status: DC | PRN
  Administered 2017-03-04: 120 mL via INTRA_ARTERIAL

## 2017-03-04 MED ORDER — SODIUM CHLORIDE 0.9 % WEIGHT BASED INFUSION
1.0000 mL/kg/h | INTRAVENOUS | Status: DC
  Administered 2017-03-04: 250 mL via INTRAVENOUS

## 2017-03-04 MED ORDER — ACTIVE PARTNERSHIP FOR HEALTH OF YOUR HEART BOOK
Freq: Once | Status: DC
  Filled 2017-03-04: qty 1

## 2017-03-04 MED ORDER — ASPIRIN 81 MG PO CHEW
81.0000 mg | CHEWABLE_TABLET | ORAL | Status: AC
  Administered 2017-03-04: 81 mg via ORAL

## 2017-03-04 MED ORDER — CLOPIDOGREL BISULFATE 300 MG PO TABS
ORAL_TABLET | ORAL | Status: DC | PRN
  Administered 2017-03-04: 600 mg via ORAL

## 2017-03-04 MED ORDER — DILTIAZEM HCL ER COATED BEADS 120 MG PO CP24
120.0000 mg | ORAL_CAPSULE | Freq: Every day | ORAL | Status: DC
  Administered 2017-03-04: 22:00:00 120 mg via ORAL
  Filled 2017-03-04: qty 1

## 2017-03-04 MED ORDER — MIDAZOLAM HCL 2 MG/2ML IJ SOLN
INTRAMUSCULAR | Status: AC
  Filled 2017-03-04: qty 2

## 2017-03-04 MED ORDER — MELATONIN 3 MG PO TABS
9.0000 mg | ORAL_TABLET | Freq: Every day | ORAL | Status: DC
  Administered 2017-03-04: 9 mg via ORAL
  Filled 2017-03-04: qty 3

## 2017-03-04 MED ORDER — DOCUSATE SODIUM 50 MG PO CAPS
50.0000 mg | ORAL_CAPSULE | Freq: Every day | ORAL | Status: DC
  Administered 2017-03-04: 22:00:00 50 mg via ORAL
  Filled 2017-03-04: qty 1

## 2017-03-04 MED ORDER — ANGIOPLASTY BOOK
Freq: Once | Status: AC
Start: 2017-03-04 — End: 2017-03-04
  Administered 2017-03-04: 17:00:00
  Filled 2017-03-04: qty 1

## 2017-03-04 MED ORDER — LIDOCAINE HCL (PF) 1 % IJ SOLN
INTRAMUSCULAR | Status: DC | PRN
  Administered 2017-03-04: 15 mL

## 2017-03-04 MED ORDER — ROPINIROLE HCL 0.5 MG PO TABS
0.5000 mg | ORAL_TABLET | Freq: Every day | ORAL | Status: DC
  Administered 2017-03-04: 22:00:00 0.5 mg via ORAL
  Filled 2017-03-04: qty 1

## 2017-03-04 MED ORDER — NITROGLYCERIN 1 MG/10 ML FOR IR/CATH LAB
INTRA_ARTERIAL | Status: AC
  Filled 2017-03-04: qty 10

## 2017-03-04 MED ORDER — SODIUM CHLORIDE 0.9 % WEIGHT BASED INFUSION
3.0000 mL/kg/h | INTRAVENOUS | Status: DC
  Administered 2017-03-04: 3 mL/kg/h via INTRAVENOUS

## 2017-03-04 MED ORDER — FENTANYL CITRATE (PF) 100 MCG/2ML IJ SOLN
INTRAMUSCULAR | Status: AC
  Filled 2017-03-04: qty 2

## 2017-03-04 MED ORDER — MECLIZINE HCL 12.5 MG PO TABS
12.5000 mg | ORAL_TABLET | Freq: Two times a day (BID) | ORAL | Status: DC | PRN
  Filled 2017-03-04: qty 1

## 2017-03-04 SURGICAL SUPPLY — 21 items
BALLN SAPPHIRE 2.5X12 (BALLOONS) ×2
BALLN SAPPHIRE ~~LOC~~ 3.25X8 (BALLOONS) ×2 IMPLANT
BALLN ~~LOC~~ EMERGE MR 3.25X15 (BALLOONS) ×2
BALLOON SAPPHIRE 2.5X12 (BALLOONS) ×1 IMPLANT
BALLOON ~~LOC~~ EMERGE MR 3.25X15 (BALLOONS) ×1 IMPLANT
CATH INFINITI 5 FR 3DRC (CATHETERS) ×2 IMPLANT
CATH INFINITI MULTIPACK ST 5F (CATHETERS) ×2 IMPLANT
CATH VISTA GUIDE 6FR 3DRC (CATHETERS) ×2 IMPLANT
COVER PRB 48X5XTLSCP FOLD TPE (BAG) ×1 IMPLANT
COVER PROBE 5X48 (BAG) ×1
KIT ENCORE 26 ADVANTAGE (KITS) ×2 IMPLANT
KIT HEART LEFT (KITS) ×2 IMPLANT
KIT MICROINTRODUCER STIFF 5F (SHEATH) ×2 IMPLANT
PACK CARDIAC CATHETERIZATION (CUSTOM PROCEDURE TRAY) ×2 IMPLANT
SHEATH PINNACLE 5F 10CM (SHEATH) ×2 IMPLANT
SHEATH PINNACLE 6F 10CM (SHEATH) ×2 IMPLANT
STENT XIENCE ALPINE RX 3.25X28 (Permanent Stent) ×2 IMPLANT
SYR MEDRAD MARK V 150ML (SYRINGE) ×2 IMPLANT
TRANSDUCER W/STOPCOCK (MISCELLANEOUS) ×2 IMPLANT
WIRE EMERALD 3MM-J .035X150CM (WIRE) ×2 IMPLANT
WIRE HI TORQ BMW 190CM (WIRE) ×2 IMPLANT

## 2017-03-04 NOTE — Progress Notes (Signed)
Site area: right groin  Site Prior to Removal:  Level 0  Pressure Applied For 20 MINUTES    Minutes Beginning at 1755  Manual:   Yes.    Patient Status During Pull:  stable  Post Pull Groin Site:  Level 0  Post Pull Instructions Given:  Yes.    Post Pull Pulses Present:  Yes.    Dressing Applied:  Yes.    Comments:  Bedrest began at 80

## 2017-03-04 NOTE — Brief Op Note (Signed)
Brief Cardiac Catheterization Note  Date: 03/04/2017 Time: 2:01 PM  PATIENT:  Lamar L Belair  61 y.o. female  PRE-OPERATIVE DIAGNOSIS: Stable angina  POST-OPERATIVE DIAGNOSIS:  Single vessel coronary artery disease  PROCEDURE:  Procedure(s): Left Heart Cath and Coronary Angiography (N/A) Coronary Stent Intervention (N/A)  SURGEON:  Surgeon(s) and Role:    Nelva Bush, MD - Primary  FINDINGS: 1. Severe single vessel coronary artery disease with 90% mid RCA stenosis. 2. Stable small diagonal branch ostial stenosis. No significant disease otherwise involving the LAD or LCx. 3. Upper normal to mildly elevated left ventricular filling pressure. Normal left ventricular contraction. 4. Successful PCI to mid RCA with placement of a Xience Alpine 3.25 x 28 mm drug-eluting stent.  RECOMMENDATIONS: 1. Admit for overnight observation. 2. Remove right femoral artery sheath 2 hours after discontinuation of bivalirudin. 3. Anticipate restarting apixaban tomorrow if no bleeding. 4. Patient to be on triple therapy with aspirin, clopidogrel, and apixaban for one month. After that time, consider discontinuation of aspirin.  Nelva Bush, MD [facility] Pager: [phone]

## 2017-03-04 NOTE — Care Management Note (Signed)
Case Management Note  Patient Details  Name: [name] MRN: [id number] Date of Birth: 07/23/1956  Subjective/Objective:  From home alone, pta indep,  s/p coronary stent intervention, will be on plavix.  She has PCP and medication coverage.                    Action/Plan: NCM will follow for dc needs.   Expected Discharge Date:                  Expected Discharge Plan:  Home/Self Care  In-House Referral:     Discharge planning Services  CM Consult  Post Acute Care Choice:    Choice offered to:     DME Arranged:    DME Agency:     HH Arranged:    [place] Agency:     Status of Service:  Completed, signed off  If discussed at [employer] of Stay Meetings, dates discussed:    Additional Comments:  Zenon Mayo, RN 03/04/2017, 3:55 PM

## 2017-03-04 NOTE — Interval H&P Note (Signed)
History and Physical Interval Note:  03/04/2017 12:18 PM  Barbara French  has presented today for cardiac catheterization, with the diagnosis of coronary artery disease and angina. The various methods of treatment have been discussed with the patient and family. After consideration of risks, benefits and other options for treatment, the patient has consented to  Procedure(s): Left Heart Cath and Coronary Angiography (N/A) as a surgical intervention .  The patient's history has been reviewed, patient examined, no change in status, stable for surgery.  I have reviewed the patient's chart and labs.  Questions were answered to the patient's satisfaction.    Cath Lab Visit (complete for each Cath Lab visit)  Clinical Evaluation Leading to the Procedure:   ACS: No.  Non-ACS:    Anginal Classification: CCS IV  Anti-ischemic medical therapy: Minimal Therapy (1 class of medications)  Non-Invasive Test Results: No non-invasive testing performed  Prior CABG: No previous CABG  Barbara French

## 2017-03-04 NOTE — Progress Notes (Signed)
Angiomax complete at 1440.
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
## 2017-03-05 ENCOUNTER — Encounter (HOSPITAL_COMMUNITY): Payer: Self-pay | Admitting: *Deleted

## 2017-03-05 DIAGNOSIS — E785 Hyperlipidemia, unspecified: Secondary | ICD-10-CM | POA: Diagnosis not present

## 2017-03-05 DIAGNOSIS — F17200 Nicotine dependence, unspecified, uncomplicated: Secondary | ICD-10-CM

## 2017-03-05 DIAGNOSIS — I209 Angina pectoris, unspecified: Secondary | ICD-10-CM

## 2017-03-05 DIAGNOSIS — I252 Old myocardial infarction: Secondary | ICD-10-CM | POA: Diagnosis not present

## 2017-03-05 DIAGNOSIS — I10 Essential (primary) hypertension: Secondary | ICD-10-CM | POA: Diagnosis not present

## 2017-03-05 DIAGNOSIS — I25119 Atherosclerotic heart disease of native coronary artery with unspecified angina pectoris: Secondary | ICD-10-CM | POA: Diagnosis not present

## 2017-03-05 DIAGNOSIS — I6529 Occlusion and stenosis of unspecified carotid artery: Secondary | ICD-10-CM | POA: Diagnosis not present

## 2017-03-05 DIAGNOSIS — Z955 Presence of coronary angioplasty implant and graft: Secondary | ICD-10-CM

## 2017-03-05 DIAGNOSIS — I2584 Coronary atherosclerosis due to calcified coronary lesion: Secondary | ICD-10-CM | POA: Diagnosis not present

## 2017-03-05 DIAGNOSIS — Z7982 Long term (current) use of aspirin: Secondary | ICD-10-CM | POA: Diagnosis not present

## 2017-03-05 DIAGNOSIS — Z7984 Long term (current) use of oral hypoglycemic drugs: Secondary | ICD-10-CM | POA: Diagnosis not present

## 2017-03-05 DIAGNOSIS — J449 Chronic obstructive pulmonary disease, unspecified: Secondary | ICD-10-CM | POA: Diagnosis not present

## 2017-03-05 DIAGNOSIS — E559 Vitamin D deficiency, unspecified: Secondary | ICD-10-CM | POA: Diagnosis not present

## 2017-03-05 DIAGNOSIS — Z8673 Personal history of transient ischemic attack (TIA), and cerebral infarction without residual deficits: Secondary | ICD-10-CM | POA: Diagnosis not present

## 2017-03-05 DIAGNOSIS — E1140 Type 2 diabetes mellitus with diabetic neuropathy, unspecified: Secondary | ICD-10-CM | POA: Diagnosis not present

## 2017-03-05 DIAGNOSIS — E079 Disorder of thyroid, unspecified: Secondary | ICD-10-CM | POA: Diagnosis not present

## 2017-03-05 DIAGNOSIS — G4730 Sleep apnea, unspecified: Secondary | ICD-10-CM | POA: Diagnosis not present

## 2017-03-05 DIAGNOSIS — Z86711 Personal history of pulmonary embolism: Secondary | ICD-10-CM | POA: Diagnosis not present

## 2017-03-05 DIAGNOSIS — K219 Gastro-esophageal reflux disease without esophagitis: Secondary | ICD-10-CM | POA: Diagnosis not present

## 2017-03-05 DIAGNOSIS — F17210 Nicotine dependence, cigarettes, uncomplicated: Secondary | ICD-10-CM | POA: Diagnosis not present

## 2017-03-05 LAB — BASIC METABOLIC PANEL
ANION GAP: 5 (ref 5–15)
BUN: 20 mg/dL (ref 6–20)
CALCIUM: 8.8 mg/dL — AB (ref 8.9–10.3)
CO2: 25 mmol/L (ref 22–32)
Chloride: 106 mmol/L (ref 101–111)
Creatinine, Ser: 0.93 mg/dL (ref 0.44–1.00)
Glucose, Bld: 129 mg/dL — ABNORMAL HIGH (ref 65–99)
POTASSIUM: 4.2 mmol/L (ref 3.5–5.1)
Sodium: 136 mmol/L (ref 135–145)

## 2017-03-05 LAB — CBC
HEMATOCRIT: 40.3 % (ref 36.0–46.0)
HEMOGLOBIN: 13.2 g/dL (ref 12.0–15.0)
MCH: 29.6 pg (ref 26.0–34.0)
MCHC: 32.8 g/dL (ref 30.0–36.0)
MCV: 90.4 fL (ref 78.0–100.0)
Platelets: 273 10*3/uL (ref 150–400)
RBC: 4.46 MIL/uL (ref 3.87–5.11)
RDW: 15 % (ref 11.5–15.5)
WBC: 7 10*3/uL (ref 4.0–10.5)

## 2017-03-05 LAB — LIPID PANEL
CHOL/HDL RATIO: 3.4 ratio
CHOLESTEROL: 149 mg/dL (ref 0–200)
HDL: 44 mg/dL (ref >40)
LDL Cholesterol: 86 mg/dL (ref 0–99)
TRIGLYCERIDES: 97 mg/dL (ref <150)
VLDL: 19 mg/dL (ref 0–40)

## 2017-03-05 LAB — GLUCOSE, CAPILLARY
Glucose-Capillary: 113 mg/dL — ABNORMAL HIGH (ref 65–99)
Glucose-Capillary: 126 mg/dL — ABNORMAL HIGH (ref 65–99)

## 2017-03-05 MED ORDER — ALUM & MAG HYDROXIDE-SIMETH 200-200-20 MG/5ML PO SUSP
30.0000 mL | Freq: Once | ORAL | Status: AC
  Administered 2017-03-05: 09:00:00 30 mL via ORAL
  Filled 2017-03-05: qty 30

## 2017-03-05 MED ORDER — ALUM & MAG HYDROXIDE-SIMETH 200-200-20 MG/5 ML NICU TOPICAL: 1.0000 "application " | Freq: Once | TOPICAL | Status: DC

## 2017-03-05 MED ORDER — ATORVASTATIN CALCIUM 40 MG PO TABS: 40.0000 mg | ORAL_TABLET | Freq: Every day | ORAL | 1 refills | Status: DC

## 2017-03-05 MED ORDER — CLOPIDOGREL BISULFATE 75 MG PO TABS: 75.0000 mg | ORAL_TABLET | Freq: Every day | ORAL | 2 refills | Status: DC

## 2017-03-05 MED FILL — Bivalirudin Trifluoroacetate For IV Soln 250 MG (Base Equiv): INTRAVENOUS | Qty: 250 | Status: AC

## 2017-03-05 NOTE — Progress Notes (Signed)
CARDIAC REHAB PHASE I   PRE:  Rate/Rhythm: 91 SR    BP: sitting 123/52    SaO2:   MODE:  Ambulation: 550 ft   POST:  Rate/Rhythm: 102 ST    BP: sitting 121/52     SaO2:   Steady, no CP. Had claudication and back pain after 400 ft. Rested in hall but no relief until she sat down. Ed completed including long discussion on smoking cessation and increased ex. She is having success on Chantix and is down to 1/2 ppd more or less from 2 ppd. Encouraged her to be completely done now and gave her fake cigarette. We discussed change of habits and planning for temptation. She understands importance of Plavix. Will refer to [facility].  [id number]   [place], ACSM 03/05/2017 10:02 AM

## 2017-03-05 NOTE — Discharge Summary (Signed)
Discharge Summary    Patient ID: Barbara French,  MRN: [id number], DOB/AGE: 11/23/1955 61 y.o.  Admit date: 03/04/2017 Discharge date: 03/05/2017  Primary Care Provider: Nicholos Johns Primary Cardiologist: Revankar  Discharge Diagnoses    Principal Problem:   Angina, class IV [facility]) Active Problems:   Coronary artery disease involving native coronary artery of native heart with angina pectoris [facility])   Essential hypertension   Current smoker   Diabetes mellitus due to underlying condition with unspecified complications ([place])   History of pulmonary embolism  Allergies Allergies  Allergen Reactions  . Bee Venom Anaphylaxis    Throat closed up  . Cortisone Other (See Comments)    Injection caused temporary paralysis     Diagnostic Studies/Procedures    LHC: 03/04/17  Conclusion   Conclusions: 1. Severe single-vessel coronary artery disease with 90% mid RCA stenosis. 2. Stable appearance of 60% ostial stenosis involving small D1. LAD and LCx otherwise do not have significant disease. 3. Normal left ventricular contraction with upper normal to mildly elevated filling pressure. 4. Successful PCI to the mid RCA with placement of a Xience Alpine 3.25 x 28 mm drug-eluting stent with 0% residual stenosis and TIMI-3 flow.  Recommendations: 1. Overnight extended recovery. 2. Anticipate triple therapy with aspirin, clopidogrel, and apixaban for one month. After that time, discontinuation of aspirin could be considered. Recommend continuing clopidogrel for at least 6-12 months. 3. Aggressive secondary prevention, including addition of statin therapy.  Nelva Bush, MD  _____________   History of Present Illness     61 yo female with PMH of CAD, HTN, NIDDM, HL, tobacco use and PE who was seen in the office by Dr. Geraldo Pitter on 02/23/17 with reported symptoms of exertional chest tightness with radiation in her neck. Symptoms relieved by nitro. Given her risk factors and reported  symptoms she was referred for outpatient cath.   Hospital Course     Underwent LHC with Dr. Saunders Revel noted above with PCI and DES to the RCA. Plan for triple therapy with ASA/plavix and Eliquis given her history of PE for one month, then stop ASA.[name] Kitchen No complications noted post cath. Labs showed stable Cr 0.93, and Hgb 13.2. LDL 86, added Lipitor 40mg  daily this admission. Did have mild episode of chest tightness and burning that was relieved by Maalox. Walked with cardiac rehab without any difficulty.   She was seen by Dr. Ellyn Hack and determined stable for discharge home. Follow up in the office has been arranged. Medications are listed below.   General: Well developed, well nourished, female appearing in no acute distress. Head: Normocephalic, atraumatic.  Neck: Supple without bruits, JVD. Lungs:  Resp regular and unlabored, CTA. Heart: RRR, S1, S2, no S3, S4, or murmur; no rub. Abdomen: Soft, non-tender, non-distended with normoactive bowel sounds. No hepatomegaly. No rebound/guarding. No obvious abdominal masses. Extremities: No clubbing, cyanosis, edema. Distal pedal pulses are 2+ bilaterally. R groin cath site stable without bruising or hematoma Neuro: Alert and oriented X 3. Moves all extremities spontaneously. Psych: Normal affect.  _____________  Discharge Vitals Blood pressure (!) 123/52, pulse 82, temperature (!) 97.4 F (36.3 C), temperature source Oral, resp. rate (!) 21, height 5\' 2"  (1.575 m), weight 173 lb 6.4 oz (78.7 kg), SpO2 95 %.  Filed Weights   03/04/17 1017 03/05/17 0236  Weight: 164 lb (74.4 kg) 173 lb 6.4 oz (78.7 kg)    Labs & Radiologic Studies    CBC  Recent Labs  03/05/17 0351  WBC 7.0  HGB 13.2  HCT 40.3  MCV 90.4  PLT 235   Basic Metabolic Panel  Recent Labs  03/05/17 0351  NA 136  K 4.2  CL 106  CO2 25  GLUCOSE 129*  BUN 20  CREATININE 0.93  CALCIUM 8.8*   Liver Function Tests No results for input(s): AST, ALT, ALKPHOS, BILITOT, PROT,  ALBUMIN in the last 72 hours. No results for input(s): LIPASE, AMYLASE in the last 72 hours. Cardiac Enzymes No results for input(s): CKTOTAL, CKMB, CKMBINDEX, TROPONINI in the last 72 hours. BNP Invalid input(s): POCBNP D-Dimer No results for input(s): DDIMER in the last 72 hours. Hemoglobin A1C No results for input(s): HGBA1C in the last 72 hours. Fasting Lipid Panel  Recent Labs  03/05/17 0351  CHOL 149  HDL 44  LDLCALC 86  TRIG 97  CHOLHDL 3.4   Thyroid Function Tests No results for input(s): TSH, T4TOTAL, T3FREE, THYROIDAB in the last 72 hours.  Invalid input(s): FREET3 _____________  Dg Chest 2 View  Result Date: 02/23/2017 CLINICAL DATA:  Cardiac disease. Recent vocal cord tumor. Evaluation prior to cardiac catheterization. EXAM: CHEST  2 VIEW COMPARISON:  01/27/2016 FINDINGS: Heart size is normal. There is aortic atherosclerosis. There is pulmonary scarring bilaterally. Surgical changes are seen in the left lower lung. No sign of active infiltrate, mass, effusion or collapse. No acute bone finding. IMPRESSION: No active disease. Aortic atherosclerosis. Chronic pulmonary scarring. Previous surgical changes in the left lung. Electronically Signed   By: Nelson Chimes M.D.   On: 02/23/2017 19:57   Disposition   Pt is being discharged home today in good condition.  Follow-up Plans & Appointments    Follow-up Information    Revankar, Reita Cliche, MD Follow up on 03/23/2017.   Specialty:  Cardiology Why:  at 2:40pm for your follow up appt.  Contact information: [place] [address] [phone]          Discharge Instructions    Call MD for:  redness, tenderness, or signs of infection (pain, swelling, redness, odor or green/yellow discharge around incision site)    Complete by:  As directed    Diet - low sodium heart healthy    Complete by:  As directed    Discharge instructions    Complete by:  As directed    Groin Site Care Refer  to this sheet in the next few weeks. These instructions provide you with information on caring for yourself after your procedure. Your caregiver may also give you more specific instructions. Your treatment has been planned according to current medical practices, but problems sometimes occur. Call your caregiver if you have any problems or questions after your procedure. HOME CARE INSTRUCTIONS You may shower 24 hours after the procedure. Remove the bandage (dressing) and gently wash the site with plain soap and water. Gently pat the site dry.  Do not apply powder or lotion to the site.  Do not sit in a bathtub, swimming pool, or whirlpool for 5 to 7 days.  No bending, squatting, or lifting anything over 10 pounds (4.5 kg) as directed by your caregiver.  Inspect the site at least twice daily.  Do not drive home if you are discharged the same day of the procedure. Have someone else drive you.  You may drive 24 hours after the procedure unless otherwise instructed by your caregiver.  What to expect: Any bruising will usually fade within 1 to 2 weeks.  Blood  that collects in the tissue (hematoma) may be painful to the touch. It should usually decrease in size and tenderness within 1 to 2 weeks.  SEEK IMMEDIATE MEDICAL CARE IF: You have unusual pain at the groin site or down the affected leg.  You have redness, warmth, swelling, or pain at the groin site.  You have drainage (other than a small amount of blood on the dressing).  You have chills.  You have a fever or persistent symptoms for more than 72 hours.  You have a fever and your symptoms suddenly get worse.  Your leg becomes pale, cool, tingly, or numb.  You have heavy bleeding from the site. Hold pressure on the site. [name] Kitchen  PLEASE DO NOT MISS ANY DOSES OF YOUR PLAVIX!!! You will be on aspirin, plavix and eliquis for one month, then plan to stop aspirin. Continue Plavix, and eliquis. Please monitor for signs/symptoms of bleeding with these  medications. Call with any questions.   Increase activity slowly    Complete by:  As directed       Discharge Medications   Current Discharge Medication List    START taking these medications   Details  atorvastatin (LIPITOR) 40 MG tablet Take 1 tablet (40 mg total) by mouth daily at 6 PM. Qty: 90 tablet, Refills: 1    clopidogrel (PLAVIX) 75 MG tablet Take 1 tablet (75 mg total) by mouth daily with breakfast. Qty: 90 tablet, Refills: 2      CONTINUE these medications which have NOT CHANGED   Details  acetaminophen (TYLENOL) 500 MG tablet Take 1,000 mg by mouth 2 (two) times daily as needed for moderate pain or headache.     acyclovir ointment (ZOVIRAX) 5 % Apply 1 application topically daily as needed (ear infections).    amphetamine-dextroamphetamine (ADDERALL) 15 MG tablet Take 15 mg by mouth 2 (two) times daily.    apixaban (ELIQUIS) 5 MG TABS tablet Take 5 mg by mouth 2 (two) times daily.    aspirin 81 MG tablet Take 81 mg by mouth every evening.     Biotin 5000 MCG TABS Take 30 mg by mouth at bedtime.    budesonide-formoterol (SYMBICORT) 160-4.5 MCG/ACT inhaler Inhale 2 puffs into the lungs daily as needed (shortness of breath).     bumetanide (BUMEX) 0.5 MG tablet Take 0.5 mg by mouth daily as needed (swelling).    buPROPion (WELLBUTRIN XL) 150 MG 24 hr tablet Take 150 mg by mouth every evening.     CHANTIX 1 MG tablet Take 1 mg by mouth daily.    cyclobenzaprine (FLEXERIL) 10 MG tablet Take 10 mg by mouth 3 (three) times daily.     diltiazem (CARDIZEM CD) 120 MG 24 hr capsule Take 120 mg by mouth at bedtime.     docusate sodium (COLACE) 50 MG capsule Take 50 mg by mouth at bedtime.    DULoxetine (CYMBALTA) 60 MG capsule Take 60 mg by mouth every evening.     fenofibrate 160 MG tablet Take 160 mg by mouth every evening.     fluticasone (FLONASE) 50 MCG/ACT nasal spray Place 1 spray into both nostrils daily as needed for allergies or rhinitis.      HYDROmorphone (DILAUDID) 4 MG tablet Take 4 mg by mouth 3 (three) times daily.  Refills: 0    losartan (COZAAR) 50 MG tablet Take 75 mg by mouth at bedtime.     meclizine (ANTIVERT) 12.5 MG tablet Take 12.5 mg by mouth 2 (two) times daily as  needed for dizziness.     Melatonin 10 MG TABS Take 10 mg by mouth at bedtime.    metFORMIN (GLUCOPHAGE) 500 MG tablet Take 750 mg by mouth 2 (two) times daily.     montelukast (SINGULAIR) 10 MG tablet Take 10 mg by mouth at bedtime.    nitroGLYCERIN (NITROSTAT) 0.4 MG SL tablet Place 0.4 mg under the tongue every 5 (five) minutes as needed for chest pain.     pantoprazole (PROTONIX) 40 MG tablet Take 40 mg by mouth at bedtime.     rOPINIRole (REQUIP) 0.5 MG tablet Take 0.5 mg by mouth at bedtime. May take an additional 0.5mg  daily as needed for restless legs    tamsulosin (FLOMAX) 0.4 MG CAPS capsule Take 0.4 mg by mouth at bedtime.     Vitamin D, Ergocalciferol, (DRISDOL) 50000 units CAPS capsule Take 50,000 Units by mouth every Friday.       STOP taking these medications     cilostazol (PLETAL) 50 MG tablet      ranitidine (ZANTAC) 150 MG tablet          Aspirin prescribed at discharge?  Yes High Intensity Statin Prescribed? (Lipitor 40-80mg  or Crestor 20-40mg ): Yes Beta Blocker Prescribed? No: on CCB For EF <40%, was ACEI/ARB Prescribed? No: yes ADP Receptor Inhibitor Prescribed? (i.e. Plavix etc.-Includes Medically Managed Patients): Yes For EF <40%, Aldosterone Inhibitor Prescribed? No: EF ok Was EF assessed during THIS hospitalization? Yes Was Cardiac Rehab II ordered? (Included Medically managed Patients): Yes   Outstanding Labs/Studies   FLP/LFTs in 6 weeks if tolerating statin  Duration of Discharge Encounter   Greater than 30 minutes including physician time.  Signed, Reino Bellis NP-C 03/05/2017, 9:58 AM

## 2017-03-11 ENCOUNTER — Telehealth: Payer: Self-pay

## 2017-03-11 NOTE — Telephone Encounter (Signed)
S/w pt at length. She states that her right leg from the knee down is swelling and has some redness. She denies blue discoloration at this time. She states it becomes painful when she bends her foot "too much." I have arranged for pt to come to the office tomorrow for evaluation. She requested 11:00. I have encouraged her to elevate her leg and the use of hot/cold compresses. Pt also instructed to f.u with ED should the leg become bluish in color or cold to the touch. She notes the leg is warm and dry at this time.

## 2017-03-12 ENCOUNTER — Encounter: Payer: Self-pay | Admitting: Cardiology

## 2017-03-12 ENCOUNTER — Ambulatory Visit: Payer: Medicare Other | Admitting: Cardiology

## 2017-03-12 ENCOUNTER — Ambulatory Visit (INDEPENDENT_AMBULATORY_CARE_PROVIDER_SITE_OTHER): Payer: Medicare Other | Admitting: Cardiology

## 2017-03-12 VITALS — BP 124/58 | HR 82 | Ht 62.0 in | Wt 172.1 lb

## 2017-03-12 DIAGNOSIS — E088 Diabetes mellitus due to underlying condition with unspecified complications: Secondary | ICD-10-CM | POA: Diagnosis not present

## 2017-03-12 DIAGNOSIS — F17200 Nicotine dependence, unspecified, uncomplicated: Secondary | ICD-10-CM | POA: Diagnosis not present

## 2017-03-12 DIAGNOSIS — E785 Hyperlipidemia, unspecified: Secondary | ICD-10-CM

## 2017-03-12 DIAGNOSIS — M7989 Other specified soft tissue disorders: Secondary | ICD-10-CM

## 2017-03-12 DIAGNOSIS — I25119 Atherosclerotic heart disease of native coronary artery with unspecified angina pectoris: Secondary | ICD-10-CM

## 2017-03-12 DIAGNOSIS — I10 Essential (primary) hypertension: Secondary | ICD-10-CM | POA: Diagnosis not present

## 2017-03-12 MED ORDER — BUMETANIDE 0.5 MG PO TABS: 0.5000 mg | ORAL_TABLET | Freq: Every day | ORAL | 11 refills | Status: DC | PRN

## 2017-03-12 NOTE — Patient Instructions (Signed)
Medication Instructions:  Your physician recommends that you continue on your current medications as directed. Please refer to the Current Medication list given to you today.   Labwork: None  Testing/Procedures: Your physician has requested that you have a lower extremity venous duplex. This test is an ultrasound of the veins in the legs. It looks at venous blood flow that carries blood from the heart to the legs. Allow one hour for a Lower Venous exam. There are no restrictions or special instructions.    Follow-Up: Your physician wants you to follow-up in: 3 months. You will receive a reminder letter in the mail two months in advance. If you don't receive a letter, please call our office to schedule the follow-up appointment.   Any Other Special Instructions Will Be Listed Below (If Applicable).     If you need a refill on your cardiac medications before your next appointment, please call your pharmacy.
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
## 2017-03-16 ENCOUNTER — Other Ambulatory Visit: Payer: Self-pay | Admitting: Cardiology

## 2017-03-19 ENCOUNTER — Telehealth: Payer: Self-pay

## 2017-03-19 NOTE — Telephone Encounter (Signed)
Error

## 2017-03-19 NOTE — Telephone Encounter (Deleted)
Informed pt of her normal DVT study of the lower Extremities. Pt also states that she is still having some swelling in her ankles and feet. I told her that I would inform Dr. Geraldo Pitter to see if there is any other recommendations. Pt verbalized understanding.

## 2017-03-19 NOTE — Telephone Encounter (Signed)
Informed pt of her normal DVT study of the lower Extremities. Pt also states that she is still having some swelling in her ankles and feet. I told her that I would inform Dr. Geraldo Pitter to see if there is any other recommendations. Pt verbalized understanding.

## 2017-03-19 NOTE — Telephone Encounter (Signed)
Also I offered to schedule her an appt with her PCP since having the swelling still and Dr. Geraldo Pitter is currently out of the office and pt insisted that she would call her self to make the appointment to see Dr. Rica Records.

## 2017-03-23 ENCOUNTER — Ambulatory Visit: Payer: Medicare Other | Admitting: Cardiology

## 2017-03-25 DIAGNOSIS — M545 Low back pain: Secondary | ICD-10-CM | POA: Diagnosis not present

## 2017-03-25 DIAGNOSIS — M542 Cervicalgia: Secondary | ICD-10-CM | POA: Diagnosis not present

## 2017-03-25 DIAGNOSIS — G894 Chronic pain syndrome: Secondary | ICD-10-CM | POA: Diagnosis not present

## 2017-03-29 DIAGNOSIS — G4711 Idiopathic hypersomnia with long sleep time: Secondary | ICD-10-CM | POA: Diagnosis not present

## 2017-03-29 DIAGNOSIS — Z79899 Other long term (current) drug therapy: Secondary | ICD-10-CM | POA: Diagnosis not present

## 2017-04-05 DIAGNOSIS — J449 Chronic obstructive pulmonary disease, unspecified: Secondary | ICD-10-CM | POA: Diagnosis not present

## 2017-04-26 DIAGNOSIS — G894 Chronic pain syndrome: Secondary | ICD-10-CM | POA: Diagnosis not present

## 2017-04-26 DIAGNOSIS — M5106 Intervertebral disc disorders with myelopathy, lumbar region: Secondary | ICD-10-CM | POA: Diagnosis not present

## 2017-04-26 DIAGNOSIS — M545 Low back pain: Secondary | ICD-10-CM | POA: Diagnosis not present

## 2017-04-26 DIAGNOSIS — M542 Cervicalgia: Secondary | ICD-10-CM | POA: Diagnosis not present

## 2017-05-06 DIAGNOSIS — J449 Chronic obstructive pulmonary disease, unspecified: Secondary | ICD-10-CM | POA: Diagnosis not present

## 2017-05-12 DIAGNOSIS — M545 Low back pain: Secondary | ICD-10-CM | POA: Diagnosis not present

## 2017-05-12 DIAGNOSIS — R109 Unspecified abdominal pain: Secondary | ICD-10-CM | POA: Diagnosis not present

## 2017-05-12 DIAGNOSIS — R3129 Other microscopic hematuria: Secondary | ICD-10-CM | POA: Diagnosis not present

## 2017-05-13 DIAGNOSIS — R319 Hematuria, unspecified: Secondary | ICD-10-CM | POA: Diagnosis not present

## 2017-05-13 DIAGNOSIS — R109 Unspecified abdominal pain: Secondary | ICD-10-CM | POA: Diagnosis not present

## 2017-05-24 DIAGNOSIS — M542 Cervicalgia: Secondary | ICD-10-CM | POA: Diagnosis not present

## 2017-05-24 DIAGNOSIS — M4712 Other spondylosis with myelopathy, cervical region: Secondary | ICD-10-CM | POA: Diagnosis not present

## 2017-05-24 DIAGNOSIS — M5106 Intervertebral disc disorders with myelopathy, lumbar region: Secondary | ICD-10-CM | POA: Diagnosis not present

## 2017-05-24 DIAGNOSIS — G894 Chronic pain syndrome: Secondary | ICD-10-CM | POA: Diagnosis not present

## 2017-05-24 DIAGNOSIS — M545 Low back pain: Secondary | ICD-10-CM | POA: Diagnosis not present

## 2017-06-05 DIAGNOSIS — J449 Chronic obstructive pulmonary disease, unspecified: Secondary | ICD-10-CM | POA: Diagnosis not present

## 2017-06-08 DIAGNOSIS — M545 Low back pain: Secondary | ICD-10-CM | POA: Diagnosis not present

## 2017-06-08 DIAGNOSIS — M5106 Intervertebral disc disorders with myelopathy, lumbar region: Secondary | ICD-10-CM | POA: Diagnosis not present

## 2017-06-17 DIAGNOSIS — M545 Low back pain: Secondary | ICD-10-CM | POA: Diagnosis not present

## 2017-06-17 DIAGNOSIS — M961 Postlaminectomy syndrome, not elsewhere classified: Secondary | ICD-10-CM | POA: Diagnosis not present

## 2017-06-17 DIAGNOSIS — G894 Chronic pain syndrome: Secondary | ICD-10-CM | POA: Diagnosis not present

## 2017-06-17 DIAGNOSIS — M542 Cervicalgia: Secondary | ICD-10-CM | POA: Diagnosis not present

## 2017-06-17 DIAGNOSIS — M4716 Other spondylosis with myelopathy, lumbar region: Secondary | ICD-10-CM | POA: Diagnosis not present

## 2017-06-23 ENCOUNTER — Ambulatory Visit: Payer: Medicare Other | Admitting: Cardiology

## 2017-06-28 DIAGNOSIS — Z01818 Encounter for other preprocedural examination: Secondary | ICD-10-CM | POA: Diagnosis not present

## 2017-06-28 DIAGNOSIS — Z23 Encounter for immunization: Secondary | ICD-10-CM | POA: Diagnosis not present

## 2017-06-28 DIAGNOSIS — R490 Dysphonia: Secondary | ICD-10-CM | POA: Diagnosis not present

## 2017-06-28 DIAGNOSIS — I10 Essential (primary) hypertension: Secondary | ICD-10-CM | POA: Diagnosis not present

## 2017-06-29 ENCOUNTER — Telehealth: Payer: Self-pay

## 2017-06-29 ENCOUNTER — Ambulatory Visit (HOSPITAL_BASED_OUTPATIENT_CLINIC_OR_DEPARTMENT_OTHER)
Admission: RE | Admit: 2017-06-29 | Discharge: 2017-06-29 | Disposition: A | Discharge status: Home / Self Care | Payer: Medicare Other | Source: Ambulatory Visit | Attending: Cardiology | Admitting: Cardiology

## 2017-06-29 ENCOUNTER — Encounter: Payer: Self-pay | Admitting: Cardiology

## 2017-06-29 ENCOUNTER — Ambulatory Visit (INDEPENDENT_AMBULATORY_CARE_PROVIDER_SITE_OTHER): Payer: Medicare Other | Admitting: Cardiology

## 2017-06-29 VITALS — BP 122/60 | HR 93 | Ht 62.0 in | Wt 182.0 lb

## 2017-06-29 DIAGNOSIS — I10 Essential (primary) hypertension: Secondary | ICD-10-CM | POA: Diagnosis not present

## 2017-06-29 DIAGNOSIS — I25119 Atherosclerotic heart disease of native coronary artery with unspecified angina pectoris: Secondary | ICD-10-CM | POA: Diagnosis not present

## 2017-06-29 DIAGNOSIS — I2510 Atherosclerotic heart disease of native coronary artery without angina pectoris: Secondary | ICD-10-CM | POA: Diagnosis not present

## 2017-06-29 DIAGNOSIS — Z86711 Personal history of pulmonary embolism: Secondary | ICD-10-CM

## 2017-06-29 DIAGNOSIS — F17210 Nicotine dependence, cigarettes, uncomplicated: Secondary | ICD-10-CM

## 2017-06-29 DIAGNOSIS — E088 Diabetes mellitus due to underlying condition with unspecified complications: Secondary | ICD-10-CM | POA: Diagnosis not present

## 2017-06-29 DIAGNOSIS — E119 Type 2 diabetes mellitus without complications: Secondary | ICD-10-CM | POA: Diagnosis not present

## 2017-06-29 DIAGNOSIS — F17200 Nicotine dependence, unspecified, uncomplicated: Secondary | ICD-10-CM

## 2017-06-29 DIAGNOSIS — E785 Hyperlipidemia, unspecified: Secondary | ICD-10-CM

## 2017-06-29 DIAGNOSIS — Z9889 Other specified postprocedural states: Secondary | ICD-10-CM

## 2017-06-29 DIAGNOSIS — R0602 Shortness of breath: Secondary | ICD-10-CM | POA: Diagnosis not present

## 2017-06-29 DIAGNOSIS — R079 Chest pain, unspecified: Secondary | ICD-10-CM

## 2017-06-29 DIAGNOSIS — Z01818 Encounter for other preprocedural examination: Secondary | ICD-10-CM | POA: Diagnosis not present

## 2017-06-29 LAB — PROTIME-INR

## 2017-06-29 NOTE — Telephone Encounter (Signed)
Left detailed message per DPR:  Patient contacted pre-catheterization at [facility] scheduled for:  06/25/2017 @ 0730 Verified arrival time and place:  [facility] @ 0530 Confirmed AM meds to be taken pre-cath with sip of water: Take ASA/Plavix Hold Metformin for 24 hours-last dose Saturday evening Hold Eliquis-last dose Saturday AM then hold  Addl concerns:  Dr. Geraldo Pitter had advised pt to hold Eliquis one day.  Call placed to Dr. Geraldo Pitter to see if he had cleared with interventionalist.  Dr. Geraldo Pitter notified of normal 48 hour hold time.  Per Dr. Geraldo Pitter, ok to hold but would like last dose to be Saturday AM prior to cath.  That puts hold time at exactly 48 hours.  Notified Pt.
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
## 2017-06-29 NOTE — Patient Instructions (Addendum)
Medication Instructions:   Your physician recommends that you continue on your current medications as directed. Please refer to the Current Medication list given to you today.   Labwork:  Your physician recommends that you return for lab work in: Today at Dr. Lauralee Evener office ask to have CBC, BMP, PT/INR    Testing/Procedures: A chest x-ray takes a picture of the organs and structures inside the chest, including the heart, lungs, and blood vessels. This test can show several things, including, whether the heart is enlarges; whether fluid is building up in the lungs; and whether pacemaker / defibrillator leads are still in place.    [facility] [address] [place] [address] Dept: [phone] Loc: [phone]  Barbara French  06/29/2017  You are scheduled for a Left Heart Cath on Monday, November 26th  with Dr. Burt Knack   1. Please arrive at the [facility] (Main Entrance A) at [facility]: [address] [place], [place] [address] at 5:30 am  (two hours before your procedure to ensure your preparation). Free valet parking service is available.   Special note: Every effort is made to have your procedure done on time. Please understand that emergencies sometimes delay scheduled procedures.  2. Diet: Nothing to eat or drink after midnight   3. Labs: will be done today at PCP office.   4. Medication instructions in preparation for your procedure:   STOP Eliquis 24 hours before your cath ( Hold your night and morning dose)   STOP Metformin 24 hours before and done take 48 hours after heart cath.     On the morning of your procedure, take your Asprin and any morning medicines NOT listed above.  You may use sips of water.  5. Plan for one night stay--bring personal belongings. 6. Bring a current list of your medications and current insurance cards. 7. You MUST have a  responsible person to drive you home. 8. Someone MUST be with you the first 24 hours after you arrive home or your discharge will be delayed. 9. Please wear clothes that are easy to get on and off and wear slip-on shoes.  Thank you for allowing [place] to care for you!   -- [facility]   Follow-Up: Follow up in 2 weeks after your Cath with Dr. Geraldo Pitter.   Any Other Special Instructions Will Be Listed Below (If Applicable).     If you need a refill on your cardiac medications before your next appointment, please call your pharmacy.
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
## 2017-07-05 ENCOUNTER — Encounter (HOSPITAL_COMMUNITY)
Admission: RE | Disposition: A | Discharge status: Home / Self Care | Payer: Self-pay | Source: Ambulatory Visit | Attending: Cardiovascular Disease

## 2017-07-05 ENCOUNTER — Encounter (HOSPITAL_COMMUNITY): Payer: Self-pay | Admitting: Cardiovascular Disease

## 2017-07-05 ENCOUNTER — Ambulatory Visit (HOSPITAL_COMMUNITY)
Admission: RE | Admit: 2017-07-05 | Discharge: 2017-07-05 | Disposition: A | Discharge status: Home / Self Care | Payer: Medicare Other | Source: Ambulatory Visit | Attending: Cardiovascular Disease | Admitting: Cardiovascular Disease

## 2017-07-05 DIAGNOSIS — Z7983 Long term (current) use of bisphosphonates: Secondary | ICD-10-CM | POA: Diagnosis not present

## 2017-07-05 DIAGNOSIS — E785 Hyperlipidemia, unspecified: Secondary | ICD-10-CM | POA: Diagnosis not present

## 2017-07-05 DIAGNOSIS — Z79899 Other long term (current) drug therapy: Secondary | ICD-10-CM | POA: Insufficient documentation

## 2017-07-05 DIAGNOSIS — Z96651 Presence of right artificial knee joint: Secondary | ICD-10-CM | POA: Insufficient documentation

## 2017-07-05 DIAGNOSIS — E1151 Type 2 diabetes mellitus with diabetic peripheral angiopathy without gangrene: Secondary | ICD-10-CM | POA: Insufficient documentation

## 2017-07-05 DIAGNOSIS — F17210 Nicotine dependence, cigarettes, uncomplicated: Secondary | ICD-10-CM | POA: Diagnosis not present

## 2017-07-05 DIAGNOSIS — Z86711 Personal history of pulmonary embolism: Secondary | ICD-10-CM | POA: Diagnosis not present

## 2017-07-05 DIAGNOSIS — Z7901 Long term (current) use of anticoagulants: Secondary | ICD-10-CM | POA: Diagnosis not present

## 2017-07-05 DIAGNOSIS — I25119 Atherosclerotic heart disease of native coronary artery with unspecified angina pectoris: Secondary | ICD-10-CM | POA: Insufficient documentation

## 2017-07-05 DIAGNOSIS — R079 Chest pain, unspecified: Secondary | ICD-10-CM | POA: Diagnosis not present

## 2017-07-05 DIAGNOSIS — J449 Chronic obstructive pulmonary disease, unspecified: Secondary | ICD-10-CM | POA: Diagnosis not present

## 2017-07-05 DIAGNOSIS — Z955 Presence of coronary angioplasty implant and graft: Secondary | ICD-10-CM | POA: Diagnosis not present

## 2017-07-05 DIAGNOSIS — I252 Old myocardial infarction: Secondary | ICD-10-CM | POA: Diagnosis not present

## 2017-07-05 DIAGNOSIS — Z7902 Long term (current) use of antithrombotics/antiplatelets: Secondary | ICD-10-CM | POA: Diagnosis not present

## 2017-07-05 DIAGNOSIS — Z7982 Long term (current) use of aspirin: Secondary | ICD-10-CM | POA: Diagnosis not present

## 2017-07-05 DIAGNOSIS — Z8673 Personal history of transient ischemic attack (TIA), and cerebral infarction without residual deficits: Secondary | ICD-10-CM | POA: Insufficient documentation

## 2017-07-05 DIAGNOSIS — I10 Essential (primary) hypertension: Secondary | ICD-10-CM | POA: Insufficient documentation

## 2017-07-05 HISTORY — PX: LEFT HEART CATH AND CORONARY ANGIOGRAPHY: CATH118249

## 2017-07-05 LAB — CBC
HCT: 40.4 % (ref 36.0–46.0)
Hemoglobin: 13.3 g/dL (ref 12.0–15.0)
MCH: 30.1 pg (ref 26.0–34.0)
MCHC: 32.9 g/dL (ref 30.0–36.0)
MCV: 91.4 fL (ref 78.0–100.0)
PLATELETS: 276 10*3/uL (ref 150–400)
RBC: 4.42 MIL/uL (ref 3.87–5.11)
RDW: 14 % (ref 11.5–15.5)
WBC: 8.3 10*3/uL (ref 4.0–10.5)

## 2017-07-05 LAB — BASIC METABOLIC PANEL
Anion gap: 6 (ref 5–15)
BUN: 9 mg/dL (ref 6–20)
CALCIUM: 9 mg/dL (ref 8.9–10.3)
CO2: 25 mmol/L (ref 22–32)
CREATININE: 0.7 mg/dL (ref 0.44–1.00)
Chloride: 107 mmol/L (ref 101–111)
GFR calc Af Amer: >60 mL/min (ref >60)
GLUCOSE: 119 mg/dL — AB (ref 65–99)
POTASSIUM: 4.4 mmol/L (ref 3.5–5.1)
SODIUM: 138 mmol/L (ref 135–145)

## 2017-07-05 LAB — PROTIME-INR
INR: 0.87
PROTHROMBIN TIME: 11.8 s (ref 11.4–15.2)

## 2017-07-05 LAB — GLUCOSE, CAPILLARY
Glucose-Capillary: 122 mg/dL — ABNORMAL HIGH (ref 65–99)
Glucose-Capillary: 95 mg/dL (ref 65–99)

## 2017-07-05 SURGERY — LEFT HEART CATH AND CORONARY ANGIOGRAPHY
Anesthesia: LOCAL

## 2017-07-05 MED ORDER — MIDAZOLAM HCL 2 MG/2ML IJ SOLN
INTRAMUSCULAR | Status: AC
  Filled 2017-07-05: qty 2

## 2017-07-05 MED ORDER — SODIUM CHLORIDE 0.9% FLUSH: 3.0000 mL | INTRAVENOUS | Status: DC | PRN

## 2017-07-05 MED ORDER — FENTANYL CITRATE (PF) 100 MCG/2ML IJ SOLN
INTRAMUSCULAR | Status: AC
  Filled 2017-07-05: qty 2

## 2017-07-05 MED ORDER — HEPARIN (PORCINE) IN NACL 2-0.9 UNIT/ML-% IJ SOLN
INTRAMUSCULAR | Status: AC
  Filled 2017-07-05: qty 1000

## 2017-07-05 MED ORDER — SODIUM CHLORIDE 0.9 % WEIGHT BASED INFUSION: 1.0000 mL/kg/h | INTRAVENOUS | Status: DC

## 2017-07-05 MED ORDER — LIDOCAINE HCL (PF) 1 % IJ SOLN
INTRAMUSCULAR | Status: AC
  Filled 2017-07-05: qty 30

## 2017-07-05 MED ORDER — HEPARIN (PORCINE) IN NACL 2-0.9 UNIT/ML-% IJ SOLN
INTRAMUSCULAR | Status: AC | PRN
  Administered 2017-07-05: 1000 mL

## 2017-07-05 MED ORDER — MIDAZOLAM HCL 2 MG/2ML IJ SOLN
INTRAMUSCULAR | Status: DC | PRN
  Administered 2017-07-05: 2 mg via INTRAVENOUS

## 2017-07-05 MED ORDER — ONDANSETRON HCL 4 MG/2ML IJ SOLN: 4.0000 mg | Freq: Four times a day (QID) | INTRAMUSCULAR | Status: DC | PRN

## 2017-07-05 MED ORDER — ACETAMINOPHEN 325 MG PO TABS: 650.0000 mg | ORAL_TABLET | ORAL | Status: DC | PRN

## 2017-07-05 MED ORDER — SODIUM CHLORIDE 0.9 % IV SOLN: 250.0000 mL | INTRAVENOUS | Status: DC | PRN

## 2017-07-05 MED ORDER — LIDOCAINE HCL (PF) 1 % IJ SOLN
INTRAMUSCULAR | Status: DC | PRN
  Administered 2017-07-05: 15 mL

## 2017-07-05 MED ORDER — SODIUM CHLORIDE 0.9% FLUSH: 3.0000 mL | Freq: Two times a day (BID) | INTRAVENOUS | Status: DC

## 2017-07-05 MED ORDER — IOPAMIDOL (ISOVUE-370) INJECTION 76%
INTRAVENOUS | Status: AC
  Filled 2017-07-05: qty 100

## 2017-07-05 MED ORDER — SODIUM CHLORIDE 0.9 % WEIGHT BASED INFUSION
3.0000 mL/kg/h | INTRAVENOUS | Status: AC
  Administered 2017-07-05: 3 mL/kg/h via INTRAVENOUS

## 2017-07-05 MED ORDER — FENTANYL CITRATE (PF) 100 MCG/2ML IJ SOLN
INTRAMUSCULAR | Status: DC | PRN
  Administered 2017-07-05: 25 ug via INTRAVENOUS

## 2017-07-05 MED ORDER — IOPAMIDOL (ISOVUE-370) INJECTION 76%
INTRAVENOUS | Status: DC | PRN
  Administered 2017-07-05: 60 mL via INTRA_ARTERIAL

## 2017-07-05 MED ORDER — ASPIRIN 81 MG PO CHEW: 81.0000 mg | CHEWABLE_TABLET | ORAL | Status: DC

## 2017-07-05 SURGICAL SUPPLY — 11 items
CATH INFINITI 5FR MULTPACK ANG (CATHETERS) ×2 IMPLANT
COVER PRB 48X5XTLSCP FOLD TPE (BAG) ×1 IMPLANT
COVER PROBE 5X48 (BAG) ×1
KIT HEART LEFT (KITS) ×2 IMPLANT
KIT MICROINTRODUCER STIFF 5F (SHEATH) ×2 IMPLANT
PACK CARDIAC CATHETERIZATION (CUSTOM PROCEDURE TRAY) ×2 IMPLANT
SHEATH PINNACLE 5F 10CM (SHEATH) ×2 IMPLANT
SYR MEDRAD MARK V 150ML (SYRINGE) ×2 IMPLANT
TRANSDUCER W/STOPCOCK (MISCELLANEOUS) ×2 IMPLANT
TUBING CIL FLEX 10 FLL-RA (TUBING) IMPLANT
WIRE EMERALD 3MM-J .035X150CM (WIRE) ×2 IMPLANT
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
## 2017-07-05 NOTE — Discharge Instructions (Signed)

## 2017-07-05 NOTE — Interval H&P Note (Signed)
Cath Lab Visit (complete for each Cath Lab visit)  Clinical Evaluation Leading to the Procedure:   ACS: No.  Non-ACS:    Anginal Classification: CCS III  Anti-ischemic medical therapy: Minimal Therapy (1 class of medications)  Non-Invasive Test Results: No non-invasive testing performed  Prior CABG: No previous CABG      History and Physical Interval Note:  07/05/2017 7:28 AM  Dottie L Latif  has presented today for surgery, with the diagnosis of cp  The various methods of treatment have been discussed with the patient and family. After consideration of risks, benefits and other options for treatment, the patient has consented to  Procedure(s): LEFT HEART CATH AND CORONARY ANGIOGRAPHY (N/A) as a surgical intervention .  The patient's history has been reviewed, patient examined, no change in status, stable for surgery.  I have reviewed the patient's chart and labs.  Questions were answered to the patient's satisfaction.     [facility]

## 2017-07-06 DIAGNOSIS — J449 Chronic obstructive pulmonary disease, unspecified: Secondary | ICD-10-CM | POA: Diagnosis not present

## 2017-07-12 DIAGNOSIS — J370 Chronic laryngitis: Secondary | ICD-10-CM | POA: Diagnosis not present

## 2017-07-13 ENCOUNTER — Ambulatory Visit: Payer: Medicare Other | Admitting: Cardiology

## 2017-07-15 ENCOUNTER — Encounter: Payer: Self-pay | Admitting: Cardiology

## 2017-07-15 ENCOUNTER — Ambulatory Visit (INDEPENDENT_AMBULATORY_CARE_PROVIDER_SITE_OTHER): Payer: Medicare Other | Admitting: Cardiology

## 2017-07-15 VITALS — BP 138/64 | HR 93 | Ht 62.0 in | Wt 184.0 lb

## 2017-07-15 DIAGNOSIS — I70213 Atherosclerosis of native arteries of extremities with intermittent claudication, bilateral legs: Secondary | ICD-10-CM

## 2017-07-15 DIAGNOSIS — I10 Essential (primary) hypertension: Secondary | ICD-10-CM

## 2017-07-15 DIAGNOSIS — Z9889 Other specified postprocedural states: Secondary | ICD-10-CM | POA: Diagnosis not present

## 2017-07-15 DIAGNOSIS — F17200 Nicotine dependence, unspecified, uncomplicated: Secondary | ICD-10-CM

## 2017-07-15 DIAGNOSIS — E088 Diabetes mellitus due to underlying condition with unspecified complications: Secondary | ICD-10-CM

## 2017-07-15 DIAGNOSIS — G894 Chronic pain syndrome: Secondary | ICD-10-CM | POA: Diagnosis not present

## 2017-07-15 DIAGNOSIS — I25119 Atherosclerotic heart disease of native coronary artery with unspecified angina pectoris: Secondary | ICD-10-CM

## 2017-07-15 DIAGNOSIS — Z79891 Long term (current) use of opiate analgesic: Secondary | ICD-10-CM | POA: Diagnosis not present

## 2017-07-15 DIAGNOSIS — E785 Hyperlipidemia, unspecified: Secondary | ICD-10-CM

## 2017-07-15 DIAGNOSIS — Z79899 Other long term (current) drug therapy: Secondary | ICD-10-CM | POA: Diagnosis not present

## 2017-07-15 DIAGNOSIS — M545 Low back pain: Secondary | ICD-10-CM | POA: Diagnosis not present

## 2017-07-15 DIAGNOSIS — M5106 Intervertebral disc disorders with myelopathy, lumbar region: Secondary | ICD-10-CM | POA: Diagnosis not present

## 2017-07-15 NOTE — Patient Instructions (Signed)
Medication Instructions:  Your physician recommends that you continue on your current medications as directed. Please refer to the Current Medication list given to you today.  Labwork: None  Testing/Procedures: None  Follow-Up: Your physician recommends that you schedule a follow-up appointment in: 4 months  Any Other Special Instructions Will Be Listed Below (If Applicable).     If you need a refill on your cardiac medications before your next appointment, please call your pharmacy.   [facility]  Saje Gallop A, RN, BSN  
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
## 2017-08-02 IMAGING — CR DG CHEST 2V
2 series · 2 of 2 positions shown · non-contrast
Comparison: PA and lateral chest 01/22/2014.

CLINICAL DATA: Preoperative examination. Patient for right knee
replacement 01/20/2016. Initial encounter.

EXAM:
CHEST  2 VIEW

[w chest pa]
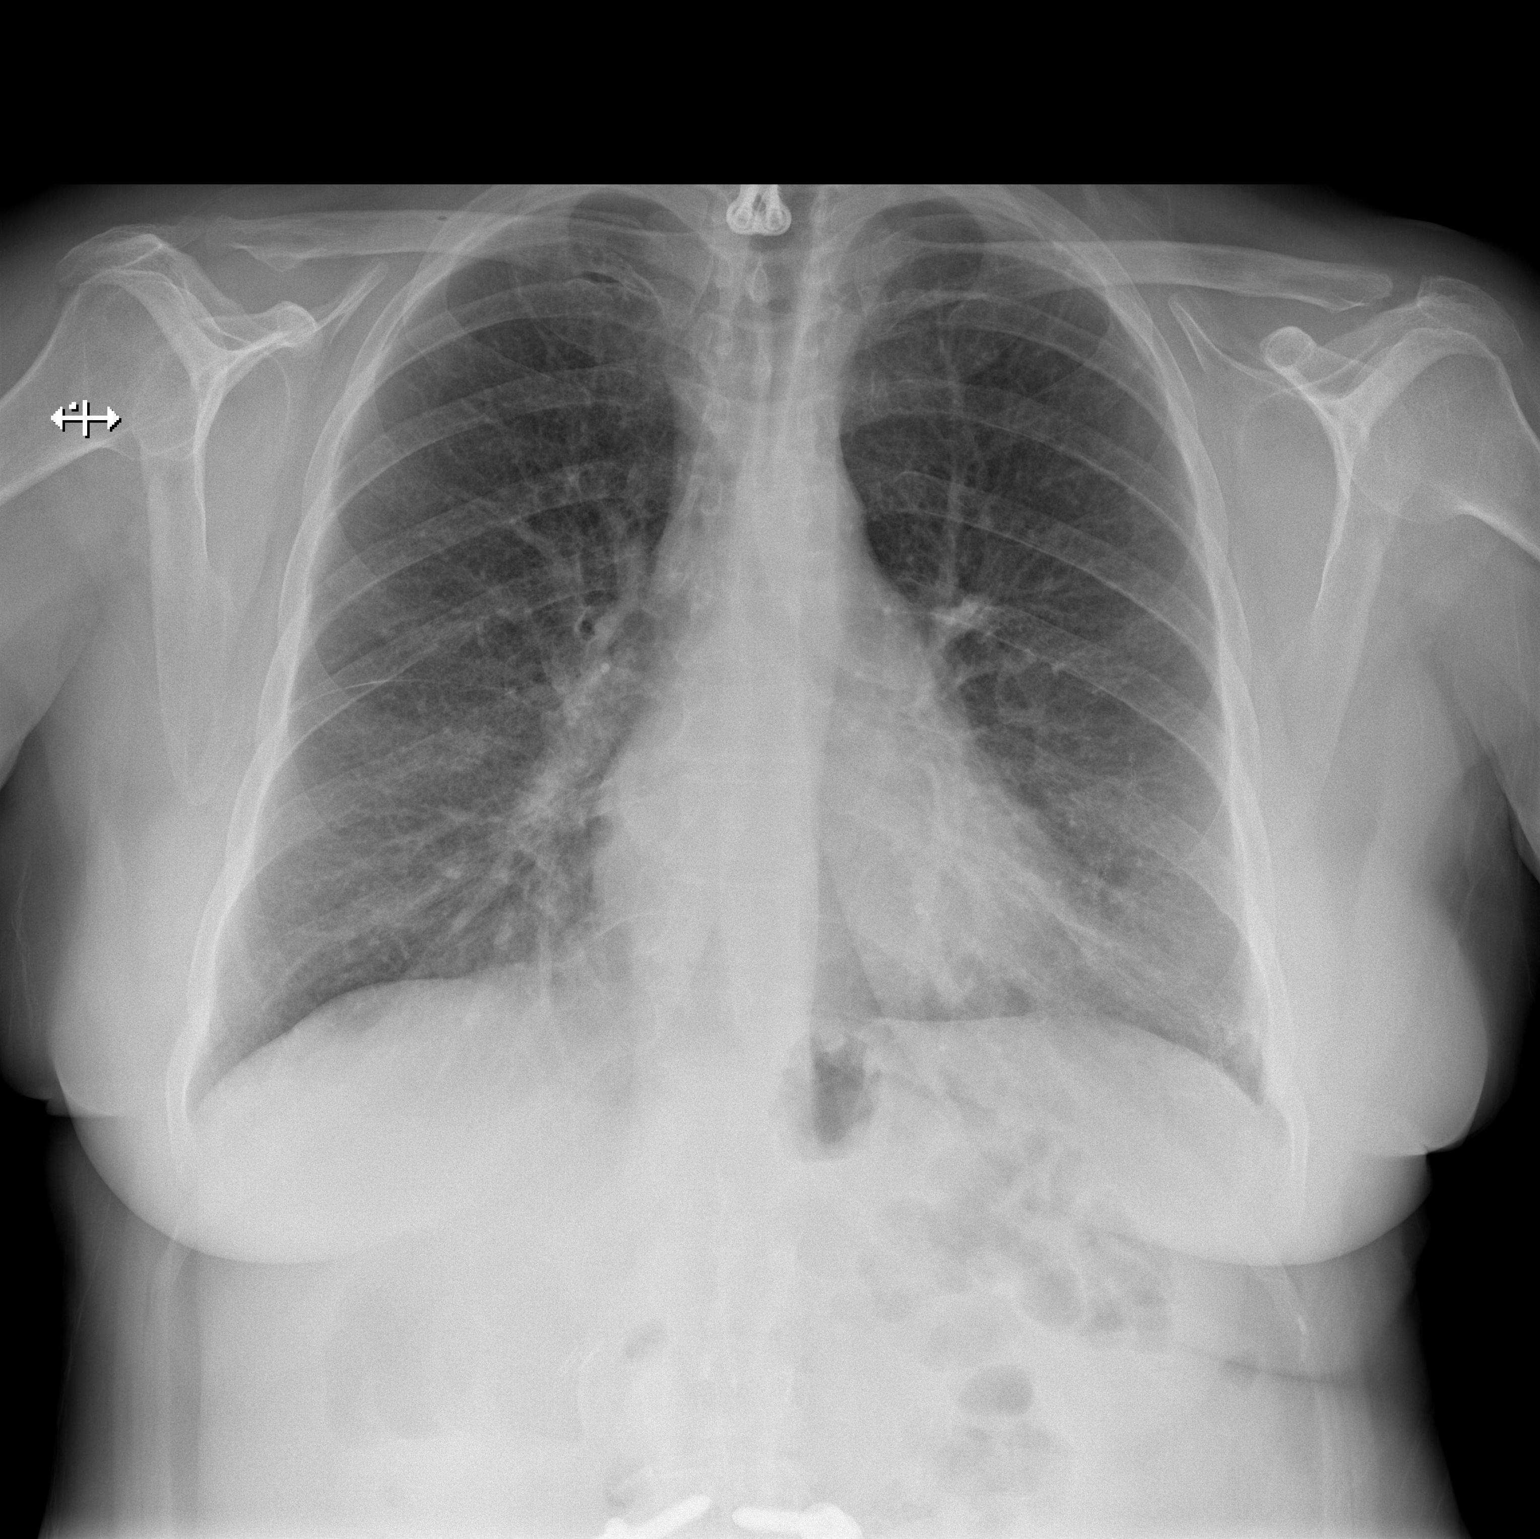

[w chest lat]
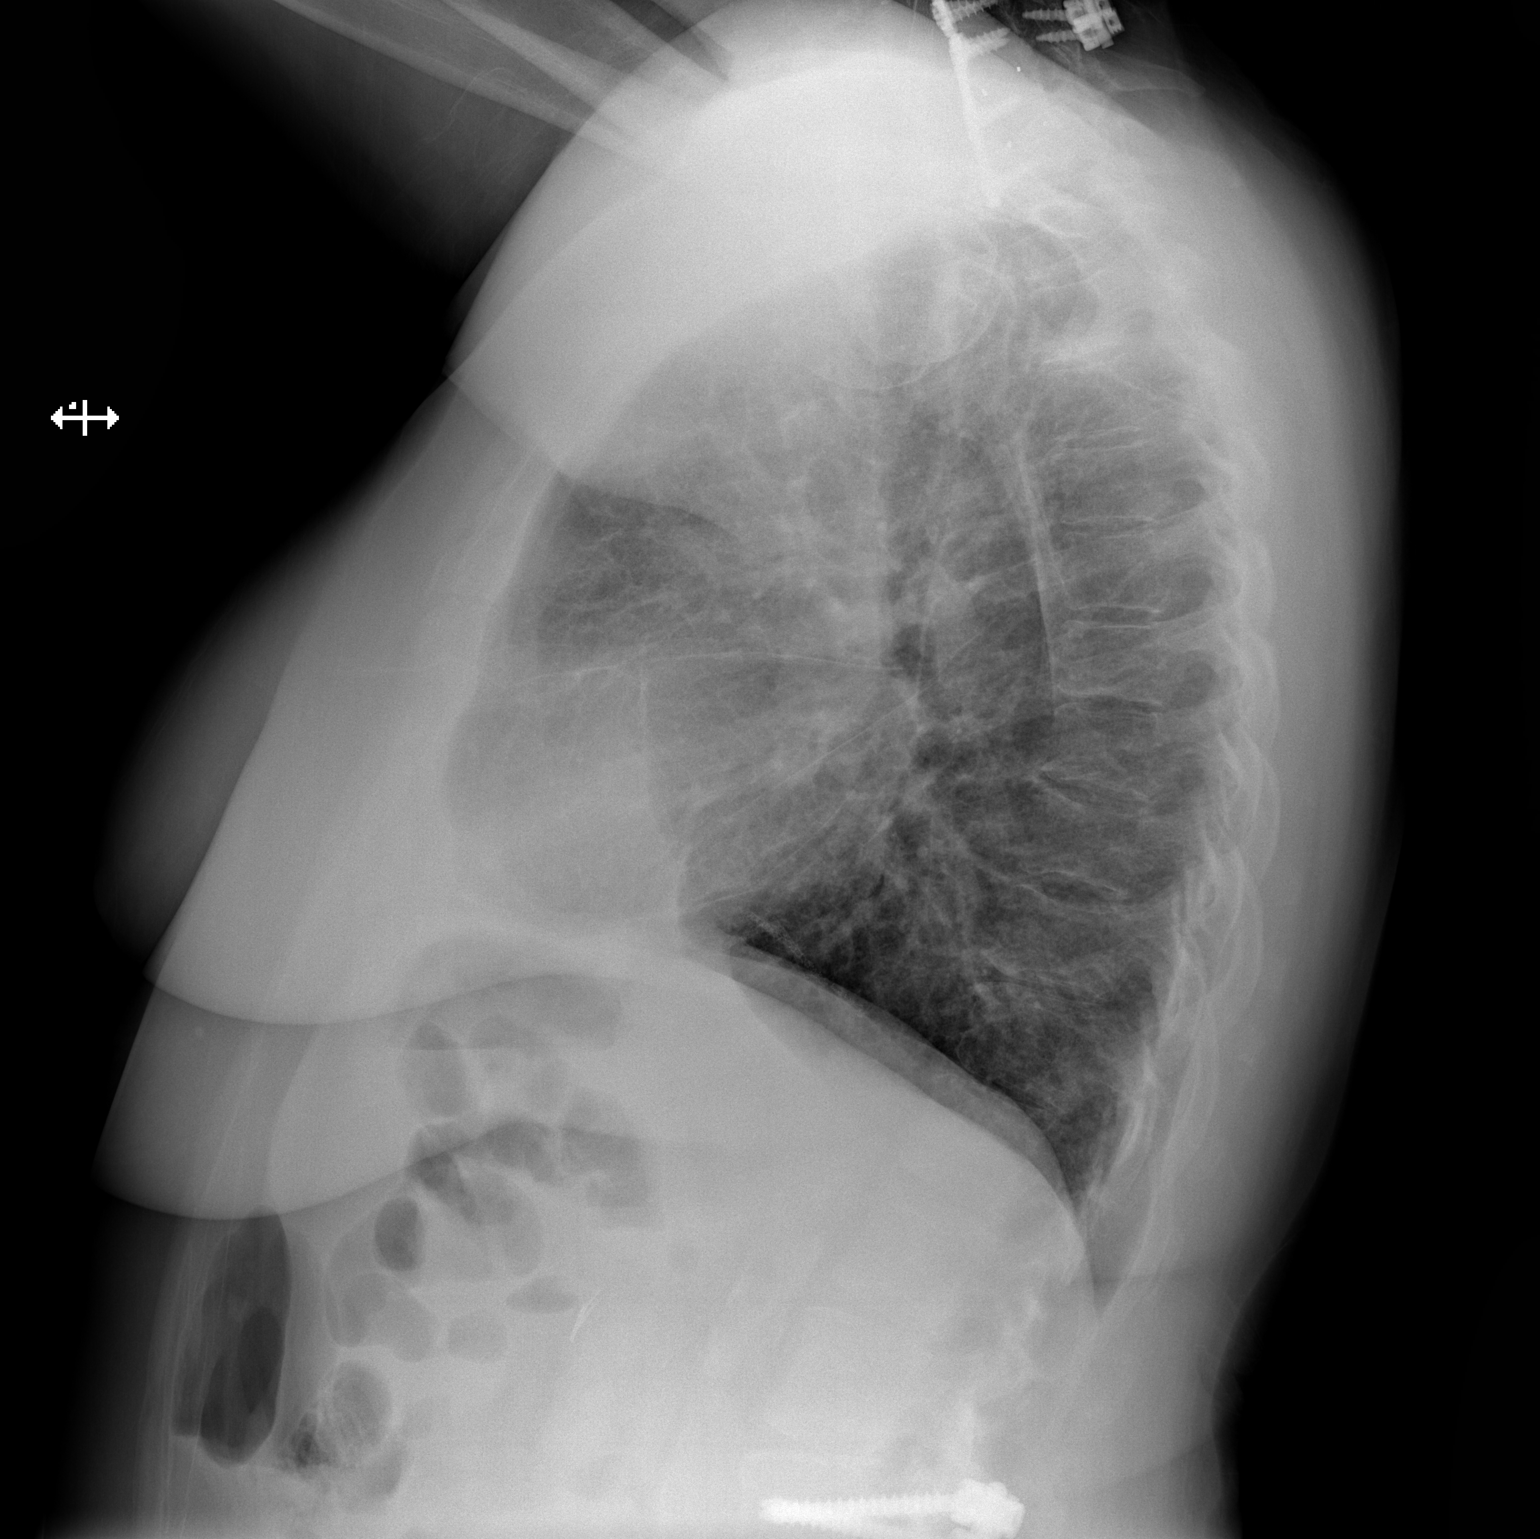

[2 of 2 positions shown; findings below may reference images not displayed]

FINDINGS: The lungs are clear. Heart size is normal. No pneumothorax or
pleural effusion. Postoperative change cervical and lumbar spine is
partially visualized.
IMPRESSION: No acute disease.

## 2017-08-05 DIAGNOSIS — J449 Chronic obstructive pulmonary disease, unspecified: Secondary | ICD-10-CM | POA: Diagnosis not present

## 2017-08-19 DIAGNOSIS — I739 Peripheral vascular disease, unspecified: Secondary | ICD-10-CM | POA: Diagnosis not present

## 2017-08-19 DIAGNOSIS — I6521 Occlusion and stenosis of right carotid artery: Secondary | ICD-10-CM | POA: Diagnosis not present

## 2017-08-19 DIAGNOSIS — Z95828 Presence of other vascular implants and grafts: Secondary | ICD-10-CM | POA: Diagnosis not present

## 2017-08-26 DIAGNOSIS — M4726 Other spondylosis with radiculopathy, lumbar region: Secondary | ICD-10-CM | POA: Diagnosis not present

## 2017-08-26 DIAGNOSIS — M545 Low back pain: Secondary | ICD-10-CM | POA: Diagnosis not present

## 2017-08-26 DIAGNOSIS — G894 Chronic pain syndrome: Secondary | ICD-10-CM | POA: Diagnosis not present

## 2017-09-05 DIAGNOSIS — J449 Chronic obstructive pulmonary disease, unspecified: Secondary | ICD-10-CM | POA: Diagnosis not present

## 2017-09-14 DIAGNOSIS — K1230 Oral mucositis (ulcerative), unspecified: Secondary | ICD-10-CM | POA: Diagnosis not present

## 2017-09-14 DIAGNOSIS — E669 Obesity, unspecified: Secondary | ICD-10-CM | POA: Diagnosis not present

## 2017-09-14 DIAGNOSIS — K140 Glossitis: Secondary | ICD-10-CM | POA: Diagnosis not present

## 2017-09-14 DIAGNOSIS — Z1231 Encounter for screening mammogram for malignant neoplasm of breast: Secondary | ICD-10-CM | POA: Diagnosis not present

## 2017-09-14 DIAGNOSIS — J029 Acute pharyngitis, unspecified: Secondary | ICD-10-CM | POA: Diagnosis not present

## 2017-09-14 DIAGNOSIS — Z6831 Body mass index (BMI) 31.0-31.9, adult: Secondary | ICD-10-CM | POA: Diagnosis not present

## 2017-09-17 DIAGNOSIS — M5106 Intervertebral disc disorders with myelopathy, lumbar region: Secondary | ICD-10-CM | POA: Diagnosis not present

## 2017-09-17 DIAGNOSIS — M542 Cervicalgia: Secondary | ICD-10-CM | POA: Diagnosis not present

## 2017-09-17 DIAGNOSIS — M5030 Other cervical disc degeneration, unspecified cervical region: Secondary | ICD-10-CM | POA: Diagnosis not present

## 2017-09-17 DIAGNOSIS — G894 Chronic pain syndrome: Secondary | ICD-10-CM | POA: Diagnosis not present

## 2017-09-17 DIAGNOSIS — M545 Low back pain: Secondary | ICD-10-CM | POA: Diagnosis not present

## 2017-09-17 DIAGNOSIS — Z4689 Encounter for fitting and adjustment of other specified devices: Secondary | ICD-10-CM | POA: Diagnosis not present

## 2017-09-17 DIAGNOSIS — M4726 Other spondylosis with radiculopathy, lumbar region: Secondary | ICD-10-CM | POA: Diagnosis not present

## 2017-09-22 DIAGNOSIS — G4711 Idiopathic hypersomnia with long sleep time: Secondary | ICD-10-CM | POA: Diagnosis not present

## 2017-09-23 DIAGNOSIS — M1712 Unilateral primary osteoarthritis, left knee: Secondary | ICD-10-CM | POA: Diagnosis not present

## 2017-09-23 DIAGNOSIS — M25561 Pain in right knee: Secondary | ICD-10-CM | POA: Diagnosis not present

## 2017-09-23 DIAGNOSIS — Z96652 Presence of left artificial knee joint: Secondary | ICD-10-CM | POA: Diagnosis not present

## 2017-09-23 DIAGNOSIS — M25562 Pain in left knee: Secondary | ICD-10-CM | POA: Diagnosis not present

## 2017-09-23 DIAGNOSIS — Z96651 Presence of right artificial knee joint: Secondary | ICD-10-CM | POA: Diagnosis not present

## 2017-09-23 DIAGNOSIS — Z471 Aftercare following joint replacement surgery: Secondary | ICD-10-CM | POA: Diagnosis not present

## 2017-09-29 DIAGNOSIS — M25561 Pain in right knee: Secondary | ICD-10-CM | POA: Diagnosis not present

## 2017-09-29 DIAGNOSIS — R69 Illness, unspecified: Secondary | ICD-10-CM | POA: Diagnosis not present

## 2017-09-29 DIAGNOSIS — M48061 Spinal stenosis, lumbar region without neurogenic claudication: Secondary | ICD-10-CM | POA: Diagnosis not present

## 2017-09-29 DIAGNOSIS — M4716 Other spondylosis with myelopathy, lumbar region: Secondary | ICD-10-CM | POA: Diagnosis not present

## 2017-09-29 DIAGNOSIS — Z01810 Encounter for preprocedural cardiovascular examination: Secondary | ICD-10-CM | POA: Diagnosis not present

## 2017-09-29 DIAGNOSIS — Z01812 Encounter for preprocedural laboratory examination: Secondary | ICD-10-CM | POA: Diagnosis not present

## 2017-10-04 DIAGNOSIS — M4326 Fusion of spine, lumbar region: Secondary | ICD-10-CM | POA: Diagnosis not present

## 2017-10-04 DIAGNOSIS — T84296A Other mechanical complication of internal fixation device of vertebrae, initial encounter: Secondary | ICD-10-CM | POA: Diagnosis not present

## 2017-10-04 DIAGNOSIS — R296 Repeated falls: Secondary | ICD-10-CM | POA: Diagnosis not present

## 2017-10-04 DIAGNOSIS — M4716 Other spondylosis with myelopathy, lumbar region: Secondary | ICD-10-CM | POA: Diagnosis not present

## 2017-10-04 DIAGNOSIS — M4126 Other idiopathic scoliosis, lumbar region: Secondary | ICD-10-CM | POA: Diagnosis not present

## 2017-10-04 DIAGNOSIS — E1151 Type 2 diabetes mellitus with diabetic peripheral angiopathy without gangrene: Secondary | ICD-10-CM | POA: Diagnosis not present

## 2017-10-04 DIAGNOSIS — K219 Gastro-esophageal reflux disease without esophagitis: Secondary | ICD-10-CM | POA: Diagnosis not present

## 2017-10-04 DIAGNOSIS — I2510 Atherosclerotic heart disease of native coronary artery without angina pectoris: Secondary | ICD-10-CM | POA: Diagnosis not present

## 2017-10-04 DIAGNOSIS — M961 Postlaminectomy syndrome, not elsewhere classified: Secondary | ICD-10-CM | POA: Diagnosis not present

## 2017-10-04 DIAGNOSIS — G894 Chronic pain syndrome: Secondary | ICD-10-CM | POA: Diagnosis not present

## 2017-10-04 DIAGNOSIS — Z96651 Presence of right artificial knee joint: Secondary | ICD-10-CM | POA: Diagnosis not present

## 2017-10-04 DIAGNOSIS — Z981 Arthrodesis status: Secondary | ICD-10-CM | POA: Diagnosis not present

## 2017-10-04 DIAGNOSIS — M4316 Spondylolisthesis, lumbar region: Secondary | ICD-10-CM | POA: Diagnosis not present

## 2017-10-04 DIAGNOSIS — M48062 Spinal stenosis, lumbar region with neurogenic claudication: Secondary | ICD-10-CM | POA: Diagnosis not present

## 2017-10-04 DIAGNOSIS — R69 Illness, unspecified: Secondary | ICD-10-CM | POA: Diagnosis not present

## 2017-10-04 DIAGNOSIS — M48061 Spinal stenosis, lumbar region without neurogenic claudication: Secondary | ICD-10-CM | POA: Diagnosis not present

## 2017-10-05 DIAGNOSIS — M5116 Intervertebral disc disorders with radiculopathy, lumbar region: Secondary | ICD-10-CM | POA: Insufficient documentation

## 2017-10-05 HISTORY — DX: Intervertebral disc disorders with radiculopathy, lumbar region: M51.16

## 2017-10-06 DIAGNOSIS — J449 Chronic obstructive pulmonary disease, unspecified: Secondary | ICD-10-CM | POA: Diagnosis not present

## 2017-10-12 DIAGNOSIS — M4326 Fusion of spine, lumbar region: Secondary | ICD-10-CM | POA: Diagnosis not present

## 2017-10-14 ENCOUNTER — Other Ambulatory Visit: Payer: Self-pay | Admitting: Orthopaedic Surgery

## 2017-10-14 DIAGNOSIS — R1010 Upper abdominal pain, unspecified: Secondary | ICD-10-CM

## 2017-10-19 DIAGNOSIS — M1712 Unilateral primary osteoarthritis, left knee: Secondary | ICD-10-CM | POA: Diagnosis not present

## 2017-10-19 DIAGNOSIS — G8929 Other chronic pain: Secondary | ICD-10-CM | POA: Diagnosis not present

## 2017-10-29 DIAGNOSIS — R1012 Left upper quadrant pain: Secondary | ICD-10-CM | POA: Diagnosis not present

## 2017-10-29 DIAGNOSIS — Z6830 Body mass index (BMI) 30.0-30.9, adult: Secondary | ICD-10-CM | POA: Diagnosis not present

## 2017-11-01 DIAGNOSIS — R1012 Left upper quadrant pain: Secondary | ICD-10-CM | POA: Diagnosis not present

## 2017-11-01 DIAGNOSIS — I700 Atherosclerosis of aorta: Secondary | ICD-10-CM | POA: Diagnosis not present

## 2017-11-01 DIAGNOSIS — I2510 Atherosclerotic heart disease of native coronary artery without angina pectoris: Secondary | ICD-10-CM | POA: Diagnosis not present

## 2017-11-02 DIAGNOSIS — E782 Mixed hyperlipidemia: Secondary | ICD-10-CM | POA: Diagnosis not present

## 2017-11-02 DIAGNOSIS — Z79899 Other long term (current) drug therapy: Secondary | ICD-10-CM | POA: Diagnosis not present

## 2017-11-02 DIAGNOSIS — Z1331 Encounter for screening for depression: Secondary | ICD-10-CM | POA: Diagnosis not present

## 2017-11-02 DIAGNOSIS — I2510 Atherosclerotic heart disease of native coronary artery without angina pectoris: Secondary | ICD-10-CM | POA: Diagnosis not present

## 2017-11-02 DIAGNOSIS — R69 Illness, unspecified: Secondary | ICD-10-CM | POA: Diagnosis not present

## 2017-11-02 DIAGNOSIS — E119 Type 2 diabetes mellitus without complications: Secondary | ICD-10-CM | POA: Diagnosis not present

## 2017-11-02 DIAGNOSIS — I10 Essential (primary) hypertension: Secondary | ICD-10-CM | POA: Diagnosis not present

## 2017-11-02 DIAGNOSIS — J449 Chronic obstructive pulmonary disease, unspecified: Secondary | ICD-10-CM | POA: Diagnosis not present

## 2017-11-02 DIAGNOSIS — Z6830 Body mass index (BMI) 30.0-30.9, adult: Secondary | ICD-10-CM | POA: Diagnosis not present

## 2017-11-03 DIAGNOSIS — J449 Chronic obstructive pulmonary disease, unspecified: Secondary | ICD-10-CM | POA: Diagnosis not present

## 2017-11-12 ENCOUNTER — Ambulatory Visit: Payer: Medicare Other | Admitting: Cardiology

## 2017-11-18 DIAGNOSIS — H524 Presbyopia: Secondary | ICD-10-CM | POA: Diagnosis not present

## 2017-11-22 ENCOUNTER — Encounter: Payer: Self-pay | Admitting: Cardiology

## 2017-11-22 ENCOUNTER — Ambulatory Visit (INDEPENDENT_AMBULATORY_CARE_PROVIDER_SITE_OTHER): Payer: Medicare HMO | Admitting: Cardiology

## 2017-11-22 VITALS — BP 128/72 | HR 107 | Ht 62.0 in | Wt 164.8 lb

## 2017-11-22 DIAGNOSIS — F17200 Nicotine dependence, unspecified, uncomplicated: Secondary | ICD-10-CM

## 2017-11-22 DIAGNOSIS — Z86711 Personal history of pulmonary embolism: Secondary | ICD-10-CM

## 2017-11-22 DIAGNOSIS — E785 Hyperlipidemia, unspecified: Secondary | ICD-10-CM | POA: Diagnosis not present

## 2017-11-22 DIAGNOSIS — R69 Illness, unspecified: Secondary | ICD-10-CM | POA: Diagnosis not present

## 2017-11-22 DIAGNOSIS — E088 Diabetes mellitus due to underlying condition with unspecified complications: Secondary | ICD-10-CM | POA: Diagnosis not present

## 2017-11-22 DIAGNOSIS — I25119 Atherosclerotic heart disease of native coronary artery with unspecified angina pectoris: Secondary | ICD-10-CM

## 2017-11-22 DIAGNOSIS — I10 Essential (primary) hypertension: Secondary | ICD-10-CM | POA: Diagnosis not present

## 2017-11-22 DIAGNOSIS — I70213 Atherosclerosis of native arteries of extremities with intermittent claudication, bilateral legs: Secondary | ICD-10-CM
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
## 2017-11-22 NOTE — Patient Instructions (Signed)

## 2017-11-23 DIAGNOSIS — S99922A Unspecified injury of left foot, initial encounter: Secondary | ICD-10-CM | POA: Diagnosis not present

## 2017-11-23 DIAGNOSIS — Z6829 Body mass index (BMI) 29.0-29.9, adult: Secondary | ICD-10-CM | POA: Diagnosis not present

## 2017-11-23 DIAGNOSIS — E669 Obesity, unspecified: Secondary | ICD-10-CM | POA: Diagnosis not present

## 2017-11-23 DIAGNOSIS — M79672 Pain in left foot: Secondary | ICD-10-CM | POA: Diagnosis not present

## 2017-11-26 DIAGNOSIS — R69 Illness, unspecified: Secondary | ICD-10-CM | POA: Diagnosis not present

## 2017-11-26 DIAGNOSIS — I70213 Atherosclerosis of native arteries of extremities with intermittent claudication, bilateral legs: Secondary | ICD-10-CM | POA: Diagnosis not present

## 2017-11-26 DIAGNOSIS — I6523 Occlusion and stenosis of bilateral carotid arteries: Secondary | ICD-10-CM | POA: Diagnosis not present

## 2017-11-26 DIAGNOSIS — I10 Essential (primary) hypertension: Secondary | ICD-10-CM | POA: Diagnosis not present

## 2017-12-04 DIAGNOSIS — J449 Chronic obstructive pulmonary disease, unspecified: Secondary | ICD-10-CM | POA: Diagnosis not present

## 2017-12-30 DIAGNOSIS — M4326 Fusion of spine, lumbar region: Secondary | ICD-10-CM | POA: Diagnosis not present

## 2018-01-03 DIAGNOSIS — J449 Chronic obstructive pulmonary disease, unspecified: Secondary | ICD-10-CM | POA: Diagnosis not present

## 2018-01-27 DIAGNOSIS — M4326 Fusion of spine, lumbar region: Secondary | ICD-10-CM | POA: Diagnosis not present

## 2018-01-27 DIAGNOSIS — Z79899 Other long term (current) drug therapy: Secondary | ICD-10-CM | POA: Diagnosis not present

## 2018-01-27 DIAGNOSIS — G894 Chronic pain syndrome: Secondary | ICD-10-CM | POA: Diagnosis not present

## 2018-01-27 DIAGNOSIS — Z79891 Long term (current) use of opiate analgesic: Secondary | ICD-10-CM | POA: Diagnosis not present

## 2018-02-03 DIAGNOSIS — J449 Chronic obstructive pulmonary disease, unspecified: Secondary | ICD-10-CM | POA: Diagnosis not present

## 2018-02-07 DIAGNOSIS — R69 Illness, unspecified: Secondary | ICD-10-CM | POA: Diagnosis not present

## 2018-02-07 DIAGNOSIS — Z6829 Body mass index (BMI) 29.0-29.9, adult: Secondary | ICD-10-CM | POA: Diagnosis not present

## 2018-02-07 DIAGNOSIS — E119 Type 2 diabetes mellitus without complications: Secondary | ICD-10-CM | POA: Diagnosis not present

## 2018-02-07 DIAGNOSIS — E782 Mixed hyperlipidemia: Secondary | ICD-10-CM | POA: Diagnosis not present

## 2018-02-07 DIAGNOSIS — R198 Other specified symptoms and signs involving the digestive system and abdomen: Secondary | ICD-10-CM | POA: Diagnosis not present

## 2018-02-07 DIAGNOSIS — I2510 Atherosclerotic heart disease of native coronary artery without angina pectoris: Secondary | ICD-10-CM | POA: Diagnosis not present

## 2018-02-07 DIAGNOSIS — K219 Gastro-esophageal reflux disease without esophagitis: Secondary | ICD-10-CM | POA: Diagnosis not present

## 2018-02-07 DIAGNOSIS — J449 Chronic obstructive pulmonary disease, unspecified: Secondary | ICD-10-CM | POA: Diagnosis not present

## 2018-02-07 DIAGNOSIS — Z79899 Other long term (current) drug therapy: Secondary | ICD-10-CM | POA: Diagnosis not present

## 2018-02-07 DIAGNOSIS — Z1339 Encounter for screening examination for other mental health and behavioral disorders: Secondary | ICD-10-CM | POA: Diagnosis not present

## 2018-02-09 DIAGNOSIS — G8929 Other chronic pain: Secondary | ICD-10-CM | POA: Diagnosis not present

## 2018-02-09 DIAGNOSIS — M1712 Unilateral primary osteoarthritis, left knee: Secondary | ICD-10-CM | POA: Diagnosis not present

## 2018-02-18 ENCOUNTER — Encounter: Payer: Self-pay | Admitting: Gastroenterology

## 2018-03-05 DIAGNOSIS — J449 Chronic obstructive pulmonary disease, unspecified: Secondary | ICD-10-CM | POA: Diagnosis not present

## 2018-03-09 DIAGNOSIS — M5106 Intervertebral disc disorders with myelopathy, lumbar region: Secondary | ICD-10-CM | POA: Diagnosis not present

## 2018-03-09 DIAGNOSIS — M4326 Fusion of spine, lumbar region: Secondary | ICD-10-CM | POA: Diagnosis not present

## 2018-03-09 DIAGNOSIS — G894 Chronic pain syndrome: Secondary | ICD-10-CM | POA: Diagnosis not present

## 2018-03-09 DIAGNOSIS — M542 Cervicalgia: Secondary | ICD-10-CM | POA: Diagnosis not present

## 2018-03-14 DIAGNOSIS — E782 Mixed hyperlipidemia: Secondary | ICD-10-CM | POA: Diagnosis not present

## 2018-03-14 DIAGNOSIS — Z6829 Body mass index (BMI) 29.0-29.9, adult: Secondary | ICD-10-CM | POA: Diagnosis not present

## 2018-03-14 DIAGNOSIS — R198 Other specified symptoms and signs involving the digestive system and abdomen: Secondary | ICD-10-CM | POA: Diagnosis not present

## 2018-03-14 DIAGNOSIS — K219 Gastro-esophageal reflux disease without esophagitis: Secondary | ICD-10-CM | POA: Diagnosis not present

## 2018-03-14 DIAGNOSIS — I10 Essential (primary) hypertension: Secondary | ICD-10-CM | POA: Diagnosis not present

## 2018-03-14 DIAGNOSIS — R69 Illness, unspecified: Secondary | ICD-10-CM | POA: Diagnosis not present

## 2018-03-14 DIAGNOSIS — E119 Type 2 diabetes mellitus without complications: Secondary | ICD-10-CM | POA: Diagnosis not present

## 2018-03-14 DIAGNOSIS — I2510 Atherosclerotic heart disease of native coronary artery without angina pectoris: Secondary | ICD-10-CM | POA: Diagnosis not present

## 2018-03-24 DIAGNOSIS — N3000 Acute cystitis without hematuria: Secondary | ICD-10-CM | POA: Diagnosis not present

## 2018-03-24 DIAGNOSIS — Z6827 Body mass index (BMI) 27.0-27.9, adult: Secondary | ICD-10-CM | POA: Diagnosis not present

## 2018-03-25 ENCOUNTER — Encounter: Payer: Self-pay | Admitting: Gastroenterology

## 2018-03-31 ENCOUNTER — Ambulatory Visit (INDEPENDENT_AMBULATORY_CARE_PROVIDER_SITE_OTHER): Payer: Medicare HMO | Admitting: Gastroenterology

## 2018-03-31 ENCOUNTER — Telehealth: Payer: Self-pay

## 2018-03-31 ENCOUNTER — Encounter: Payer: Self-pay | Admitting: Gastroenterology

## 2018-03-31 VITALS — BP 160/80 | HR 104 | Ht 62.0 in | Wt 158.4 lb

## 2018-03-31 DIAGNOSIS — R1310 Dysphagia, unspecified: Secondary | ICD-10-CM

## 2018-03-31 DIAGNOSIS — K219 Gastro-esophageal reflux disease without esophagitis: Secondary | ICD-10-CM

## 2018-03-31 MED ORDER — PANTOPRAZOLE SODIUM 40 MG PO TBEC: 40.0000 mg | DELAYED_RELEASE_TABLET | Freq: Every day | ORAL | 11 refills | Status: DC

## 2018-03-31 NOTE — Patient Instructions (Addendum)
If you are age 60 or older, your body mass index should be between 23-30. Your Body mass index is 28.97 kg/m. If this is out of the aforementioned range listed, please consider follow up with your Primary Care Provider.  If you are age 81 or younger, your body mass index should be between 19-25. Your Body mass index is 28.97 kg/m. If this is out of the aformentioned range listed, please consider follow up with your Primary Care Provider.   We have sent the following medications to your pharmacy for you to pick up at your convenience: Pantoprazole 40 mg once daily.  Please make an appointment with your cardiologist.    You have been scheduled for a Barium Esophogram at [facility] (1st floor of the hospital) on 04/13/18 at 11am. Please arrive 15 minutes prior to your appointment for registration. Make certain not to have anything to eat or drink 6 hours prior to your test. If you need to reschedule for any reason, please contact radiology at [phone] to do so. __________________________________________________________________ A barium swallow is an examination that concentrates on views of the esophagus. This tends to be a double contrast exam (barium and two liquids which, when combined, create a gas to distend the wall of the oesophagus) or single contrast (non-ionic iodine based). The study is usually tailored to your symptoms so a good history is essential. Attention is paid during the study to the form, structure and configuration of the esophagus, looking for functional disorders (such as aspiration, dysphagia, achalasia, motility and reflux) EXAMINATION You may be asked to change into a gown, depending on the type of swallow being performed. A radiologist and radiographer will perform the procedure. The radiologist will advise you of the type of contrast selected for your procedure and direct you during the exam. You will be asked to stand, sit or lie in several different positions  and to hold a small amount of fluid in your mouth before being asked to swallow while the imaging is performed .In some instances you may be asked to swallow barium coated marshmallows to assess the motility of a solid food bolus. The exam can be recorded as a digital or video fluoroscopy procedure. POST PROCEDURE It will take 1-2 days for the barium to pass through your system. To facilitate this, it is important, unless otherwise directed, to increase your fluids for the next 24-48hrs and to resume your normal diet.  This test typically takes about 30 minutes to perform. __________________________________________________________________________________   Thank you,  Dr. Jackquline Denmark
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
## 2018-04-05 DIAGNOSIS — J449 Chronic obstructive pulmonary disease, unspecified: Secondary | ICD-10-CM | POA: Diagnosis not present

## 2018-04-06 DIAGNOSIS — M5106 Intervertebral disc disorders with myelopathy, lumbar region: Secondary | ICD-10-CM | POA: Diagnosis not present

## 2018-04-06 DIAGNOSIS — M542 Cervicalgia: Secondary | ICD-10-CM | POA: Diagnosis not present

## 2018-04-06 DIAGNOSIS — G894 Chronic pain syndrome: Secondary | ICD-10-CM | POA: Diagnosis not present

## 2018-04-06 DIAGNOSIS — M4326 Fusion of spine, lumbar region: Secondary | ICD-10-CM | POA: Diagnosis not present

## 2018-04-13 ENCOUNTER — Ambulatory Visit (HOSPITAL_COMMUNITY): Payer: Medicare HMO

## 2018-04-18 ENCOUNTER — Ambulatory Visit (HOSPITAL_COMMUNITY): Payer: Medicare HMO

## 2018-05-06 DIAGNOSIS — J449 Chronic obstructive pulmonary disease, unspecified: Secondary | ICD-10-CM | POA: Diagnosis not present

## 2018-05-13 DIAGNOSIS — Z6828 Body mass index (BMI) 28.0-28.9, adult: Secondary | ICD-10-CM | POA: Diagnosis not present

## 2018-05-13 DIAGNOSIS — R6889 Other general symptoms and signs: Secondary | ICD-10-CM | POA: Diagnosis not present

## 2018-05-13 DIAGNOSIS — J208 Acute bronchitis due to other specified organisms: Secondary | ICD-10-CM | POA: Diagnosis not present

## 2018-05-24 DIAGNOSIS — G894 Chronic pain syndrome: Secondary | ICD-10-CM | POA: Diagnosis not present

## 2018-05-24 DIAGNOSIS — M542 Cervicalgia: Secondary | ICD-10-CM | POA: Diagnosis not present

## 2018-05-24 DIAGNOSIS — M4326 Fusion of spine, lumbar region: Secondary | ICD-10-CM | POA: Diagnosis not present

## 2018-06-05 DIAGNOSIS — J449 Chronic obstructive pulmonary disease, unspecified: Secondary | ICD-10-CM | POA: Diagnosis not present

## 2018-06-13 DIAGNOSIS — Z0000 Encounter for general adult medical examination without abnormal findings: Secondary | ICD-10-CM | POA: Diagnosis not present

## 2018-06-13 DIAGNOSIS — E119 Type 2 diabetes mellitus without complications: Secondary | ICD-10-CM | POA: Diagnosis not present

## 2018-06-13 DIAGNOSIS — E669 Obesity, unspecified: Secondary | ICD-10-CM | POA: Diagnosis not present

## 2018-06-13 DIAGNOSIS — Z1211 Encounter for screening for malignant neoplasm of colon: Secondary | ICD-10-CM | POA: Diagnosis not present

## 2018-06-13 DIAGNOSIS — R69 Illness, unspecified: Secondary | ICD-10-CM | POA: Diagnosis not present

## 2018-06-13 DIAGNOSIS — E782 Mixed hyperlipidemia: Secondary | ICD-10-CM | POA: Diagnosis not present

## 2018-06-13 DIAGNOSIS — J209 Acute bronchitis, unspecified: Secondary | ICD-10-CM | POA: Diagnosis not present

## 2018-06-13 DIAGNOSIS — Z6828 Body mass index (BMI) 28.0-28.9, adult: Secondary | ICD-10-CM | POA: Diagnosis not present

## 2018-06-14 ENCOUNTER — Telehealth: Payer: Self-pay | Admitting: Gastroenterology

## 2018-06-14 NOTE — Telephone Encounter (Signed)
No I wasn't working on this, I didn't know that she needed it rescheduled.

## 2018-06-14 NOTE — Telephone Encounter (Signed)
Barbara French are you working on this? I called the pt and she states she just got a call and is waiting on someone to call her back with the barium swallow. Just want to make sure we both are not trying to do the same thing.

## 2018-06-14 NOTE — Telephone Encounter (Signed)
Pt scheduled for barium swallow at [facility] 07/04/18@9 :30am, pt to arrive there at 9:15am, pt to be NPO after 6:30am. Pt scheduled to see Dr. Lyndel Safe 07/11/18@10 :45am. Pt aware of appts.

## 2018-06-27 ENCOUNTER — Ambulatory Visit (INDEPENDENT_AMBULATORY_CARE_PROVIDER_SITE_OTHER): Payer: Medicare HMO | Admitting: Cardiology

## 2018-06-27 ENCOUNTER — Ambulatory Visit: Payer: Medicare HMO | Admitting: Cardiology

## 2018-06-27 ENCOUNTER — Encounter: Payer: Self-pay | Admitting: Cardiology

## 2018-06-27 VITALS — BP 126/78 | HR 97 | Ht 62.0 in | Wt 160.0 lb

## 2018-06-27 DIAGNOSIS — I25119 Atherosclerotic heart disease of native coronary artery with unspecified angina pectoris: Secondary | ICD-10-CM | POA: Diagnosis not present

## 2018-06-27 DIAGNOSIS — I70213 Atherosclerosis of native arteries of extremities with intermittent claudication, bilateral legs: Secondary | ICD-10-CM | POA: Diagnosis not present

## 2018-06-27 DIAGNOSIS — Z9889 Other specified postprocedural states: Secondary | ICD-10-CM | POA: Diagnosis not present

## 2018-06-27 DIAGNOSIS — R69 Illness, unspecified: Secondary | ICD-10-CM | POA: Diagnosis not present

## 2018-06-27 DIAGNOSIS — Z86711 Personal history of pulmonary embolism: Secondary | ICD-10-CM

## 2018-06-27 DIAGNOSIS — I10 Essential (primary) hypertension: Secondary | ICD-10-CM | POA: Diagnosis not present

## 2018-06-27 DIAGNOSIS — I209 Angina pectoris, unspecified: Secondary | ICD-10-CM

## 2018-06-27 DIAGNOSIS — I6523 Occlusion and stenosis of bilateral carotid arteries: Secondary | ICD-10-CM

## 2018-06-27 DIAGNOSIS — F17200 Nicotine dependence, unspecified, uncomplicated: Secondary | ICD-10-CM

## 2018-06-27 HISTORY — DX: Angina pectoris, unspecified: I20.9

## 2018-06-27 MED ORDER — NITROGLYCERIN 0.4 MG SL SUBL: 0.4000 mg | SUBLINGUAL_TABLET | SUBLINGUAL | 11 refills | Status: DC | PRN

## 2018-06-27 MED ORDER — ASPIRIN EC 81 MG PO TBEC: 81.0000 mg | DELAYED_RELEASE_TABLET | Freq: Every day | ORAL | 1 refills | Status: DC

## 2018-06-27 NOTE — Patient Instructions (Signed)
Medication Instructions:  Your physician has recommended you make the following change in your medication: START: Aspirin 81 mg daily Nitroglycerin 0.4 SUB tablet every 5 minutes PRN    If you need a refill on your cardiac medications before your next oday.appointment, please call your pharmacy.   Lab work: None If you have labs (blood work) drawn today and your tests are completely normal, you will receive your results only by: [name] Kitchen MyChart Message (if you have MyChart) OR . A paper copy in the mail If you have any lab test that is abnormal or we need to change your treatment, we will call you to review the results.  Testing/Procedures: Your physician has requested that you have a lexiscan myoview. For further information please visit [email or web address]. Please follow instruction sheet, as given.   Follow-Up: At [facility], you and your health needs are our priority.  As part of our continuing mission to provide you with exceptional heart care, we have created designated Provider Care Teams.  These Care Teams include your primary Cardiologist (physician) and Advanced Practice Providers (APPs -  Physician Assistants and Nurse Practitioners) who all work together to provide you with the care you need, when you need it. You will need a follow up appointment in 6 months.  Please call our office 2 months in advance to schedule this appointment.  You may see No primary care provider on file. or another member of our [employer] Provider Team in [employer]: Jenne Campus, MD . Shirlee More, MD  Any Other Special Instructions Will Be Listed Below (If Applicable).
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
## 2018-07-04 ENCOUNTER — Ambulatory Visit (HOSPITAL_COMMUNITY)
Admission: RE | Admit: 2018-07-04 | Discharge: 2018-07-04 | Disposition: A | Discharge status: Home / Self Care | Payer: Medicare HMO | Source: Ambulatory Visit | Attending: Gastroenterology | Admitting: Gastroenterology

## 2018-07-04 DIAGNOSIS — G894 Chronic pain syndrome: Secondary | ICD-10-CM | POA: Diagnosis not present

## 2018-07-04 DIAGNOSIS — M5106 Intervertebral disc disorders with myelopathy, lumbar region: Secondary | ICD-10-CM | POA: Diagnosis not present

## 2018-07-04 DIAGNOSIS — R1310 Dysphagia, unspecified: Secondary | ICD-10-CM

## 2018-07-04 DIAGNOSIS — K219 Gastro-esophageal reflux disease without esophagitis: Secondary | ICD-10-CM

## 2018-07-06 ENCOUNTER — Telehealth: Payer: Self-pay

## 2018-07-06 DIAGNOSIS — J449 Chronic obstructive pulmonary disease, unspecified: Secondary | ICD-10-CM | POA: Diagnosis not present

## 2018-07-06 NOTE — Telephone Encounter (Signed)
Called and spoke with patient- patient informed of result note and MD recommendations; patient is requesting to cancel her appt on 12/2 19 with Dr. Lyndel Safe until after she has the stress test and is cleared for her procdeure by Dr. Salley Scarlet; appt cancelled per patient request; patient verbalized understanding of information and to call back when cleared by Dr. Salley Scarlet; patient also advised to call back if questions/concerns arise;

## 2018-07-11 ENCOUNTER — Ambulatory Visit: Payer: Medicare HMO | Admitting: Gastroenterology

## 2018-07-13 DIAGNOSIS — I2699 Other pulmonary embolism without acute cor pulmonale: Secondary | ICD-10-CM | POA: Diagnosis not present

## 2018-07-13 DIAGNOSIS — I70213 Atherosclerosis of native arteries of extremities with intermittent claudication, bilateral legs: Secondary | ICD-10-CM | POA: Diagnosis not present

## 2018-07-13 DIAGNOSIS — R69 Illness, unspecified: Secondary | ICD-10-CM | POA: Diagnosis not present

## 2018-07-13 DIAGNOSIS — I6523 Occlusion and stenosis of bilateral carotid arteries: Secondary | ICD-10-CM | POA: Diagnosis not present

## 2018-07-13 DIAGNOSIS — I10 Essential (primary) hypertension: Secondary | ICD-10-CM | POA: Diagnosis not present

## 2018-07-19 DIAGNOSIS — R69 Illness, unspecified: Secondary | ICD-10-CM | POA: Diagnosis not present

## 2018-08-05 DIAGNOSIS — J449 Chronic obstructive pulmonary disease, unspecified: Secondary | ICD-10-CM | POA: Diagnosis not present

## 2018-08-18 ENCOUNTER — Telehealth (HOSPITAL_COMMUNITY): Payer: Self-pay | Admitting: *Deleted

## 2018-08-18 NOTE — Telephone Encounter (Signed)
Patient given detailed instructions per Myocardial Perfusion Study Information Sheet for the test on 08/23/17 Patient notified to arrive 15 minutes early and that it is imperative to arrive on time for appointment to keep from having the test rescheduled.  If you need to cancel or reschedule your appointment, please call the office within 24 hours of your appointment. . Patient verbalized understanding. Kirstie Peri

## 2018-08-23 ENCOUNTER — Ambulatory Visit (INDEPENDENT_AMBULATORY_CARE_PROVIDER_SITE_OTHER): Payer: Medicare Other

## 2018-08-23 VITALS — Ht 62.0 in | Wt 160.0 lb

## 2018-08-23 DIAGNOSIS — R079 Chest pain, unspecified: Secondary | ICD-10-CM | POA: Diagnosis not present

## 2018-08-23 DIAGNOSIS — I25119 Atherosclerotic heart disease of native coronary artery with unspecified angina pectoris: Secondary | ICD-10-CM

## 2018-08-23 LAB — MYOCARDIAL PERFUSION IMAGING
CHL CUP RESTING HR STRESS: 86 {beats}/min
CSEPPHR: 104 {beats}/min
LV dias vol: 61 mL (ref 46–106)
LV sys vol: 20 mL
SDS: 0
SRS: 5
SSS: 5
TID: 1

## 2018-08-23 MED ORDER — REGADENOSON 0.4 MG/5ML IV SOLN
0.4000 mg | Freq: Once | INTRAVENOUS | Status: AC
  Administered 2018-08-23: 0.4 mg via INTRAVENOUS

## 2018-08-23 MED ORDER — TECHNETIUM TC 99M TETROFOSMIN IV KIT
10.9000 | PACK | Freq: Once | INTRAVENOUS | Status: AC | PRN
  Administered 2018-08-23: 10.9 via INTRAVENOUS

## 2018-08-23 MED ORDER — TECHNETIUM TC 99M TETROFOSMIN IV KIT
32.7000 | PACK | Freq: Once | INTRAVENOUS | Status: AC | PRN
  Administered 2018-08-23: 32.7 via INTRAVENOUS

## 2018-08-30 DIAGNOSIS — G4711 Idiopathic hypersomnia with long sleep time: Secondary | ICD-10-CM | POA: Diagnosis not present

## 2018-08-31 DIAGNOSIS — G894 Chronic pain syndrome: Secondary | ICD-10-CM | POA: Diagnosis not present

## 2018-08-31 DIAGNOSIS — M5106 Intervertebral disc disorders with myelopathy, lumbar region: Secondary | ICD-10-CM | POA: Diagnosis not present

## 2018-09-05 DIAGNOSIS — J449 Chronic obstructive pulmonary disease, unspecified: Secondary | ICD-10-CM | POA: Diagnosis not present

## 2018-09-17 IMAGING — DX DG CHEST 2V
2 series · 2 of 2 positions shown · non-contrast
Comparison: 01/27/2016

CLINICAL DATA: Cardiac disease. Recent vocal cord tumor. Evaluation
prior to cardiac catheterization.

EXAM:
CHEST  2 VIEW

[chest pa]
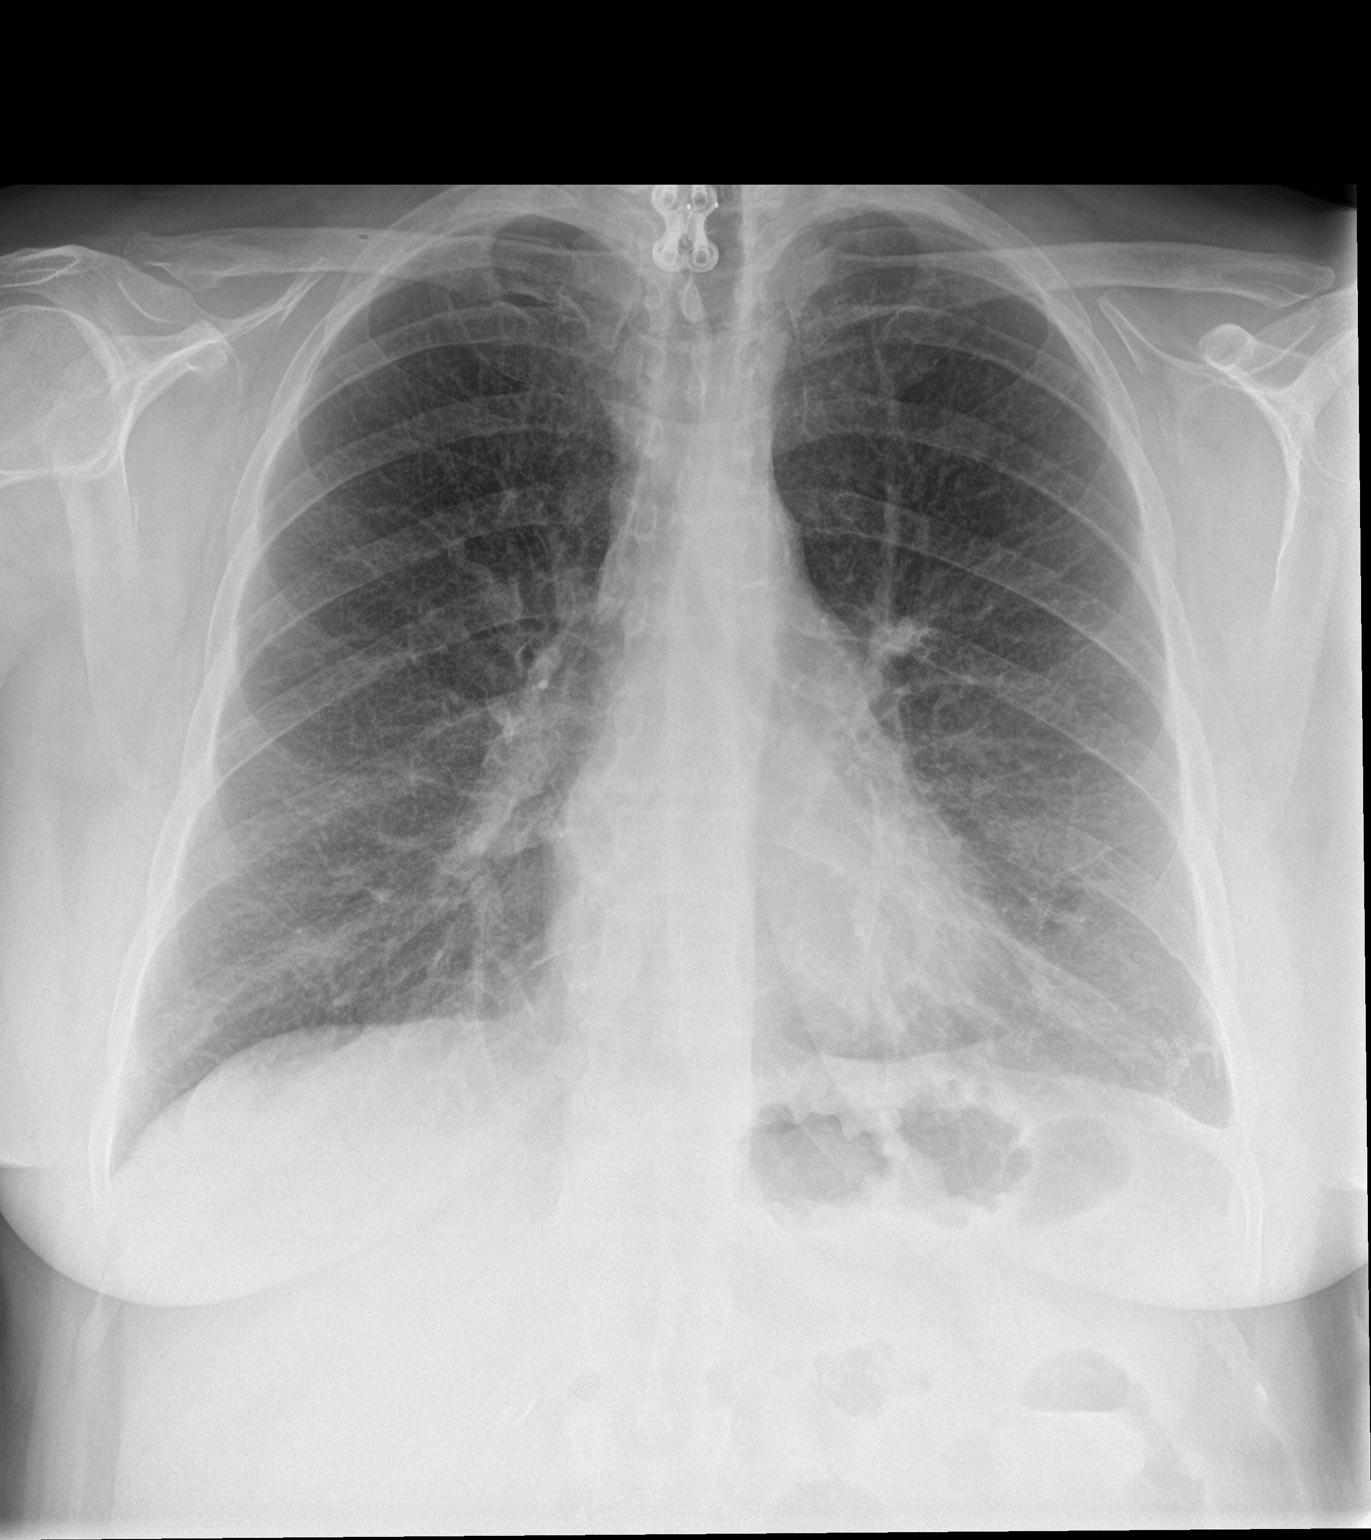

[chest lat]
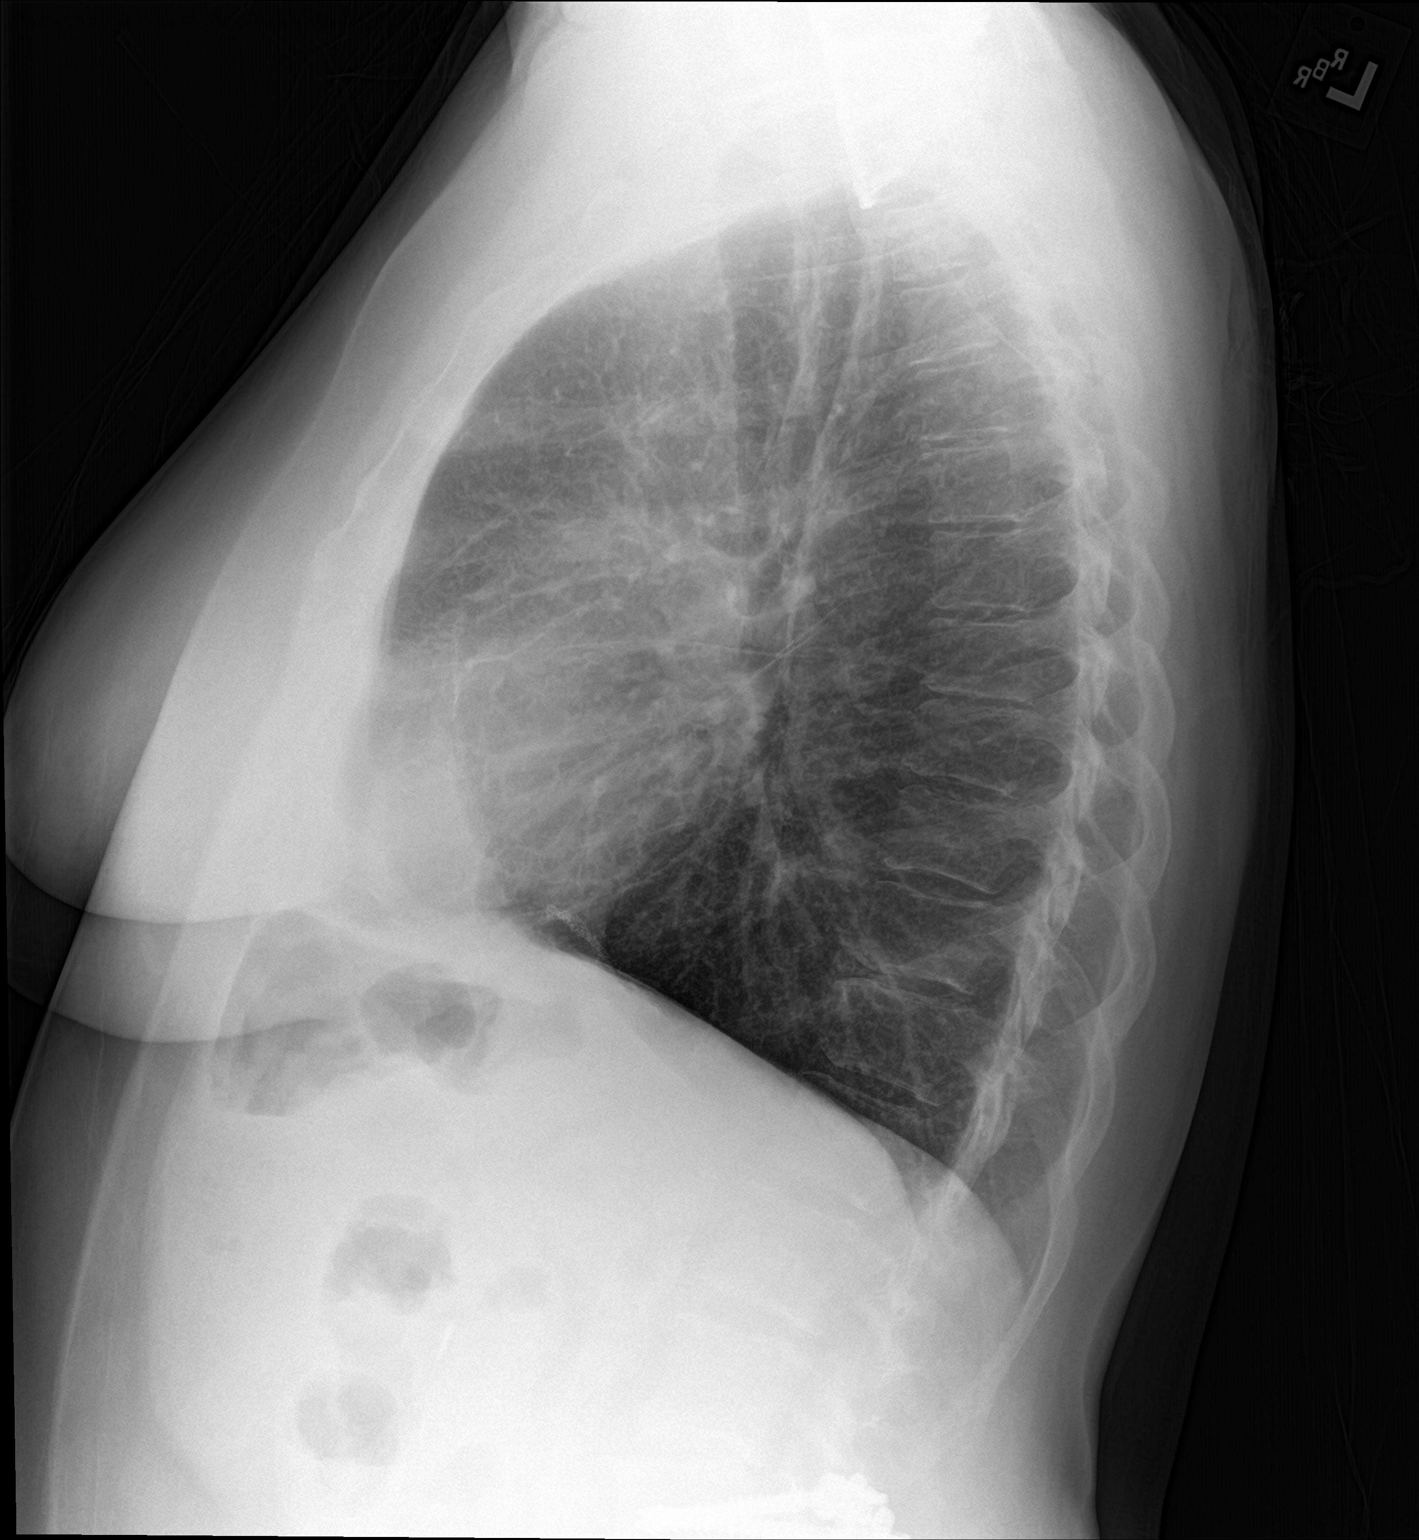

[2 of 2 positions shown; findings below may reference images not displayed]

FINDINGS: Heart size is normal. There is aortic atherosclerosis. There is
pulmonary scarring bilaterally. Surgical changes are seen in the
left lower lung. No sign of active infiltrate, mass, effusion or
collapse. No acute bone finding.
IMPRESSION: No active disease. Aortic atherosclerosis. Chronic pulmonary
scarring. Previous surgical changes in the left lung.

## 2018-09-19 DIAGNOSIS — I10 Essential (primary) hypertension: Secondary | ICD-10-CM | POA: Diagnosis not present

## 2018-09-19 DIAGNOSIS — E785 Hyperlipidemia, unspecified: Secondary | ICD-10-CM | POA: Diagnosis not present

## 2018-09-19 DIAGNOSIS — J449 Chronic obstructive pulmonary disease, unspecified: Secondary | ICD-10-CM | POA: Diagnosis not present

## 2018-09-19 DIAGNOSIS — E559 Vitamin D deficiency, unspecified: Secondary | ICD-10-CM | POA: Diagnosis not present

## 2018-10-06 DIAGNOSIS — J449 Chronic obstructive pulmonary disease, unspecified: Secondary | ICD-10-CM | POA: Diagnosis not present

## 2018-10-13 DIAGNOSIS — Z79899 Other long term (current) drug therapy: Secondary | ICD-10-CM | POA: Diagnosis not present

## 2018-10-13 DIAGNOSIS — Z79891 Long term (current) use of opiate analgesic: Secondary | ICD-10-CM | POA: Diagnosis not present

## 2018-10-13 DIAGNOSIS — M542 Cervicalgia: Secondary | ICD-10-CM | POA: Diagnosis not present

## 2018-10-13 DIAGNOSIS — M5106 Intervertebral disc disorders with myelopathy, lumbar region: Secondary | ICD-10-CM | POA: Diagnosis not present

## 2018-10-13 DIAGNOSIS — M4326 Fusion of spine, lumbar region: Secondary | ICD-10-CM | POA: Diagnosis not present

## 2018-10-13 DIAGNOSIS — M545 Low back pain: Secondary | ICD-10-CM | POA: Diagnosis not present

## 2018-10-13 DIAGNOSIS — G894 Chronic pain syndrome: Secondary | ICD-10-CM | POA: Diagnosis not present

## 2018-10-13 DIAGNOSIS — M549 Dorsalgia, unspecified: Secondary | ICD-10-CM | POA: Diagnosis not present

## 2018-10-17 ENCOUNTER — Institutional Professional Consult (permissible substitution): Payer: Medicare Other | Admitting: Emergency Medicine

## 2018-10-19 DIAGNOSIS — M1712 Unilateral primary osteoarthritis, left knee: Secondary | ICD-10-CM | POA: Diagnosis not present

## 2018-11-16 ENCOUNTER — Encounter: Payer: Self-pay | Admitting: Emergency Medicine

## 2018-11-21 ENCOUNTER — Institutional Professional Consult (permissible substitution): Payer: Medicare Other | Admitting: Emergency Medicine

## 2018-11-24 DIAGNOSIS — M545 Low back pain: Secondary | ICD-10-CM | POA: Diagnosis not present

## 2018-11-24 DIAGNOSIS — G894 Chronic pain syndrome: Secondary | ICD-10-CM | POA: Diagnosis not present

## 2018-11-24 DIAGNOSIS — M4326 Fusion of spine, lumbar region: Secondary | ICD-10-CM | POA: Diagnosis not present

## 2018-11-24 DIAGNOSIS — M5106 Intervertebral disc disorders with myelopathy, lumbar region: Secondary | ICD-10-CM | POA: Diagnosis not present

## 2018-12-09 DIAGNOSIS — L84 Corns and callosities: Secondary | ICD-10-CM | POA: Diagnosis not present

## 2018-12-09 DIAGNOSIS — J309 Allergic rhinitis, unspecified: Secondary | ICD-10-CM | POA: Diagnosis not present

## 2018-12-09 DIAGNOSIS — E119 Type 2 diabetes mellitus without complications: Secondary | ICD-10-CM | POA: Diagnosis not present

## 2018-12-12 DIAGNOSIS — M79676 Pain in unspecified toe(s): Secondary | ICD-10-CM | POA: Diagnosis not present

## 2018-12-12 DIAGNOSIS — L84 Corns and callosities: Secondary | ICD-10-CM | POA: Diagnosis not present

## 2018-12-12 DIAGNOSIS — L03031 Cellulitis of right toe: Secondary | ICD-10-CM | POA: Diagnosis not present

## 2018-12-15 DIAGNOSIS — E782 Mixed hyperlipidemia: Secondary | ICD-10-CM | POA: Diagnosis not present

## 2018-12-15 DIAGNOSIS — L84 Corns and callosities: Secondary | ICD-10-CM | POA: Diagnosis not present

## 2018-12-15 DIAGNOSIS — L03031 Cellulitis of right toe: Secondary | ICD-10-CM | POA: Diagnosis not present

## 2018-12-15 DIAGNOSIS — E559 Vitamin D deficiency, unspecified: Secondary | ICD-10-CM | POA: Diagnosis not present

## 2018-12-15 DIAGNOSIS — I10 Essential (primary) hypertension: Secondary | ICD-10-CM | POA: Diagnosis not present

## 2018-12-19 ENCOUNTER — Ambulatory Visit: Payer: Medicare Other | Admitting: Internal Medicine

## 2018-12-19 ENCOUNTER — Other Ambulatory Visit: Payer: Self-pay

## 2018-12-19 ENCOUNTER — Encounter: Payer: Self-pay | Admitting: Internal Medicine

## 2018-12-19 ENCOUNTER — Ambulatory Visit (INDEPENDENT_AMBULATORY_CARE_PROVIDER_SITE_OTHER): Payer: Medicare Other

## 2018-12-19 DIAGNOSIS — J449 Chronic obstructive pulmonary disease, unspecified: Secondary | ICD-10-CM

## 2018-12-19 DIAGNOSIS — F17210 Nicotine dependence, cigarettes, uncomplicated: Secondary | ICD-10-CM | POA: Diagnosis not present

## 2018-12-19 DIAGNOSIS — J219 Acute bronchiolitis, unspecified: Secondary | ICD-10-CM | POA: Diagnosis not present

## 2018-12-19 DIAGNOSIS — J84115 Respiratory bronchiolitis interstitial lung disease: Secondary | ICD-10-CM

## 2018-12-19 MED ORDER — BUDESONIDE-FORMOTEROL FUMARATE 80-4.5 MCG/ACT IN AERO: 2.0000 | INHALATION_SPRAY | Freq: Two times a day (BID) | RESPIRATORY_TRACT | 11 refills | Status: DC

## 2018-12-19 MED ORDER — BUDESONIDE-FORMOTEROL FUMARATE 80-4.5 MCG/ACT IN AERO: 2.0000 | INHALATION_SPRAY | Freq: Two times a day (BID) | RESPIRATORY_TRACT | 0 refills | Status: DC

## 2018-12-19 NOTE — Patient Instructions (Addendum)
Try symbicort 80 Take 2 puffs first thing in am and then another 2 puffs about 12 hours later x  2 weeks trial    Work on inhaler technique:  relax and gently blow all the way out then take a nice smooth deep breath back in, triggering the inhaler at same time you start breathing in.  Hold for up to 5 seconds if you can. Blow out thru nose. Rinse and gargle with water when done   The key is to stop smoking completely before smoking completely stops you!  For smoking cessation classes call [phone]      Please remember to go to the  x-ray department  for your tests - we will call you with the results when they are available      Please schedule a follow up office visit in 6 weeks, call sooner if needed with all medications /inhalers/ solutions in hand so we can verify exactly what you are taking. This includes all medications from all doctors and over the counters
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
## 2018-12-20 ENCOUNTER — Encounter: Payer: Self-pay | Admitting: Internal Medicine

## 2018-12-20 DIAGNOSIS — J449 Chronic obstructive pulmonary disease, unspecified: Secondary | ICD-10-CM

## 2018-12-20 HISTORY — DX: Chronic obstructive pulmonary disease, unspecified: J44.9

## 2018-12-20 NOTE — Assessment & Plan Note (Addendum)
4-5 min discussion re active cigarette smoking in addition to office E&M  Ask about tobacco use:   Ongoing  Advise quitting   I emphasized that although we never turn away smokers from the pulmonary clinic, we do ask that they understand that the recommendations that we make  won't work nearly as well in the presence of continued cigarette exposure. In fact, we may very well  reach a point where we can't promise to help the patient if he/she can't quit smoking. (We can and will promise to try to help, we just can't promise what we recommend will really work)  Assess willingness:  Not committed at this point. Has tried chantix in past  Assist in quit attempt:  Ok to try nicotine patches and f/u Per PCP when ready Arrange follow up:   Follow up per Primary Care planned

## 2018-12-20 NOTE — Assessment & Plan Note (Signed)
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  Unequivocally is it related/caused but smoking so not sure how she got the idea from previous ov's that it wasn't. rx is stop smoking and no other measures are effective (see separate a/p re smoking)   Will need walking sats/ pfts on return.

## 2018-12-20 NOTE — Assessment & Plan Note (Signed)
Active smoker - 12/19/2018  After extensive coaching inhaler device,  effectiveness =    75% from a baseline of 25% so try symb 80 2bid x 2 weeks and return for pfts in 6 weeks   Not clear really how much copd present, previously felt to be moderate by Dr Gwenyth Allegra with symptoms presently more c/w Group B so lama/laba might be a better choice but since already familiar with symb suggested she try taking it correctly/ consistently x 2 weeks then fill if helping, stop it if not helping and return for full pfts

## 2018-12-20 NOTE — Progress Notes (Signed)
ATC, line busy

## 2018-12-22 NOTE — Progress Notes (Signed)
ATC, Line busy

## 2018-12-22 NOTE — Progress Notes (Signed)
Line busy

## 2018-12-23 NOTE — Progress Notes (Signed)
Spoke with pt and notified of results per Dr. Wert. Pt verbalized understanding and denied any questions. 

## 2018-12-27 DIAGNOSIS — M4326 Fusion of spine, lumbar region: Secondary | ICD-10-CM | POA: Diagnosis not present

## 2018-12-27 DIAGNOSIS — G894 Chronic pain syndrome: Secondary | ICD-10-CM | POA: Diagnosis not present

## 2018-12-27 DIAGNOSIS — M5106 Intervertebral disc disorders with myelopathy, lumbar region: Secondary | ICD-10-CM | POA: Diagnosis not present

## 2018-12-28 DIAGNOSIS — E1142 Type 2 diabetes mellitus with diabetic polyneuropathy: Secondary | ICD-10-CM | POA: Diagnosis not present

## 2018-12-28 DIAGNOSIS — L03115 Cellulitis of right lower limb: Secondary | ICD-10-CM | POA: Diagnosis not present

## 2018-12-28 DIAGNOSIS — M2041 Other hammer toe(s) (acquired), right foot: Secondary | ICD-10-CM | POA: Diagnosis not present

## 2019-01-23 DIAGNOSIS — M5106 Intervertebral disc disorders with myelopathy, lumbar region: Secondary | ICD-10-CM | POA: Diagnosis not present

## 2019-01-23 DIAGNOSIS — M542 Cervicalgia: Secondary | ICD-10-CM | POA: Diagnosis not present

## 2019-01-23 DIAGNOSIS — G894 Chronic pain syndrome: Secondary | ICD-10-CM | POA: Diagnosis not present

## 2019-01-23 DIAGNOSIS — M545 Low back pain: Secondary | ICD-10-CM | POA: Diagnosis not present

## 2019-01-30 ENCOUNTER — Ambulatory Visit: Payer: Medicare Other | Admitting: Internal Medicine

## 2019-01-30 DIAGNOSIS — Z1231 Encounter for screening mammogram for malignant neoplasm of breast: Secondary | ICD-10-CM | POA: Diagnosis not present

## 2019-01-30 DIAGNOSIS — E782 Mixed hyperlipidemia: Secondary | ICD-10-CM | POA: Diagnosis not present

## 2019-01-30 DIAGNOSIS — I10 Essential (primary) hypertension: Secondary | ICD-10-CM | POA: Diagnosis not present

## 2019-01-30 DIAGNOSIS — M8580 Other specified disorders of bone density and structure, unspecified site: Secondary | ICD-10-CM | POA: Diagnosis not present

## 2019-02-14 DIAGNOSIS — M545 Low back pain: Secondary | ICD-10-CM | POA: Diagnosis not present

## 2019-02-14 DIAGNOSIS — G894 Chronic pain syndrome: Secondary | ICD-10-CM | POA: Diagnosis not present

## 2019-02-14 DIAGNOSIS — M5106 Intervertebral disc disorders with myelopathy, lumbar region: Secondary | ICD-10-CM | POA: Diagnosis not present

## 2019-02-20 DIAGNOSIS — I82401 Acute embolism and thrombosis of unspecified deep veins of right lower extremity: Secondary | ICD-10-CM | POA: Diagnosis not present

## 2019-02-20 DIAGNOSIS — I739 Peripheral vascular disease, unspecified: Secondary | ICD-10-CM | POA: Diagnosis not present

## 2019-02-20 DIAGNOSIS — I2699 Other pulmonary embolism without acute cor pulmonale: Secondary | ICD-10-CM | POA: Diagnosis not present

## 2019-02-20 DIAGNOSIS — I6522 Occlusion and stenosis of left carotid artery: Secondary | ICD-10-CM | POA: Diagnosis not present

## 2019-02-22 DIAGNOSIS — G4711 Idiopathic hypersomnia with long sleep time: Secondary | ICD-10-CM | POA: Diagnosis not present

## 2019-02-23 ENCOUNTER — Other Ambulatory Visit: Payer: Self-pay

## 2019-02-23 NOTE — Patient Outreach (Signed)
[facility]) Care Management  02/23/2019  BILLIEJO SORTO Dec 31, 1955 [id number]   Medication Adherence call to Mrs. Kingston Compliant Voice message left with a call back number. Mrs. Zimny is showing past due on Atorvastatin 40 mg under [place].   [place] Management Direct Dial [phone]  Fax [phone] [email or web address]

## 2019-02-24 ENCOUNTER — Encounter: Payer: Self-pay | Admitting: Gastroenterology

## 2019-02-27 DIAGNOSIS — E782 Mixed hyperlipidemia: Secondary | ICD-10-CM | POA: Diagnosis not present

## 2019-02-27 DIAGNOSIS — Z79899 Other long term (current) drug therapy: Secondary | ICD-10-CM | POA: Diagnosis not present

## 2019-02-27 DIAGNOSIS — E119 Type 2 diabetes mellitus without complications: Secondary | ICD-10-CM | POA: Diagnosis not present

## 2019-02-27 DIAGNOSIS — I739 Peripheral vascular disease, unspecified: Secondary | ICD-10-CM | POA: Diagnosis not present

## 2019-02-27 DIAGNOSIS — E559 Vitamin D deficiency, unspecified: Secondary | ICD-10-CM | POA: Diagnosis not present

## 2019-02-28 ENCOUNTER — Other Ambulatory Visit: Payer: Self-pay

## 2019-02-28 ENCOUNTER — Encounter: Payer: Self-pay | Admitting: Internal Medicine

## 2019-02-28 ENCOUNTER — Ambulatory Visit (INDEPENDENT_AMBULATORY_CARE_PROVIDER_SITE_OTHER): Payer: Medicare Other | Admitting: Internal Medicine

## 2019-02-28 VITALS — BP 134/78 | HR 83 | Temp 98.3°F | Ht 62.0 in | Wt 174.0 lb

## 2019-02-28 DIAGNOSIS — J449 Chronic obstructive pulmonary disease, unspecified: Secondary | ICD-10-CM

## 2019-02-28 DIAGNOSIS — F17210 Nicotine dependence, cigarettes, uncomplicated: Secondary | ICD-10-CM | POA: Diagnosis not present

## 2019-02-28 DIAGNOSIS — J84115 Respiratory bronchiolitis interstitial lung disease: Secondary | ICD-10-CM | POA: Diagnosis not present

## 2019-02-28 MED ORDER — BUDESONIDE-FORMOTEROL FUMARATE 160-4.5 MCG/ACT IN AERO: 2.0000 | INHALATION_SPRAY | Freq: Two times a day (BID) | RESPIRATORY_TRACT | 0 refills | Status: DC
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
## 2019-02-28 NOTE — Patient Instructions (Signed)
Plan A = Automatic = Symbicort 160 Take 2 puffs first thing in am and then another 2 puffs about 12 hours later.  If not convinced better after a full week then ok to use up 2 pffs every 12 hours if needed   Work on inhaler technique:  relax and gently blow all the way out then take a nice smooth deep breath back in, triggering the inhaler at same time you start breathing in.  Hold for up to 5 seconds if you can. Blow out thru nose. Rinse and gargle with water when done     Plan B = Backup Only use your albuterol inhaler as a rescue medication to be used if you can't catch your breath by resting or doing a relaxed purse lip breathing pattern.  - The less you use it, the better it will work when you need it. - Ok to use the inhaler up to 2 puffs  every 4 hours if you must but call for appointment if use goes up over your usual need - Don't leave home without it !!  (think of it like the spare tire for your car)   Plan C = Crisis - only use your albuterol nebulizer if you first try Plan B and it fails to help > ok to use the nebulizer up to every 4 hours but if start needing it regularly call for immediate appointment   Please schedule a follow up office visit in 6 weeks, call sooner if needed with all medications /inhalers/ solutions in hand so we can verify exactly what you are taking. This includes all medications from all doctors and over the counters  - PFTs on return if possible

## 2019-02-28 NOTE — Assessment & Plan Note (Signed)
Active smoker  -Vats bx dx by Arlyce Dice 09/07/2008 / dx by Dian Situ = RBILD  Was supposed to get pfts/ walking sats on return > will reschedule

## 2019-02-28 NOTE — Assessment & Plan Note (Signed)
Active smoker - 12/19/2018   Try symb 80 > ? Ever took it correctly  - 02/28/2019  After extensive coaching inhaler device,  effectiveness =    75% > try symb 160 2bid x one week and if can't tell better then just use prn for flares pending f/u pfts   I don't believe she has severe copd at all and needs to return for pfts to sort out - in meantime should at least try the symbicot 160 full dose for a week to get the advantage of both components  see avs for instructions unique to this ov

## 2019-03-16 ENCOUNTER — Other Ambulatory Visit: Payer: Self-pay

## 2019-03-16 NOTE — Patient Outreach (Signed)
[facility]) Care Management  03/16/2019  Barbara French October 16, 1955 [id number]   Medication Adherence call to Barbara French Compliant Voice message left with a call back number. Barbara French is showing past due on Atorvastatin 40 mg under [place].   [facility] Management Direct Dial [phone]  Fax [phone] [email or web address]

## 2019-03-20 ENCOUNTER — Encounter: Payer: Self-pay | Admitting: Gastroenterology

## 2019-03-20 ENCOUNTER — Ambulatory Visit (INDEPENDENT_AMBULATORY_CARE_PROVIDER_SITE_OTHER): Payer: Medicare Other | Admitting: Gastroenterology

## 2019-03-20 ENCOUNTER — Other Ambulatory Visit: Payer: Self-pay

## 2019-03-20 ENCOUNTER — Telehealth: Payer: Self-pay

## 2019-03-20 VITALS — BP 146/78 | HR 96 | Temp 98.5°F | Ht 61.0 in | Wt 180.1 lb

## 2019-03-20 DIAGNOSIS — K219 Gastro-esophageal reflux disease without esophagitis: Secondary | ICD-10-CM | POA: Diagnosis not present

## 2019-03-20 DIAGNOSIS — Z800 Family history of malignant neoplasm of digestive organs: Secondary | ICD-10-CM | POA: Diagnosis not present

## 2019-03-20 DIAGNOSIS — R1310 Dysphagia, unspecified: Secondary | ICD-10-CM

## 2019-03-20 MED ORDER — SUPREP BOWEL PREP KIT 17.5-3.13-1.6 GM/177ML PO SOLN: 1.0000 | ORAL | 0 refills | Status: DC

## 2019-03-20 MED ORDER — HYDROCORTISONE (PERIANAL) 2.5 % EX CREA: 1.0000 "application " | TOPICAL_CREAM | Freq: Two times a day (BID) | CUTANEOUS | 1 refills | Status: AC
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
## 2019-03-20 NOTE — Telephone Encounter (Signed)
Please comment on eliquis. 

## 2019-03-20 NOTE — Telephone Encounter (Signed)
[facility] Pre-operative Risk Assessment     Request for surgical clearance:     Endoscopy Procedure  What type of surgery is being performed?     EGD/Colon  When is this surgery scheduled?     05/01/19  What type of clearance is required ?   Pharmacy  Are there any medications that need to be held prior to surgery and how long? Eliquis   Practice name and name of physician performing surgery?      [facility]  What is your office phone and fax number?      Phone- [phone]  [phone]  Anesthesia type (None, local, MAC, general) ?       MAC

## 2019-03-20 NOTE — Patient Instructions (Addendum)
If you are age 61 or older, your body mass index should be between 23-30. Your Body mass index is 34.03 kg/m. If this is out of the aforementioned range listed, please consider follow up with your Primary Care Provider.  If you are age 73 or younger, your body mass index should be between 19-25. Your Body mass index is 34.03 kg/m. If this is out of the aformentioned range listed, please consider follow up with your Primary Care Provider.   Stop smoking.  You will be contacted by our office prior to your procedure for directions on holding your Eliquis.  If you do not hear from our office 1 week prior to your scheduled procedure, please call [phone] to discuss.   We have sent the following medications to your pharmacy for you to pick up at your convenience: Anusol  You will be contacted by our office with a date and time for your EGD and Colonoscopy.   Please go to the lab at [facility] ([place] You will need to go to level "B", you do not need an appointment for this. Hours available are 7:30 am - 4:30 pm.   We have given you samples of the following medication to take: Suprep  Thank you,  Dr. Jackquline Denmark

## 2019-03-20 NOTE — Telephone Encounter (Signed)
Covid-19 screening questions   Do you now or have you had a fever in the last 14 days? No  Do you have any respiratory symptoms of shortness of breath or cough now or in the last 14 days? Yes due to bronchitis  Do you have any family members or close contacts with diagnosed or suspected Covid-19 in the past 14 days? No   Have you been tested for Covid-19 and found to be positive? No

## 2019-03-21 DIAGNOSIS — M5106 Intervertebral disc disorders with myelopathy, lumbar region: Secondary | ICD-10-CM | POA: Diagnosis not present

## 2019-03-21 DIAGNOSIS — M79601 Pain in right arm: Secondary | ICD-10-CM | POA: Diagnosis not present

## 2019-03-21 DIAGNOSIS — G894 Chronic pain syndrome: Secondary | ICD-10-CM | POA: Diagnosis not present

## 2019-03-21 DIAGNOSIS — M545 Low back pain: Secondary | ICD-10-CM | POA: Diagnosis not present

## 2019-03-21 NOTE — Addendum Note (Signed)
Addended by: Karena Addison on: 03/21/2019 09:36 AM   Modules accepted: Orders, SmartSet

## 2019-03-21 NOTE — Telephone Encounter (Signed)
   Primary Cardiologist: Jenean Lindau, MD  Chart reviewed as part of pre-operative protocol coverage. We received a request to provide guidance on anticoagulation.   Per our clinical pharmacist: Patient is on Eliquis due to PE s/p knee surgery 3 years ago. She was recently seen by Dr. Maryjean Morn of [facility] who advised her to continue Eliquis for now and he would determine based off her updated vascular studies if long term anticoagulation would be needed. Since Dr. Maryjean Morn is managing Elqiuis, I will defer decision on holding anticoagulation for procedure to him.    I will route this recommendation to the requesting party via Epic fax function and remove from pre-op pool.  Please call with questions.  Tami Lin Hollin Crewe, PA 03/21/2019, 10:40 AM

## 2019-03-21 NOTE — Telephone Encounter (Signed)
Patient is on Eliquis due to PE s/p knee surgery 3 years ago. She was recently seen by Dr. Maryjean Morn of [facility] who advised her to continue Eliquis for now and he would determine based off her updated vascular studies if long term anticoagulation would be needed. Since Dr. Maryjean Morn is managing Elqiuis, I will defer decision on holding anticoagulation for procedure to him.

## 2019-03-24 ENCOUNTER — Telehealth: Payer: Self-pay | Admitting: Nurse Practitioner

## 2019-03-24 ENCOUNTER — Other Ambulatory Visit: Payer: Self-pay | Admitting: Orthopaedic Surgery

## 2019-03-24 DIAGNOSIS — G894 Chronic pain syndrome: Secondary | ICD-10-CM

## 2019-03-24 NOTE — Telephone Encounter (Signed)
Phone call to patient to verify medication list and allergies for myelogram procedure. Pt instructed to hold lexapro and adderall for 48hrs prior to myelogram appointment time. Pt aware she will also need to be off of Eliquis for atleast 48hrs prior to myelogram (pending approval from Dr. Nicholos Johns). Pt verbalized understanding. Pre and post procedure instructions reviewed with pt. Thinner hold request faxed to Dr. Rica Records, awaiting reply.

## 2019-03-30 ENCOUNTER — Ambulatory Visit: Payer: Medicare Other | Admitting: Cardiology

## 2019-03-30 ENCOUNTER — Telehealth: Payer: Self-pay | Admitting: Cardiology

## 2019-03-30 NOTE — Telephone Encounter (Signed)
I called patient and scheduled appointment for patient for 03/31/2019 and left numerous messages about appt date and time.  On Wedneday 03/29/2019 I left message that if I did not hear from her by 8:30 on 03/30/2019 I would cancel appointment due no response from patient. Cancelled at 10:20 am 03/30/2019.

## 2019-03-31 ENCOUNTER — Ambulatory Visit: Payer: Medicare Other | Admitting: Cardiology

## 2019-04-05 DIAGNOSIS — I739 Peripheral vascular disease, unspecified: Secondary | ICD-10-CM | POA: Diagnosis not present

## 2019-04-05 DIAGNOSIS — I6522 Occlusion and stenosis of left carotid artery: Secondary | ICD-10-CM | POA: Diagnosis not present

## 2019-04-14 ENCOUNTER — Ambulatory Visit: Payer: Medicare Other | Admitting: Internal Medicine

## 2019-04-20 DIAGNOSIS — Z79899 Other long term (current) drug therapy: Secondary | ICD-10-CM | POA: Diagnosis not present

## 2019-04-20 DIAGNOSIS — M542 Cervicalgia: Secondary | ICD-10-CM | POA: Diagnosis not present

## 2019-04-20 DIAGNOSIS — Z79891 Long term (current) use of opiate analgesic: Secondary | ICD-10-CM | POA: Diagnosis not present

## 2019-04-20 DIAGNOSIS — M5106 Intervertebral disc disorders with myelopathy, lumbar region: Secondary | ICD-10-CM | POA: Diagnosis not present

## 2019-04-20 DIAGNOSIS — G894 Chronic pain syndrome: Secondary | ICD-10-CM | POA: Diagnosis not present

## 2019-04-20 DIAGNOSIS — M545 Low back pain: Secondary | ICD-10-CM | POA: Diagnosis not present

## 2019-04-21 DIAGNOSIS — M85852 Other specified disorders of bone density and structure, left thigh: Secondary | ICD-10-CM | POA: Diagnosis not present

## 2019-04-21 DIAGNOSIS — M8589 Other specified disorders of bone density and structure, multiple sites: Secondary | ICD-10-CM | POA: Diagnosis not present

## 2019-04-21 DIAGNOSIS — Z1231 Encounter for screening mammogram for malignant neoplasm of breast: Secondary | ICD-10-CM | POA: Diagnosis not present

## 2019-04-25 ENCOUNTER — Ambulatory Visit: Payer: Medicare Other | Admitting: Cardiology
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
## 2019-04-26 ENCOUNTER — Other Ambulatory Visit: Payer: Self-pay

## 2019-04-26 ENCOUNTER — Encounter (HOSPITAL_COMMUNITY): Payer: Self-pay | Admitting: *Deleted

## 2019-04-27 ENCOUNTER — Other Ambulatory Visit (HOSPITAL_COMMUNITY)
Admission: RE | Admit: 2019-04-27 | Discharge: 2019-04-27 | Disposition: A | Discharge status: Home / Self Care | Payer: Medicare Other | Source: Ambulatory Visit | Attending: Gastroenterology | Admitting: Gastroenterology

## 2019-04-27 DIAGNOSIS — Z01812 Encounter for preprocedural laboratory examination: Secondary | ICD-10-CM | POA: Diagnosis not present

## 2019-04-27 DIAGNOSIS — Z20828 Contact with and (suspected) exposure to other viral communicable diseases: Secondary | ICD-10-CM | POA: Insufficient documentation

## 2019-04-28 DIAGNOSIS — I739 Peripheral vascular disease, unspecified: Secondary | ICD-10-CM | POA: Diagnosis not present

## 2019-04-28 DIAGNOSIS — I70223 Atherosclerosis of native arteries of extremities with rest pain, bilateral legs: Secondary | ICD-10-CM | POA: Diagnosis not present

## 2019-04-28 LAB — NOVEL CORONAVIRUS, NAA (HOSP ORDER, SEND-OUT TO REF LAB; TAT 18-24 HRS): SARS-CoV-2, NAA: NOT DETECTED

## 2019-04-30 ENCOUNTER — Encounter (HOSPITAL_COMMUNITY): Payer: Self-pay

## 2019-05-01 ENCOUNTER — Ambulatory Visit (HOSPITAL_COMMUNITY): Admission: RE | Admit: 2019-05-01 | Payer: Medicare Other | Source: Home / Self Care | Admitting: Gastroenterology

## 2019-05-01 HISTORY — DX: Other cervical disc degeneration, unspecified cervical region: M50.30

## 2019-05-01 HISTORY — DX: Other intervertebral disc degeneration, lumbar region: M51.36

## 2019-05-01 SURGERY — COLONOSCOPY WITH PROPOFOL
Anesthesia: Monitor Anesthesia Care

## 2019-05-01 MED ORDER — PROPOFOL 10 MG/ML IV BOLUS
INTRAVENOUS | Status: AC
  Filled 2019-05-01: qty 40

## 2019-05-01 NOTE — Progress Notes (Signed)
Pt called at 0710 and stated she had developed a sore throat and cough yesterday and was going to cancel her procedure today. I told her she would need to call Dr. Steve Rattler office to reschedule.

## 2019-05-03 ENCOUNTER — Ambulatory Visit (INDEPENDENT_AMBULATORY_CARE_PROVIDER_SITE_OTHER): Payer: Medicare Other | Admitting: Cardiology

## 2019-05-03 ENCOUNTER — Encounter: Payer: Self-pay | Admitting: Cardiology

## 2019-05-03 ENCOUNTER — Other Ambulatory Visit: Payer: Self-pay

## 2019-05-03 VITALS — BP 128/64 | HR 82 | Ht 61.0 in | Wt 182.0 lb

## 2019-05-03 DIAGNOSIS — I6523 Occlusion and stenosis of bilateral carotid arteries: Secondary | ICD-10-CM

## 2019-05-03 DIAGNOSIS — I70213 Atherosclerosis of native arteries of extremities with intermittent claudication, bilateral legs: Secondary | ICD-10-CM

## 2019-05-03 DIAGNOSIS — I10 Essential (primary) hypertension: Secondary | ICD-10-CM

## 2019-05-03 DIAGNOSIS — I25119 Atherosclerotic heart disease of native coronary artery with unspecified angina pectoris: Secondary | ICD-10-CM
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
## 2019-05-03 NOTE — Patient Instructions (Signed)
Medication Instructions:  Your physician recommends that you continue on your current medications as directed. Please refer to the Current Medication list given to you today.  If you need a refill on your cardiac medications before your next appointment, please call your pharmacy.   Lab work: none If you have labs (blood work) drawn today and your tests are completely normal, you will receive your results only by: [name] Kitchen MyChart Message (if you have MyChart) OR . A paper copy in the mail If you have any lab test that is abnormal or we need to change your treatment, we will call you to review the results.  Testing/Procedures: nOne  Follow-Up: At [facility], you and your health needs are our priority.  As part of our continuing mission to provide you with exceptional heart care, we have created designated Provider Care Teams.  These Care Teams include your primary Cardiologist (physician) and Advanced Practice Providers (APPs -  Physician Assistants and Nurse Practitioners) who all work together to provide you with the care you need, when you need it. You will need a follow up appointment in 6 months.  Please call our office 2 months in advance to schedule this appointment.  You may see Jenean Lindau, MD  Any Other Special Instructions Will Be Listed Below (If Applicable).

## 2019-05-05 ENCOUNTER — Telehealth: Payer: Self-pay | Admitting: Cardiology

## 2019-05-05 DIAGNOSIS — I739 Peripheral vascular disease, unspecified: Secondary | ICD-10-CM | POA: Diagnosis not present

## 2019-05-05 DIAGNOSIS — I70223 Atherosclerosis of native arteries of extremities with rest pain, bilateral legs: Secondary | ICD-10-CM | POA: Diagnosis not present

## 2019-05-05 NOTE — Telephone Encounter (Signed)
Please fax to Dr Maryjean Morn at [phone]

## 2019-05-05 NOTE — Telephone Encounter (Signed)
Telephone call to patient. Informed her Dr Maryjean Morn needs to send a clearance form to [phone] and we will have Dr Agustin Cree (DOD) review the clearance.Patient verbalized understanding.

## 2019-05-05 NOTE — Telephone Encounter (Signed)
Faxed Dr Harriet Masson note that clears patient for surgery to [phone] and [phone]. Received successful confirmation sheet.

## 2019-05-05 NOTE — Telephone Encounter (Signed)
Patient called and states that she is having a vascular surgery on Monday by Dr. Maryjean Morn and she states she forgot to mention that to Dr Harriet Masson and can she be cleared for that procedure on Monday...[name] KitchenMarland Kitchen

## 2019-05-08 DIAGNOSIS — Z96651 Presence of right artificial knee joint: Secondary | ICD-10-CM | POA: Diagnosis not present

## 2019-05-08 DIAGNOSIS — I70212 Atherosclerosis of native arteries of extremities with intermittent claudication, left leg: Secondary | ICD-10-CM | POA: Diagnosis not present

## 2019-05-08 DIAGNOSIS — Z95820 Peripheral vascular angioplasty status with implants and grafts: Secondary | ICD-10-CM | POA: Diagnosis not present

## 2019-05-08 DIAGNOSIS — I739 Peripheral vascular disease, unspecified: Secondary | ICD-10-CM | POA: Insufficient documentation

## 2019-05-08 DIAGNOSIS — I2510 Atherosclerotic heart disease of native coronary artery without angina pectoris: Secondary | ICD-10-CM | POA: Diagnosis not present

## 2019-05-08 DIAGNOSIS — I70223 Atherosclerosis of native arteries of extremities with rest pain, bilateral legs: Secondary | ICD-10-CM | POA: Diagnosis not present

## 2019-05-08 DIAGNOSIS — I70203 Unspecified atherosclerosis of native arteries of extremities, bilateral legs: Secondary | ICD-10-CM | POA: Diagnosis not present

## 2019-05-08 DIAGNOSIS — Z86711 Personal history of pulmonary embolism: Secondary | ICD-10-CM | POA: Diagnosis not present

## 2019-05-08 HISTORY — DX: Peripheral vascular disease, unspecified: I73.9

## 2019-05-15 DIAGNOSIS — I2510 Atherosclerotic heart disease of native coronary artery without angina pectoris: Secondary | ICD-10-CM | POA: Diagnosis not present

## 2019-05-15 DIAGNOSIS — I739 Peripheral vascular disease, unspecified: Secondary | ICD-10-CM | POA: Diagnosis not present

## 2019-05-15 DIAGNOSIS — I6522 Occlusion and stenosis of left carotid artery: Secondary | ICD-10-CM | POA: Diagnosis not present

## 2019-05-22 ENCOUNTER — Other Ambulatory Visit: Payer: Self-pay | Admitting: Orthopaedic Surgery

## 2019-05-22 DIAGNOSIS — Z79899 Other long term (current) drug therapy: Secondary | ICD-10-CM | POA: Diagnosis not present

## 2019-05-22 DIAGNOSIS — I739 Peripheral vascular disease, unspecified: Secondary | ICD-10-CM | POA: Diagnosis not present

## 2019-05-22 DIAGNOSIS — M545 Low back pain: Secondary | ICD-10-CM | POA: Diagnosis not present

## 2019-05-22 DIAGNOSIS — I824Z1 Acute embolism and thrombosis of unspecified deep veins of right distal lower extremity: Secondary | ICD-10-CM

## 2019-05-22 DIAGNOSIS — I10 Essential (primary) hypertension: Secondary | ICD-10-CM | POA: Diagnosis not present

## 2019-05-22 DIAGNOSIS — G894 Chronic pain syndrome: Secondary | ICD-10-CM | POA: Diagnosis not present

## 2019-05-22 DIAGNOSIS — E782 Mixed hyperlipidemia: Secondary | ICD-10-CM | POA: Diagnosis not present

## 2019-05-22 DIAGNOSIS — E119 Type 2 diabetes mellitus without complications: Secondary | ICD-10-CM | POA: Diagnosis not present

## 2019-05-22 DIAGNOSIS — M542 Cervicalgia: Secondary | ICD-10-CM | POA: Diagnosis not present

## 2019-05-22 DIAGNOSIS — M5106 Intervertebral disc disorders with myelopathy, lumbar region: Secondary | ICD-10-CM | POA: Diagnosis not present

## 2019-05-23 ENCOUNTER — Other Ambulatory Visit: Payer: Medicare Other

## 2019-05-23 ENCOUNTER — Ambulatory Visit
Admission: RE | Admit: 2019-05-23 | Discharge: 2019-05-23 | Disposition: A | Discharge status: Home / Self Care | Payer: Medicare Other | Source: Ambulatory Visit | Attending: Orthopaedic Surgery | Admitting: Orthopaedic Surgery

## 2019-05-23 DIAGNOSIS — R600 Localized edema: Secondary | ICD-10-CM | POA: Diagnosis not present

## 2019-05-23 DIAGNOSIS — I824Z1 Acute embolism and thrombosis of unspecified deep veins of right distal lower extremity: Secondary | ICD-10-CM

## 2019-05-24 DIAGNOSIS — Z9181 History of falling: Secondary | ICD-10-CM | POA: Diagnosis not present

## 2019-05-24 DIAGNOSIS — E785 Hyperlipidemia, unspecified: Secondary | ICD-10-CM | POA: Diagnosis not present

## 2019-05-24 DIAGNOSIS — Z0000 Encounter for general adult medical examination without abnormal findings: Secondary | ICD-10-CM | POA: Diagnosis not present

## 2019-05-29 ENCOUNTER — Inpatient Hospital Stay
Admission: RE | Admit: 2019-05-29 | Discharge: 2019-05-29 | Disposition: A | Discharge status: Home / Self Care | Payer: Medicare Other | Source: Ambulatory Visit | Attending: Orthopaedic Surgery | Admitting: Orthopaedic Surgery

## 2019-05-29 ENCOUNTER — Other Ambulatory Visit: Payer: Self-pay

## 2019-05-29 ENCOUNTER — Other Ambulatory Visit: Payer: Medicare Other

## 2019-05-29 NOTE — Discharge Instructions (Signed)
Myelogram Discharge Instructions  1. Go home and rest quietly for the next 24 hours.  It is important to lie flat for the next 24 hours.  Get up only to go to the restroom.  You may lie in the bed or on a couch on your back, your stomach, your left side or your right side.  You may have one pillow under your head.  You may have pillows between your knees while you are on your side or under your knees while you are on your back.  2. DO NOT drive today.  Recline the seat as far back as it will go, while still wearing your seat belt, on the way home.  3. You may get up to go to the bathroom as needed.  You may sit up for 10 minutes to eat.  You may resume your normal diet and medications unless otherwise indicated.  Drink plenty of extra fluids today and tomorrow.  4. The incidence of a spinal headache with nausea and/or vomiting is about 5% (one in 20 patients).  If you develop a headache, lie flat and drink plenty of fluids until the headache goes away.  Caffeinated beverages may be helpful.  If you develop severe nausea and vomiting or a headache that does not go away with flat bed rest, call [phone].  5. You may resume normal activities after your 24 hours of bed rest is over; however, do not exert yourself strongly or do any heavy lifting tomorrow.  6. Call your physician for a follow-up appointment.   You may resume Adderall and Lexapro on Tuesday, May 30, 2019 after 10:30a.m.

## 2019-05-30 DIAGNOSIS — I2510 Atherosclerotic heart disease of native coronary artery without angina pectoris: Secondary | ICD-10-CM | POA: Diagnosis not present

## 2019-05-30 DIAGNOSIS — I739 Peripheral vascular disease, unspecified: Secondary | ICD-10-CM | POA: Diagnosis not present

## 2019-06-06 DIAGNOSIS — I6523 Occlusion and stenosis of bilateral carotid arteries: Secondary | ICD-10-CM | POA: Diagnosis not present

## 2019-06-06 DIAGNOSIS — I739 Peripheral vascular disease, unspecified: Secondary | ICD-10-CM | POA: Diagnosis not present

## 2019-06-09 DIAGNOSIS — R519 Headache, unspecified: Secondary | ICD-10-CM | POA: Diagnosis not present

## 2019-06-09 DIAGNOSIS — N390 Urinary tract infection, site not specified: Secondary | ICD-10-CM | POA: Diagnosis not present

## 2019-06-20 DIAGNOSIS — M5106 Intervertebral disc disorders with myelopathy, lumbar region: Secondary | ICD-10-CM | POA: Diagnosis not present

## 2019-06-20 DIAGNOSIS — M542 Cervicalgia: Secondary | ICD-10-CM | POA: Diagnosis not present

## 2019-06-20 DIAGNOSIS — M545 Low back pain: Secondary | ICD-10-CM | POA: Diagnosis not present

## 2019-06-20 DIAGNOSIS — G894 Chronic pain syndrome: Secondary | ICD-10-CM | POA: Diagnosis not present

## 2019-08-21 DIAGNOSIS — M545 Low back pain: Secondary | ICD-10-CM | POA: Diagnosis not present

## 2019-08-21 DIAGNOSIS — M5106 Intervertebral disc disorders with myelopathy, lumbar region: Secondary | ICD-10-CM | POA: Diagnosis not present

## 2019-08-21 DIAGNOSIS — G894 Chronic pain syndrome: Secondary | ICD-10-CM | POA: Diagnosis not present

## 2019-08-22 DIAGNOSIS — I739 Peripheral vascular disease, unspecified: Secondary | ICD-10-CM | POA: Diagnosis not present

## 2019-08-22 DIAGNOSIS — E119 Type 2 diabetes mellitus without complications: Secondary | ICD-10-CM | POA: Diagnosis not present

## 2019-08-22 DIAGNOSIS — I10 Essential (primary) hypertension: Secondary | ICD-10-CM | POA: Diagnosis not present

## 2019-08-22 DIAGNOSIS — E559 Vitamin D deficiency, unspecified: Secondary | ICD-10-CM | POA: Diagnosis not present

## 2019-08-25 DIAGNOSIS — E559 Vitamin D deficiency, unspecified: Secondary | ICD-10-CM | POA: Diagnosis not present

## 2019-08-25 DIAGNOSIS — E782 Mixed hyperlipidemia: Secondary | ICD-10-CM | POA: Diagnosis not present

## 2019-08-25 DIAGNOSIS — E119 Type 2 diabetes mellitus without complications: Secondary | ICD-10-CM | POA: Diagnosis not present

## 2019-08-25 DIAGNOSIS — Z79899 Other long term (current) drug therapy: Secondary | ICD-10-CM | POA: Diagnosis not present

## 2019-09-18 DIAGNOSIS — M79671 Pain in right foot: Secondary | ICD-10-CM | POA: Diagnosis not present

## 2019-09-18 DIAGNOSIS — G894 Chronic pain syndrome: Secondary | ICD-10-CM | POA: Diagnosis not present

## 2019-09-18 DIAGNOSIS — M545 Low back pain: Secondary | ICD-10-CM | POA: Diagnosis not present

## 2019-09-18 DIAGNOSIS — M5106 Intervertebral disc disorders with myelopathy, lumbar region: Secondary | ICD-10-CM | POA: Diagnosis not present

## 2019-10-02 DIAGNOSIS — G4711 Idiopathic hypersomnia with long sleep time: Secondary | ICD-10-CM | POA: Diagnosis not present

## 2019-10-16 DIAGNOSIS — I739 Peripheral vascular disease, unspecified: Secondary | ICD-10-CM | POA: Diagnosis not present

## 2019-10-19 DIAGNOSIS — M545 Low back pain: Secondary | ICD-10-CM | POA: Diagnosis not present

## 2019-10-19 DIAGNOSIS — I10 Essential (primary) hypertension: Secondary | ICD-10-CM | POA: Diagnosis not present

## 2019-10-19 DIAGNOSIS — Z79899 Other long term (current) drug therapy: Secondary | ICD-10-CM | POA: Diagnosis not present

## 2019-10-19 DIAGNOSIS — M5106 Intervertebral disc disorders with myelopathy, lumbar region: Secondary | ICD-10-CM | POA: Diagnosis not present

## 2019-10-19 DIAGNOSIS — Z79891 Long term (current) use of opiate analgesic: Secondary | ICD-10-CM | POA: Diagnosis not present

## 2019-10-19 DIAGNOSIS — G894 Chronic pain syndrome: Secondary | ICD-10-CM | POA: Diagnosis not present

## 2019-11-03 DIAGNOSIS — J309 Allergic rhinitis, unspecified: Secondary | ICD-10-CM | POA: Diagnosis not present

## 2019-11-03 DIAGNOSIS — N760 Acute vaginitis: Secondary | ICD-10-CM | POA: Diagnosis not present

## 2019-11-03 DIAGNOSIS — J449 Chronic obstructive pulmonary disease, unspecified: Secondary | ICD-10-CM | POA: Diagnosis not present

## 2019-11-03 DIAGNOSIS — I10 Essential (primary) hypertension: Secondary | ICD-10-CM | POA: Diagnosis not present

## 2019-11-16 DIAGNOSIS — R509 Fever, unspecified: Secondary | ICD-10-CM | POA: Diagnosis not present

## 2019-11-16 DIAGNOSIS — R519 Headache, unspecified: Secondary | ICD-10-CM | POA: Diagnosis not present

## 2019-12-08 DIAGNOSIS — G894 Chronic pain syndrome: Secondary | ICD-10-CM | POA: Diagnosis not present

## 2019-12-08 DIAGNOSIS — M5136 Other intervertebral disc degeneration, lumbar region: Secondary | ICD-10-CM | POA: Diagnosis not present

## 2019-12-08 DIAGNOSIS — M47817 Spondylosis without myelopathy or radiculopathy, lumbosacral region: Secondary | ICD-10-CM | POA: Diagnosis not present

## 2019-12-18 DIAGNOSIS — M47816 Spondylosis without myelopathy or radiculopathy, lumbar region: Secondary | ICD-10-CM | POA: Diagnosis not present

## 2019-12-22 ENCOUNTER — Other Ambulatory Visit: Payer: Self-pay

## 2019-12-25 ENCOUNTER — Ambulatory Visit: Payer: Medicare Other | Admitting: Cardiology

## 2020-01-01 ENCOUNTER — Ambulatory Visit: Payer: Medicare Other | Admitting: Cardiology

## 2020-01-01 ENCOUNTER — Encounter: Payer: Self-pay | Admitting: Cardiology

## 2020-01-01 ENCOUNTER — Other Ambulatory Visit: Payer: Self-pay

## 2020-01-01 ENCOUNTER — Ambulatory Visit (INDEPENDENT_AMBULATORY_CARE_PROVIDER_SITE_OTHER): Payer: Medicare Other

## 2020-01-01 VITALS — BP 128/78 | HR 104 | Ht 61.0 in | Wt 179.0 lb

## 2020-01-01 DIAGNOSIS — Z9889 Other specified postprocedural states: Secondary | ICD-10-CM

## 2020-01-01 DIAGNOSIS — I25119 Atherosclerotic heart disease of native coronary artery with unspecified angina pectoris: Secondary | ICD-10-CM

## 2020-01-01 DIAGNOSIS — E088 Diabetes mellitus due to underlying condition with unspecified complications: Secondary | ICD-10-CM | POA: Diagnosis not present

## 2020-01-01 DIAGNOSIS — I10 Essential (primary) hypertension: Secondary | ICD-10-CM | POA: Diagnosis not present

## 2020-01-01 DIAGNOSIS — F17210 Nicotine dependence, cigarettes, uncomplicated: Secondary | ICD-10-CM

## 2020-01-01 DIAGNOSIS — R002 Palpitations: Secondary | ICD-10-CM

## 2020-01-01 NOTE — Patient Instructions (Signed)
Medication Instructions:  No medication changes. *If you need a refill on your cardiac medications before your next appointment, please call your pharmacy*   Lab Work: None ordered If you have labs (blood work) drawn today and your tests are completely normal, you will receive your results only by: [name] Kitchen MyChart Message (if you have MyChart) OR . A paper copy in the mail If you have any lab test that is abnormal or we need to change your treatment, we will call you to review the results.   Testing/Procedures:  WHY IS MY DOCTOR PRESCRIBING ZIO? The Zio system is proven and trusted by physicians to detect and diagnose irregular heart rhythms -- and has been prescribed to hundreds of thousands of patients.  The FDA has cleared the Zio system to monitor for many different kinds of irregular heart rhythms. In a study, physicians were able to reach a diagnosis 90% of the time with the Zio system1.  You can wear the Zio monitor -- a small, discreet, comfortable patch -- during your normal day-to-day activity, including while you sleep, shower, and exercise, while it records every single heartbeat for analysis.  1Barrett, P., et al. Comparison of 24 Hour Holter Monitoring Versus 14 Day Novel Adhesive Patch Electrocardiographic Monitoring. [place], 2014.  ZIO VS. HOLTER MONITORING The Zio monitor can be comfortably worn for up to 14 days. Holter monitors can be worn for 24 to 48 hours, limiting the time to record any irregular heart rhythms you may have. Zio is able to capture data for the 51% of patients who have their first symptom-triggered arrhythmia after 48 hours.1  LIVE WITHOUT RESTRICTIONS The Zio ambulatory cardiac monitor is a small, unobtrusive, and water-resistant patch--you might even forget you're wearing it. The Zio monitor records and stores every beat of your heart, whether you're sleeping, working out, or showering.  Wear the monitor for 2 weeks, remove on  02/14/20.   Follow-Up: At [facility], you and your health needs are our priority.  As part of our continuing mission to provide you with exceptional heart care, we have created designated Provider Care Teams.  These Care Teams include your primary Cardiologist (physician) and Advanced Practice Providers (APPs -  Physician Assistants and Nurse Practitioners) who all work together to provide you with the care you need, when you need it.  We recommend signing up for the patient portal called "MyChart".  Sign up information is provided on this After Visit Summary.  MyChart is used to connect with patients for Virtual Visits (Telemedicine).  Patients are able to view lab/test results, encounter notes, upcoming appointments, etc.  Non-urgent messages can be sent to your provider as well.   To learn more about what you can do with MyChart, go to [email or web address].    Your next appointment:   2 month(s)  The format for your next appointment:   In Person  Provider:   Jyl Heinz, MD   Other Instructions NA
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
## 2020-01-15 DIAGNOSIS — M545 Low back pain: Secondary | ICD-10-CM | POA: Diagnosis not present

## 2020-01-15 DIAGNOSIS — M47896 Other spondylosis, lumbar region: Secondary | ICD-10-CM | POA: Diagnosis not present

## 2020-01-15 DIAGNOSIS — G894 Chronic pain syndrome: Secondary | ICD-10-CM | POA: Diagnosis not present

## 2020-01-15 DIAGNOSIS — I739 Peripheral vascular disease, unspecified: Secondary | ICD-10-CM | POA: Diagnosis not present

## 2020-01-15 DIAGNOSIS — M4326 Fusion of spine, lumbar region: Secondary | ICD-10-CM | POA: Diagnosis not present

## 2020-01-31 DIAGNOSIS — I739 Peripheral vascular disease, unspecified: Secondary | ICD-10-CM | POA: Diagnosis not present

## 2020-02-01 DIAGNOSIS — E119 Type 2 diabetes mellitus without complications: Secondary | ICD-10-CM | POA: Diagnosis not present

## 2020-02-01 DIAGNOSIS — I10 Essential (primary) hypertension: Secondary | ICD-10-CM | POA: Diagnosis not present

## 2020-02-01 DIAGNOSIS — E559 Vitamin D deficiency, unspecified: Secondary | ICD-10-CM | POA: Diagnosis not present

## 2020-02-01 DIAGNOSIS — Z79899 Other long term (current) drug therapy: Secondary | ICD-10-CM | POA: Diagnosis not present

## 2020-02-01 DIAGNOSIS — E782 Mixed hyperlipidemia: Secondary | ICD-10-CM | POA: Diagnosis not present

## 2020-02-01 DIAGNOSIS — I739 Peripheral vascular disease, unspecified: Secondary | ICD-10-CM | POA: Diagnosis not present

## 2020-02-05 DIAGNOSIS — H35013 Changes in retinal vascular appearance, bilateral: Secondary | ICD-10-CM | POA: Diagnosis not present

## 2020-02-05 DIAGNOSIS — H2513 Age-related nuclear cataract, bilateral: Secondary | ICD-10-CM | POA: Diagnosis not present

## 2020-02-05 DIAGNOSIS — R002 Palpitations: Secondary | ICD-10-CM | POA: Diagnosis not present

## 2020-02-09 DIAGNOSIS — I6522 Occlusion and stenosis of left carotid artery: Secondary | ICD-10-CM | POA: Diagnosis not present

## 2020-02-09 DIAGNOSIS — I739 Peripheral vascular disease, unspecified: Secondary | ICD-10-CM | POA: Diagnosis not present

## 2020-02-14 DIAGNOSIS — Z01812 Encounter for preprocedural laboratory examination: Secondary | ICD-10-CM | POA: Diagnosis not present

## 2020-02-14 DIAGNOSIS — I739 Peripheral vascular disease, unspecified: Secondary | ICD-10-CM | POA: Diagnosis not present

## 2020-02-14 DIAGNOSIS — J449 Chronic obstructive pulmonary disease, unspecified: Secondary | ICD-10-CM | POA: Diagnosis not present

## 2020-02-14 DIAGNOSIS — Z01810 Encounter for preprocedural cardiovascular examination: Secondary | ICD-10-CM | POA: Diagnosis not present

## 2020-02-14 DIAGNOSIS — E119 Type 2 diabetes mellitus without complications: Secondary | ICD-10-CM | POA: Diagnosis not present

## 2020-02-14 DIAGNOSIS — E785 Hyperlipidemia, unspecified: Secondary | ICD-10-CM | POA: Diagnosis not present

## 2020-02-16 DIAGNOSIS — G894 Chronic pain syndrome: Secondary | ICD-10-CM | POA: Diagnosis not present

## 2020-02-16 DIAGNOSIS — Z79899 Other long term (current) drug therapy: Secondary | ICD-10-CM | POA: Diagnosis not present

## 2020-02-16 DIAGNOSIS — Z79891 Long term (current) use of opiate analgesic: Secondary | ICD-10-CM | POA: Diagnosis not present

## 2020-02-16 DIAGNOSIS — M4326 Fusion of spine, lumbar region: Secondary | ICD-10-CM | POA: Diagnosis not present

## 2020-02-16 DIAGNOSIS — M545 Low back pain: Secondary | ICD-10-CM | POA: Diagnosis not present

## 2020-02-19 DIAGNOSIS — Z01812 Encounter for preprocedural laboratory examination: Secondary | ICD-10-CM | POA: Diagnosis not present

## 2020-02-19 DIAGNOSIS — Z20822 Contact with and (suspected) exposure to covid-19: Secondary | ICD-10-CM | POA: Diagnosis not present

## 2020-02-19 DIAGNOSIS — I739 Peripheral vascular disease, unspecified: Secondary | ICD-10-CM | POA: Diagnosis not present

## 2020-02-22 DIAGNOSIS — I739 Peripheral vascular disease, unspecified: Secondary | ICD-10-CM | POA: Diagnosis not present

## 2020-02-22 DIAGNOSIS — G47419 Narcolepsy without cataplexy: Secondary | ICD-10-CM | POA: Diagnosis not present

## 2020-02-22 DIAGNOSIS — D72829 Elevated white blood cell count, unspecified: Secondary | ICD-10-CM | POA: Diagnosis not present

## 2020-02-22 DIAGNOSIS — Z888 Allergy status to other drugs, medicaments and biological substances status: Secondary | ICD-10-CM | POA: Diagnosis not present

## 2020-02-22 DIAGNOSIS — Z955 Presence of coronary angioplasty implant and graft: Secondary | ICD-10-CM | POA: Diagnosis not present

## 2020-02-22 DIAGNOSIS — K219 Gastro-esophageal reflux disease without esophagitis: Secondary | ICD-10-CM | POA: Diagnosis not present

## 2020-02-22 DIAGNOSIS — J449 Chronic obstructive pulmonary disease, unspecified: Secondary | ICD-10-CM | POA: Diagnosis not present

## 2020-02-22 DIAGNOSIS — E785 Hyperlipidemia, unspecified: Secondary | ICD-10-CM | POA: Diagnosis not present

## 2020-02-22 DIAGNOSIS — I959 Hypotension, unspecified: Secondary | ICD-10-CM | POA: Diagnosis not present

## 2020-02-22 DIAGNOSIS — I70213 Atherosclerosis of native arteries of extremities with intermittent claudication, bilateral legs: Secondary | ICD-10-CM | POA: Diagnosis not present

## 2020-02-22 DIAGNOSIS — L258 Unspecified contact dermatitis due to other agents: Secondary | ICD-10-CM | POA: Diagnosis not present

## 2020-02-22 DIAGNOSIS — I252 Old myocardial infarction: Secondary | ICD-10-CM | POA: Diagnosis not present

## 2020-02-22 DIAGNOSIS — I2510 Atherosclerotic heart disease of native coronary artery without angina pectoris: Secondary | ICD-10-CM | POA: Diagnosis not present

## 2020-02-22 DIAGNOSIS — Z91030 Bee allergy status: Secondary | ICD-10-CM | POA: Diagnosis not present

## 2020-02-22 DIAGNOSIS — I70219 Atherosclerosis of native arteries of extremities with intermittent claudication, unspecified extremity: Secondary | ICD-10-CM | POA: Diagnosis not present

## 2020-02-22 DIAGNOSIS — E1142 Type 2 diabetes mellitus with diabetic polyneuropathy: Secondary | ICD-10-CM | POA: Diagnosis not present

## 2020-02-22 DIAGNOSIS — E1151 Type 2 diabetes mellitus with diabetic peripheral angiopathy without gangrene: Secondary | ICD-10-CM | POA: Diagnosis not present

## 2020-02-22 DIAGNOSIS — I7091 Generalized atherosclerosis: Secondary | ICD-10-CM | POA: Diagnosis not present

## 2020-02-22 DIAGNOSIS — E119 Type 2 diabetes mellitus without complications: Secondary | ICD-10-CM | POA: Diagnosis not present

## 2020-02-22 DIAGNOSIS — I10 Essential (primary) hypertension: Secondary | ICD-10-CM | POA: Diagnosis not present

## 2020-02-22 DIAGNOSIS — Z902 Acquired absence of lung [part of]: Secondary | ICD-10-CM | POA: Diagnosis not present

## 2020-02-22 DIAGNOSIS — Z7984 Long term (current) use of oral hypoglycemic drugs: Secondary | ICD-10-CM | POA: Diagnosis not present

## 2020-02-22 DIAGNOSIS — Z79899 Other long term (current) drug therapy: Secondary | ICD-10-CM | POA: Diagnosis not present

## 2020-02-22 DIAGNOSIS — Z86711 Personal history of pulmonary embolism: Secondary | ICD-10-CM | POA: Diagnosis not present

## 2020-02-22 DIAGNOSIS — R0602 Shortness of breath: Secondary | ICD-10-CM | POA: Diagnosis not present

## 2020-02-22 DIAGNOSIS — Z95828 Presence of other vascular implants and grafts: Secondary | ICD-10-CM | POA: Diagnosis not present

## 2020-03-14 ENCOUNTER — Ambulatory Visit: Payer: Medicare Other | Admitting: Cardiology

## 2020-03-14 DIAGNOSIS — M4326 Fusion of spine, lumbar region: Secondary | ICD-10-CM | POA: Diagnosis not present

## 2020-03-14 DIAGNOSIS — M545 Low back pain: Secondary | ICD-10-CM | POA: Diagnosis not present

## 2020-03-14 DIAGNOSIS — G894 Chronic pain syndrome: Secondary | ICD-10-CM | POA: Diagnosis not present

## 2020-03-18 DIAGNOSIS — Z7902 Long term (current) use of antithrombotics/antiplatelets: Secondary | ICD-10-CM | POA: Diagnosis not present

## 2020-03-18 DIAGNOSIS — Z452 Encounter for adjustment and management of vascular access device: Secondary | ICD-10-CM | POA: Diagnosis not present

## 2020-03-18 DIAGNOSIS — I739 Peripheral vascular disease, unspecified: Secondary | ICD-10-CM | POA: Diagnosis not present

## 2020-03-18 DIAGNOSIS — Z888 Allergy status to other drugs, medicaments and biological substances status: Secondary | ICD-10-CM | POA: Diagnosis not present

## 2020-03-18 DIAGNOSIS — Z86711 Personal history of pulmonary embolism: Secondary | ICD-10-CM | POA: Diagnosis not present

## 2020-03-18 DIAGNOSIS — L03314 Cellulitis of groin: Secondary | ICD-10-CM | POA: Diagnosis not present

## 2020-03-18 DIAGNOSIS — J449 Chronic obstructive pulmonary disease, unspecified: Secondary | ICD-10-CM | POA: Diagnosis not present

## 2020-03-18 DIAGNOSIS — Z91038 Other insect allergy status: Secondary | ICD-10-CM | POA: Diagnosis not present

## 2020-03-18 DIAGNOSIS — T8141XA Infection following a procedure, superficial incisional surgical site, initial encounter: Secondary | ICD-10-CM | POA: Diagnosis not present

## 2020-03-18 DIAGNOSIS — I252 Old myocardial infarction: Secondary | ICD-10-CM | POA: Diagnosis not present

## 2020-03-18 DIAGNOSIS — R600 Localized edema: Secondary | ICD-10-CM | POA: Diagnosis not present

## 2020-03-18 DIAGNOSIS — E1151 Type 2 diabetes mellitus with diabetic peripheral angiopathy without gangrene: Secondary | ICD-10-CM | POA: Diagnosis not present

## 2020-03-18 DIAGNOSIS — Z79899 Other long term (current) drug therapy: Secondary | ICD-10-CM | POA: Diagnosis not present

## 2020-03-18 DIAGNOSIS — Z7982 Long term (current) use of aspirin: Secondary | ICD-10-CM | POA: Diagnosis not present

## 2020-03-18 DIAGNOSIS — E785 Hyperlipidemia, unspecified: Secondary | ICD-10-CM | POA: Diagnosis not present

## 2020-03-18 DIAGNOSIS — I2510 Atherosclerotic heart disease of native coronary artery without angina pectoris: Secondary | ICD-10-CM | POA: Diagnosis not present

## 2020-03-18 DIAGNOSIS — L03116 Cellulitis of left lower limb: Secondary | ICD-10-CM | POA: Diagnosis not present

## 2020-03-18 DIAGNOSIS — Z7984 Long term (current) use of oral hypoglycemic drugs: Secondary | ICD-10-CM | POA: Diagnosis not present

## 2020-03-18 DIAGNOSIS — Z87891 Personal history of nicotine dependence: Secondary | ICD-10-CM | POA: Diagnosis not present

## 2020-04-01 DIAGNOSIS — Z09 Encounter for follow-up examination after completed treatment for conditions other than malignant neoplasm: Secondary | ICD-10-CM | POA: Diagnosis not present

## 2020-04-01 DIAGNOSIS — I739 Peripheral vascular disease, unspecified: Secondary | ICD-10-CM | POA: Diagnosis not present

## 2020-04-01 DIAGNOSIS — I10 Essential (primary) hypertension: Secondary | ICD-10-CM | POA: Diagnosis not present

## 2020-04-01 DIAGNOSIS — L03116 Cellulitis of left lower limb: Secondary | ICD-10-CM | POA: Diagnosis not present

## 2020-04-01 DIAGNOSIS — E1140 Type 2 diabetes mellitus with diabetic neuropathy, unspecified: Secondary | ICD-10-CM | POA: Diagnosis not present

## 2020-04-23 DIAGNOSIS — E559 Vitamin D deficiency, unspecified: Secondary | ICD-10-CM | POA: Diagnosis not present

## 2020-04-23 DIAGNOSIS — E1151 Type 2 diabetes mellitus with diabetic peripheral angiopathy without gangrene: Secondary | ICD-10-CM | POA: Diagnosis not present

## 2020-04-23 DIAGNOSIS — E119 Type 2 diabetes mellitus without complications: Secondary | ICD-10-CM | POA: Diagnosis not present

## 2020-04-23 DIAGNOSIS — Z79899 Other long term (current) drug therapy: Secondary | ICD-10-CM | POA: Diagnosis not present

## 2020-04-23 DIAGNOSIS — E785 Hyperlipidemia, unspecified: Secondary | ICD-10-CM | POA: Diagnosis not present

## 2020-04-23 DIAGNOSIS — I10 Essential (primary) hypertension: Secondary | ICD-10-CM | POA: Diagnosis not present

## 2020-04-24 DIAGNOSIS — G4711 Idiopathic hypersomnia with long sleep time: Secondary | ICD-10-CM | POA: Diagnosis not present

## 2020-04-26 DIAGNOSIS — Z79891 Long term (current) use of opiate analgesic: Secondary | ICD-10-CM | POA: Diagnosis not present

## 2020-04-26 DIAGNOSIS — M545 Low back pain: Secondary | ICD-10-CM | POA: Diagnosis not present

## 2020-04-26 DIAGNOSIS — M4326 Fusion of spine, lumbar region: Secondary | ICD-10-CM | POA: Diagnosis not present

## 2020-04-26 DIAGNOSIS — Z79899 Other long term (current) drug therapy: Secondary | ICD-10-CM | POA: Diagnosis not present

## 2020-04-26 DIAGNOSIS — G894 Chronic pain syndrome: Secondary | ICD-10-CM | POA: Diagnosis not present

## 2020-05-28 DIAGNOSIS — G894 Chronic pain syndrome: Secondary | ICD-10-CM | POA: Diagnosis not present

## 2020-05-28 DIAGNOSIS — M4326 Fusion of spine, lumbar region: Secondary | ICD-10-CM | POA: Diagnosis not present

## 2020-05-28 DIAGNOSIS — Z79899 Other long term (current) drug therapy: Secondary | ICD-10-CM | POA: Diagnosis not present

## 2020-05-28 DIAGNOSIS — Z79891 Long term (current) use of opiate analgesic: Secondary | ICD-10-CM | POA: Diagnosis not present

## 2020-05-28 DIAGNOSIS — M545 Low back pain, unspecified: Secondary | ICD-10-CM | POA: Diagnosis not present

## 2020-06-10 DIAGNOSIS — I739 Peripheral vascular disease, unspecified: Secondary | ICD-10-CM | POA: Diagnosis not present

## 2020-06-14 DIAGNOSIS — I739 Peripheral vascular disease, unspecified: Secondary | ICD-10-CM | POA: Diagnosis not present

## 2020-06-14 DIAGNOSIS — I6523 Occlusion and stenosis of bilateral carotid arteries: Secondary | ICD-10-CM | POA: Diagnosis not present

## 2020-07-01 DIAGNOSIS — Z79891 Long term (current) use of opiate analgesic: Secondary | ICD-10-CM | POA: Diagnosis not present

## 2020-07-01 DIAGNOSIS — G894 Chronic pain syndrome: Secondary | ICD-10-CM | POA: Diagnosis not present

## 2020-07-01 DIAGNOSIS — M4326 Fusion of spine, lumbar region: Secondary | ICD-10-CM | POA: Diagnosis not present

## 2020-07-01 DIAGNOSIS — Z79899 Other long term (current) drug therapy: Secondary | ICD-10-CM | POA: Diagnosis not present

## 2020-07-01 DIAGNOSIS — M545 Low back pain, unspecified: Secondary | ICD-10-CM | POA: Diagnosis not present

## 2020-07-03 DIAGNOSIS — I6523 Occlusion and stenosis of bilateral carotid arteries: Secondary | ICD-10-CM | POA: Diagnosis not present

## 2020-07-03 DIAGNOSIS — I739 Peripheral vascular disease, unspecified: Secondary | ICD-10-CM | POA: Diagnosis not present

## 2020-07-10 DIAGNOSIS — I6523 Occlusion and stenosis of bilateral carotid arteries: Secondary | ICD-10-CM | POA: Diagnosis not present

## 2020-07-10 DIAGNOSIS — I6521 Occlusion and stenosis of right carotid artery: Secondary | ICD-10-CM | POA: Diagnosis not present

## 2020-07-10 DIAGNOSIS — I739 Peripheral vascular disease, unspecified: Secondary | ICD-10-CM | POA: Diagnosis not present

## 2020-07-10 DIAGNOSIS — Z01818 Encounter for other preprocedural examination: Secondary | ICD-10-CM | POA: Diagnosis not present

## 2020-07-25 DIAGNOSIS — M5106 Intervertebral disc disorders with myelopathy, lumbar region: Secondary | ICD-10-CM | POA: Diagnosis not present

## 2020-07-25 DIAGNOSIS — M545 Low back pain, unspecified: Secondary | ICD-10-CM | POA: Diagnosis not present

## 2020-07-25 DIAGNOSIS — M4326 Fusion of spine, lumbar region: Secondary | ICD-10-CM | POA: Diagnosis not present

## 2020-07-25 DIAGNOSIS — G894 Chronic pain syndrome: Secondary | ICD-10-CM | POA: Diagnosis not present

## 2020-07-29 DIAGNOSIS — I272 Pulmonary hypertension, unspecified: Secondary | ICD-10-CM | POA: Diagnosis not present

## 2020-07-29 DIAGNOSIS — Z79899 Other long term (current) drug therapy: Secondary | ICD-10-CM | POA: Diagnosis not present

## 2020-07-29 DIAGNOSIS — E1151 Type 2 diabetes mellitus with diabetic peripheral angiopathy without gangrene: Secondary | ICD-10-CM | POA: Diagnosis not present

## 2020-07-29 DIAGNOSIS — I6521 Occlusion and stenosis of right carotid artery: Secondary | ICD-10-CM | POA: Diagnosis not present

## 2020-07-29 DIAGNOSIS — Z7902 Long term (current) use of antithrombotics/antiplatelets: Secondary | ICD-10-CM | POA: Diagnosis not present

## 2020-07-29 DIAGNOSIS — R519 Headache, unspecified: Secondary | ICD-10-CM | POA: Diagnosis not present

## 2020-07-29 DIAGNOSIS — Z7982 Long term (current) use of aspirin: Secondary | ICD-10-CM | POA: Diagnosis not present

## 2020-07-29 DIAGNOSIS — Z955 Presence of coronary angioplasty implant and graft: Secondary | ICD-10-CM | POA: Diagnosis not present

## 2020-07-29 DIAGNOSIS — E785 Hyperlipidemia, unspecified: Secondary | ICD-10-CM | POA: Diagnosis not present

## 2020-07-29 DIAGNOSIS — G9389 Other specified disorders of brain: Secondary | ICD-10-CM | POA: Diagnosis not present

## 2020-07-29 DIAGNOSIS — I2510 Atherosclerotic heart disease of native coronary artery without angina pectoris: Secondary | ICD-10-CM | POA: Diagnosis not present

## 2020-07-29 DIAGNOSIS — Z87891 Personal history of nicotine dependence: Secondary | ICD-10-CM | POA: Diagnosis not present

## 2020-07-29 DIAGNOSIS — J449 Chronic obstructive pulmonary disease, unspecified: Secondary | ICD-10-CM | POA: Diagnosis not present

## 2020-07-29 DIAGNOSIS — Z86711 Personal history of pulmonary embolism: Secondary | ICD-10-CM | POA: Diagnosis not present

## 2020-08-01 DIAGNOSIS — I6521 Occlusion and stenosis of right carotid artery: Secondary | ICD-10-CM | POA: Insufficient documentation

## 2020-08-01 HISTORY — DX: Occlusion and stenosis of right carotid artery: I65.21

## 2020-08-13 DIAGNOSIS — Z888 Allergy status to other drugs, medicaments and biological substances status: Secondary | ICD-10-CM | POA: Diagnosis not present

## 2020-08-13 DIAGNOSIS — Z8673 Personal history of transient ischemic attack (TIA), and cerebral infarction without residual deficits: Secondary | ICD-10-CM | POA: Diagnosis not present

## 2020-08-13 DIAGNOSIS — Z7902 Long term (current) use of antithrombotics/antiplatelets: Secondary | ICD-10-CM | POA: Diagnosis not present

## 2020-08-13 DIAGNOSIS — I739 Peripheral vascular disease, unspecified: Secondary | ICD-10-CM | POA: Diagnosis not present

## 2020-08-13 DIAGNOSIS — Z7982 Long term (current) use of aspirin: Secondary | ICD-10-CM | POA: Diagnosis not present

## 2020-08-13 DIAGNOSIS — I2510 Atherosclerotic heart disease of native coronary artery without angina pectoris: Secondary | ICD-10-CM | POA: Diagnosis not present

## 2020-08-13 DIAGNOSIS — Z96651 Presence of right artificial knee joint: Secondary | ICD-10-CM | POA: Diagnosis not present

## 2020-08-13 DIAGNOSIS — T8131XA Disruption of external operation (surgical) wound, not elsewhere classified, initial encounter: Secondary | ICD-10-CM | POA: Diagnosis not present

## 2020-08-13 DIAGNOSIS — Z87891 Personal history of nicotine dependence: Secondary | ICD-10-CM | POA: Diagnosis not present

## 2020-08-13 DIAGNOSIS — K219 Gastro-esophageal reflux disease without esophagitis: Secondary | ICD-10-CM | POA: Diagnosis not present

## 2020-08-13 DIAGNOSIS — G47419 Narcolepsy without cataplexy: Secondary | ICD-10-CM | POA: Diagnosis not present

## 2020-08-13 DIAGNOSIS — Z902 Acquired absence of lung [part of]: Secondary | ICD-10-CM | POA: Diagnosis not present

## 2020-08-13 DIAGNOSIS — Z9889 Other specified postprocedural states: Secondary | ICD-10-CM | POA: Diagnosis not present

## 2020-08-13 DIAGNOSIS — Z7984 Long term (current) use of oral hypoglycemic drugs: Secondary | ICD-10-CM | POA: Diagnosis not present

## 2020-08-13 DIAGNOSIS — Z955 Presence of coronary angioplasty implant and graft: Secondary | ICD-10-CM | POA: Diagnosis not present

## 2020-08-13 DIAGNOSIS — Z20822 Contact with and (suspected) exposure to covid-19: Secondary | ICD-10-CM | POA: Diagnosis not present

## 2020-08-13 DIAGNOSIS — B9561 Methicillin susceptible Staphylococcus aureus infection as the cause of diseases classified elsewhere: Secondary | ICD-10-CM | POA: Diagnosis not present

## 2020-08-13 DIAGNOSIS — Z91030 Bee allergy status: Secondary | ICD-10-CM | POA: Diagnosis not present

## 2020-08-13 DIAGNOSIS — Z86711 Personal history of pulmonary embolism: Secondary | ICD-10-CM | POA: Diagnosis not present

## 2020-08-13 DIAGNOSIS — Z7901 Long term (current) use of anticoagulants: Secondary | ICD-10-CM | POA: Diagnosis not present

## 2020-08-13 DIAGNOSIS — E119 Type 2 diabetes mellitus without complications: Secondary | ICD-10-CM | POA: Diagnosis not present

## 2020-08-13 DIAGNOSIS — J449 Chronic obstructive pulmonary disease, unspecified: Secondary | ICD-10-CM | POA: Diagnosis not present

## 2020-08-13 DIAGNOSIS — T8141XA Infection following a procedure, superficial incisional surgical site, initial encounter: Secondary | ICD-10-CM | POA: Diagnosis not present

## 2020-08-21 DIAGNOSIS — F32A Depression, unspecified: Secondary | ICD-10-CM | POA: Diagnosis not present

## 2020-08-21 DIAGNOSIS — M545 Low back pain, unspecified: Secondary | ICD-10-CM | POA: Diagnosis not present

## 2020-08-21 DIAGNOSIS — K219 Gastro-esophageal reflux disease without esophagitis: Secondary | ICD-10-CM | POA: Diagnosis not present

## 2020-08-21 DIAGNOSIS — T8141XA Infection following a procedure, superficial incisional surgical site, initial encounter: Secondary | ICD-10-CM | POA: Diagnosis not present

## 2020-08-21 DIAGNOSIS — I2510 Atherosclerotic heart disease of native coronary artery without angina pectoris: Secondary | ICD-10-CM | POA: Diagnosis not present

## 2020-08-21 DIAGNOSIS — M4326 Fusion of spine, lumbar region: Secondary | ICD-10-CM | POA: Diagnosis not present

## 2020-08-21 DIAGNOSIS — T8149XA Infection following a procedure, other surgical site, initial encounter: Secondary | ICD-10-CM | POA: Diagnosis not present

## 2020-08-21 DIAGNOSIS — F17200 Nicotine dependence, unspecified, uncomplicated: Secondary | ICD-10-CM | POA: Diagnosis not present

## 2020-08-21 DIAGNOSIS — J449 Chronic obstructive pulmonary disease, unspecified: Secondary | ICD-10-CM | POA: Diagnosis not present

## 2020-08-21 DIAGNOSIS — I6521 Occlusion and stenosis of right carotid artery: Secondary | ICD-10-CM | POA: Diagnosis not present

## 2020-08-21 DIAGNOSIS — B9561 Methicillin susceptible Staphylococcus aureus infection as the cause of diseases classified elsewhere: Secondary | ICD-10-CM | POA: Diagnosis not present

## 2020-08-21 DIAGNOSIS — J309 Allergic rhinitis, unspecified: Secondary | ICD-10-CM | POA: Diagnosis not present

## 2020-08-21 DIAGNOSIS — Z452 Encounter for adjustment and management of vascular access device: Secondary | ICD-10-CM | POA: Diagnosis not present

## 2020-08-21 DIAGNOSIS — E785 Hyperlipidemia, unspecified: Secondary | ICD-10-CM | POA: Diagnosis not present

## 2020-08-21 DIAGNOSIS — E559 Vitamin D deficiency, unspecified: Secondary | ICD-10-CM | POA: Diagnosis not present

## 2020-08-21 DIAGNOSIS — Z7984 Long term (current) use of oral hypoglycemic drugs: Secondary | ICD-10-CM | POA: Diagnosis not present

## 2020-08-21 DIAGNOSIS — I10 Essential (primary) hypertension: Secondary | ICD-10-CM | POA: Diagnosis not present

## 2020-08-21 DIAGNOSIS — T8131XA Disruption of external operation (surgical) wound, not elsewhere classified, initial encounter: Secondary | ICD-10-CM | POA: Diagnosis not present

## 2020-08-21 DIAGNOSIS — M810 Age-related osteoporosis without current pathological fracture: Secondary | ICD-10-CM | POA: Diagnosis not present

## 2020-08-21 DIAGNOSIS — K7469 Other cirrhosis of liver: Secondary | ICD-10-CM | POA: Diagnosis not present

## 2020-08-21 DIAGNOSIS — G894 Chronic pain syndrome: Secondary | ICD-10-CM | POA: Diagnosis not present

## 2020-08-21 DIAGNOSIS — E119 Type 2 diabetes mellitus without complications: Secondary | ICD-10-CM | POA: Diagnosis not present

## 2020-08-21 DIAGNOSIS — Z7902 Long term (current) use of antithrombotics/antiplatelets: Secondary | ICD-10-CM | POA: Diagnosis not present

## 2020-08-21 DIAGNOSIS — Z9181 History of falling: Secondary | ICD-10-CM | POA: Diagnosis not present

## 2020-08-28 DIAGNOSIS — Z452 Encounter for adjustment and management of vascular access device: Secondary | ICD-10-CM | POA: Diagnosis not present

## 2020-08-28 DIAGNOSIS — M810 Age-related osteoporosis without current pathological fracture: Secondary | ICD-10-CM | POA: Diagnosis not present

## 2020-08-28 DIAGNOSIS — I10 Essential (primary) hypertension: Secondary | ICD-10-CM | POA: Diagnosis not present

## 2020-08-28 DIAGNOSIS — E119 Type 2 diabetes mellitus without complications: Secondary | ICD-10-CM | POA: Diagnosis not present

## 2020-08-28 DIAGNOSIS — J449 Chronic obstructive pulmonary disease, unspecified: Secondary | ICD-10-CM | POA: Diagnosis not present

## 2020-08-28 DIAGNOSIS — F32A Depression, unspecified: Secondary | ICD-10-CM | POA: Diagnosis not present

## 2020-08-28 DIAGNOSIS — Z7984 Long term (current) use of oral hypoglycemic drugs: Secondary | ICD-10-CM | POA: Diagnosis not present

## 2020-08-28 DIAGNOSIS — K219 Gastro-esophageal reflux disease without esophagitis: Secondary | ICD-10-CM | POA: Diagnosis not present

## 2020-08-28 DIAGNOSIS — Z9181 History of falling: Secondary | ICD-10-CM | POA: Diagnosis not present

## 2020-08-28 DIAGNOSIS — E559 Vitamin D deficiency, unspecified: Secondary | ICD-10-CM | POA: Diagnosis not present

## 2020-08-28 DIAGNOSIS — F17200 Nicotine dependence, unspecified, uncomplicated: Secondary | ICD-10-CM | POA: Diagnosis not present

## 2020-08-28 DIAGNOSIS — T8141XA Infection following a procedure, superficial incisional surgical site, initial encounter: Secondary | ICD-10-CM | POA: Diagnosis not present

## 2020-08-28 DIAGNOSIS — J309 Allergic rhinitis, unspecified: Secondary | ICD-10-CM | POA: Diagnosis not present

## 2020-08-28 DIAGNOSIS — I2510 Atherosclerotic heart disease of native coronary artery without angina pectoris: Secondary | ICD-10-CM | POA: Diagnosis not present

## 2020-08-28 DIAGNOSIS — K7469 Other cirrhosis of liver: Secondary | ICD-10-CM | POA: Diagnosis not present

## 2020-08-28 DIAGNOSIS — E785 Hyperlipidemia, unspecified: Secondary | ICD-10-CM | POA: Diagnosis not present

## 2020-08-28 DIAGNOSIS — B9561 Methicillin susceptible Staphylococcus aureus infection as the cause of diseases classified elsewhere: Secondary | ICD-10-CM | POA: Diagnosis not present

## 2020-08-28 DIAGNOSIS — Z7902 Long term (current) use of antithrombotics/antiplatelets: Secondary | ICD-10-CM | POA: Diagnosis not present

## 2020-08-28 DIAGNOSIS — T8131XA Disruption of external operation (surgical) wound, not elsewhere classified, initial encounter: Secondary | ICD-10-CM | POA: Diagnosis not present

## 2020-08-28 DIAGNOSIS — I6521 Occlusion and stenosis of right carotid artery: Secondary | ICD-10-CM | POA: Diagnosis not present

## 2020-08-29 DIAGNOSIS — T8149XA Infection following a procedure, other surgical site, initial encounter: Secondary | ICD-10-CM | POA: Diagnosis not present

## 2020-09-04 DIAGNOSIS — F17200 Nicotine dependence, unspecified, uncomplicated: Secondary | ICD-10-CM | POA: Diagnosis not present

## 2020-09-04 DIAGNOSIS — I6521 Occlusion and stenosis of right carotid artery: Secondary | ICD-10-CM | POA: Diagnosis not present

## 2020-09-04 DIAGNOSIS — B9561 Methicillin susceptible Staphylococcus aureus infection as the cause of diseases classified elsewhere: Secondary | ICD-10-CM | POA: Diagnosis not present

## 2020-09-04 DIAGNOSIS — T8131XA Disruption of external operation (surgical) wound, not elsewhere classified, initial encounter: Secondary | ICD-10-CM | POA: Diagnosis not present

## 2020-09-04 DIAGNOSIS — Z7984 Long term (current) use of oral hypoglycemic drugs: Secondary | ICD-10-CM | POA: Diagnosis not present

## 2020-09-04 DIAGNOSIS — E785 Hyperlipidemia, unspecified: Secondary | ICD-10-CM | POA: Diagnosis not present

## 2020-09-04 DIAGNOSIS — F32A Depression, unspecified: Secondary | ICD-10-CM | POA: Diagnosis not present

## 2020-09-04 DIAGNOSIS — J449 Chronic obstructive pulmonary disease, unspecified: Secondary | ICD-10-CM | POA: Diagnosis not present

## 2020-09-04 DIAGNOSIS — I2510 Atherosclerotic heart disease of native coronary artery without angina pectoris: Secondary | ICD-10-CM | POA: Diagnosis not present

## 2020-09-04 DIAGNOSIS — K7469 Other cirrhosis of liver: Secondary | ICD-10-CM | POA: Diagnosis not present

## 2020-09-04 DIAGNOSIS — M810 Age-related osteoporosis without current pathological fracture: Secondary | ICD-10-CM | POA: Diagnosis not present

## 2020-09-04 DIAGNOSIS — Z9181 History of falling: Secondary | ICD-10-CM | POA: Diagnosis not present

## 2020-09-04 DIAGNOSIS — I10 Essential (primary) hypertension: Secondary | ICD-10-CM | POA: Diagnosis not present

## 2020-09-04 DIAGNOSIS — K219 Gastro-esophageal reflux disease without esophagitis: Secondary | ICD-10-CM | POA: Diagnosis not present

## 2020-09-04 DIAGNOSIS — Z452 Encounter for adjustment and management of vascular access device: Secondary | ICD-10-CM | POA: Diagnosis not present

## 2020-09-04 DIAGNOSIS — E119 Type 2 diabetes mellitus without complications: Secondary | ICD-10-CM | POA: Diagnosis not present

## 2020-09-04 DIAGNOSIS — Z7902 Long term (current) use of antithrombotics/antiplatelets: Secondary | ICD-10-CM | POA: Diagnosis not present

## 2020-09-04 DIAGNOSIS — J309 Allergic rhinitis, unspecified: Secondary | ICD-10-CM | POA: Diagnosis not present

## 2020-09-04 DIAGNOSIS — E559 Vitamin D deficiency, unspecified: Secondary | ICD-10-CM | POA: Diagnosis not present

## 2020-09-04 DIAGNOSIS — T8141XA Infection following a procedure, superficial incisional surgical site, initial encounter: Secondary | ICD-10-CM | POA: Diagnosis not present

## 2020-09-05 DIAGNOSIS — A4901 Methicillin susceptible Staphylococcus aureus infection, unspecified site: Secondary | ICD-10-CM | POA: Insufficient documentation

## 2020-09-05 DIAGNOSIS — Z452 Encounter for adjustment and management of vascular access device: Secondary | ICD-10-CM | POA: Insufficient documentation

## 2020-09-05 DIAGNOSIS — T827XXA Infection and inflammatory reaction due to other cardiac and vascular devices, implants and grafts, initial encounter: Secondary | ICD-10-CM | POA: Insufficient documentation

## 2020-09-05 DIAGNOSIS — T827XXD Infection and inflammatory reaction due to other cardiac and vascular devices, implants and grafts, subsequent encounter: Secondary | ICD-10-CM | POA: Diagnosis not present

## 2020-09-05 DIAGNOSIS — Z792 Long term (current) use of antibiotics: Secondary | ICD-10-CM

## 2020-09-05 DIAGNOSIS — T8149XA Infection following a procedure, other surgical site, initial encounter: Secondary | ICD-10-CM | POA: Insufficient documentation

## 2020-09-05 HISTORY — DX: Long term (current) use of antibiotics: Z79.2

## 2020-09-05 HISTORY — DX: Infection following a procedure, other surgical site, initial encounter: T81.49XA

## 2020-09-05 HISTORY — DX: Infection and inflammatory reaction due to other cardiac and vascular devices, implants and grafts, initial encounter: T82.7XXA

## 2020-09-05 HISTORY — DX: Encounter for adjustment and management of vascular access device: Z45.2

## 2020-09-05 HISTORY — DX: Methicillin susceptible Staphylococcus aureus infection, unspecified site: A49.01

## 2020-09-06 DIAGNOSIS — T8149XA Infection following a procedure, other surgical site, initial encounter: Secondary | ICD-10-CM | POA: Diagnosis not present

## 2020-09-10 DIAGNOSIS — T8149XA Infection following a procedure, other surgical site, initial encounter: Secondary | ICD-10-CM | POA: Diagnosis not present

## 2020-09-12 DIAGNOSIS — F17200 Nicotine dependence, unspecified, uncomplicated: Secondary | ICD-10-CM | POA: Diagnosis not present

## 2020-09-12 DIAGNOSIS — E559 Vitamin D deficiency, unspecified: Secondary | ICD-10-CM | POA: Diagnosis not present

## 2020-09-12 DIAGNOSIS — F32A Depression, unspecified: Secondary | ICD-10-CM | POA: Diagnosis not present

## 2020-09-12 DIAGNOSIS — J449 Chronic obstructive pulmonary disease, unspecified: Secondary | ICD-10-CM | POA: Diagnosis not present

## 2020-09-12 DIAGNOSIS — K7469 Other cirrhosis of liver: Secondary | ICD-10-CM | POA: Diagnosis not present

## 2020-09-12 DIAGNOSIS — Z452 Encounter for adjustment and management of vascular access device: Secondary | ICD-10-CM | POA: Diagnosis not present

## 2020-09-12 DIAGNOSIS — J309 Allergic rhinitis, unspecified: Secondary | ICD-10-CM | POA: Diagnosis not present

## 2020-09-12 DIAGNOSIS — T8131XA Disruption of external operation (surgical) wound, not elsewhere classified, initial encounter: Secondary | ICD-10-CM | POA: Diagnosis not present

## 2020-09-12 DIAGNOSIS — I6521 Occlusion and stenosis of right carotid artery: Secondary | ICD-10-CM | POA: Diagnosis not present

## 2020-09-12 DIAGNOSIS — E785 Hyperlipidemia, unspecified: Secondary | ICD-10-CM | POA: Diagnosis not present

## 2020-09-12 DIAGNOSIS — I2510 Atherosclerotic heart disease of native coronary artery without angina pectoris: Secondary | ICD-10-CM | POA: Diagnosis not present

## 2020-09-12 DIAGNOSIS — Z9181 History of falling: Secondary | ICD-10-CM | POA: Diagnosis not present

## 2020-09-12 DIAGNOSIS — K219 Gastro-esophageal reflux disease without esophagitis: Secondary | ICD-10-CM | POA: Diagnosis not present

## 2020-09-12 DIAGNOSIS — T8141XA Infection following a procedure, superficial incisional surgical site, initial encounter: Secondary | ICD-10-CM | POA: Diagnosis not present

## 2020-09-12 DIAGNOSIS — Z7902 Long term (current) use of antithrombotics/antiplatelets: Secondary | ICD-10-CM | POA: Diagnosis not present

## 2020-09-12 DIAGNOSIS — B9561 Methicillin susceptible Staphylococcus aureus infection as the cause of diseases classified elsewhere: Secondary | ICD-10-CM | POA: Diagnosis not present

## 2020-09-12 DIAGNOSIS — E119 Type 2 diabetes mellitus without complications: Secondary | ICD-10-CM | POA: Diagnosis not present

## 2020-09-12 DIAGNOSIS — Z7984 Long term (current) use of oral hypoglycemic drugs: Secondary | ICD-10-CM | POA: Diagnosis not present

## 2020-09-12 DIAGNOSIS — M810 Age-related osteoporosis without current pathological fracture: Secondary | ICD-10-CM | POA: Diagnosis not present

## 2020-09-12 DIAGNOSIS — I10 Essential (primary) hypertension: Secondary | ICD-10-CM | POA: Diagnosis not present

## 2020-09-20 DIAGNOSIS — Z7984 Long term (current) use of oral hypoglycemic drugs: Secondary | ICD-10-CM | POA: Diagnosis not present

## 2020-09-20 DIAGNOSIS — I6521 Occlusion and stenosis of right carotid artery: Secondary | ICD-10-CM | POA: Diagnosis not present

## 2020-09-20 DIAGNOSIS — J309 Allergic rhinitis, unspecified: Secondary | ICD-10-CM | POA: Diagnosis not present

## 2020-09-20 DIAGNOSIS — K7469 Other cirrhosis of liver: Secondary | ICD-10-CM | POA: Diagnosis not present

## 2020-09-20 DIAGNOSIS — I10 Essential (primary) hypertension: Secondary | ICD-10-CM | POA: Diagnosis not present

## 2020-09-20 DIAGNOSIS — B9561 Methicillin susceptible Staphylococcus aureus infection as the cause of diseases classified elsewhere: Secondary | ICD-10-CM | POA: Diagnosis not present

## 2020-09-20 DIAGNOSIS — K219 Gastro-esophageal reflux disease without esophagitis: Secondary | ICD-10-CM | POA: Diagnosis not present

## 2020-09-20 DIAGNOSIS — T8131XA Disruption of external operation (surgical) wound, not elsewhere classified, initial encounter: Secondary | ICD-10-CM | POA: Diagnosis not present

## 2020-09-20 DIAGNOSIS — E559 Vitamin D deficiency, unspecified: Secondary | ICD-10-CM | POA: Diagnosis not present

## 2020-09-20 DIAGNOSIS — I2510 Atherosclerotic heart disease of native coronary artery without angina pectoris: Secondary | ICD-10-CM | POA: Diagnosis not present

## 2020-09-20 DIAGNOSIS — Z452 Encounter for adjustment and management of vascular access device: Secondary | ICD-10-CM | POA: Diagnosis not present

## 2020-09-20 DIAGNOSIS — Z7902 Long term (current) use of antithrombotics/antiplatelets: Secondary | ICD-10-CM | POA: Diagnosis not present

## 2020-09-20 DIAGNOSIS — E119 Type 2 diabetes mellitus without complications: Secondary | ICD-10-CM | POA: Diagnosis not present

## 2020-09-20 DIAGNOSIS — F32A Depression, unspecified: Secondary | ICD-10-CM | POA: Diagnosis not present

## 2020-09-20 DIAGNOSIS — J449 Chronic obstructive pulmonary disease, unspecified: Secondary | ICD-10-CM | POA: Diagnosis not present

## 2020-09-20 DIAGNOSIS — F17200 Nicotine dependence, unspecified, uncomplicated: Secondary | ICD-10-CM | POA: Diagnosis not present

## 2020-09-20 DIAGNOSIS — E785 Hyperlipidemia, unspecified: Secondary | ICD-10-CM | POA: Diagnosis not present

## 2020-09-20 DIAGNOSIS — Z9181 History of falling: Secondary | ICD-10-CM | POA: Diagnosis not present

## 2020-09-20 DIAGNOSIS — M810 Age-related osteoporosis without current pathological fracture: Secondary | ICD-10-CM | POA: Diagnosis not present

## 2020-09-20 DIAGNOSIS — T8141XA Infection following a procedure, superficial incisional surgical site, initial encounter: Secondary | ICD-10-CM | POA: Diagnosis not present

## 2020-10-01 DIAGNOSIS — I2510 Atherosclerotic heart disease of native coronary artery without angina pectoris: Secondary | ICD-10-CM | POA: Diagnosis not present

## 2020-10-01 DIAGNOSIS — I10 Essential (primary) hypertension: Secondary | ICD-10-CM | POA: Diagnosis not present

## 2020-10-01 DIAGNOSIS — Z79899 Other long term (current) drug therapy: Secondary | ICD-10-CM | POA: Diagnosis not present

## 2020-10-01 DIAGNOSIS — I739 Peripheral vascular disease, unspecified: Secondary | ICD-10-CM | POA: Diagnosis not present

## 2020-10-01 DIAGNOSIS — E119 Type 2 diabetes mellitus without complications: Secondary | ICD-10-CM | POA: Diagnosis not present

## 2020-10-01 DIAGNOSIS — F32A Depression, unspecified: Secondary | ICD-10-CM | POA: Diagnosis not present

## 2020-10-01 DIAGNOSIS — J309 Allergic rhinitis, unspecified: Secondary | ICD-10-CM | POA: Diagnosis not present

## 2020-10-01 DIAGNOSIS — E559 Vitamin D deficiency, unspecified: Secondary | ICD-10-CM | POA: Diagnosis not present

## 2020-10-01 DIAGNOSIS — E782 Mixed hyperlipidemia: Secondary | ICD-10-CM | POA: Diagnosis not present

## 2020-10-02 DIAGNOSIS — E785 Hyperlipidemia, unspecified: Secondary | ICD-10-CM | POA: Diagnosis not present

## 2020-10-02 DIAGNOSIS — Z0000 Encounter for general adult medical examination without abnormal findings: Secondary | ICD-10-CM | POA: Diagnosis not present

## 2020-10-07 DIAGNOSIS — M4326 Fusion of spine, lumbar region: Secondary | ICD-10-CM | POA: Diagnosis not present

## 2020-10-07 DIAGNOSIS — M545 Low back pain, unspecified: Secondary | ICD-10-CM | POA: Diagnosis not present

## 2020-10-07 DIAGNOSIS — G894 Chronic pain syndrome: Secondary | ICD-10-CM | POA: Diagnosis not present

## 2020-10-23 DIAGNOSIS — I951 Orthostatic hypotension: Secondary | ICD-10-CM | POA: Diagnosis not present

## 2020-10-23 DIAGNOSIS — G47419 Narcolepsy without cataplexy: Secondary | ICD-10-CM | POA: Diagnosis not present

## 2020-10-23 DIAGNOSIS — G8929 Other chronic pain: Secondary | ICD-10-CM | POA: Diagnosis not present

## 2020-10-23 DIAGNOSIS — R296 Repeated falls: Secondary | ICD-10-CM | POA: Diagnosis not present

## 2020-10-29 DIAGNOSIS — I629 Nontraumatic intracranial hemorrhage, unspecified: Secondary | ICD-10-CM | POA: Diagnosis not present

## 2020-10-29 DIAGNOSIS — I6502 Occlusion and stenosis of left vertebral artery: Secondary | ICD-10-CM | POA: Diagnosis not present

## 2020-10-29 DIAGNOSIS — M26602 Left temporomandibular joint disorder, unspecified: Secondary | ICD-10-CM | POA: Diagnosis not present

## 2020-10-29 DIAGNOSIS — Z9981 Dependence on supplemental oxygen: Secondary | ICD-10-CM | POA: Diagnosis not present

## 2020-10-29 DIAGNOSIS — R519 Headache, unspecified: Secondary | ICD-10-CM | POA: Diagnosis not present

## 2020-10-29 DIAGNOSIS — I517 Cardiomegaly: Secondary | ICD-10-CM | POA: Diagnosis not present

## 2020-10-29 DIAGNOSIS — I6389 Other cerebral infarction: Secondary | ICD-10-CM | POA: Diagnosis not present

## 2020-10-29 DIAGNOSIS — I6503 Occlusion and stenosis of bilateral vertebral arteries: Secondary | ICD-10-CM | POA: Diagnosis not present

## 2020-10-29 DIAGNOSIS — Z955 Presence of coronary angioplasty implant and graft: Secondary | ICD-10-CM | POA: Diagnosis not present

## 2020-10-29 DIAGNOSIS — R918 Other nonspecific abnormal finding of lung field: Secondary | ICD-10-CM | POA: Diagnosis not present

## 2020-10-29 DIAGNOSIS — Z8673 Personal history of transient ischemic attack (TIA), and cerebral infarction without residual deficits: Secondary | ICD-10-CM | POA: Diagnosis not present

## 2020-10-29 DIAGNOSIS — Z7984 Long term (current) use of oral hypoglycemic drugs: Secondary | ICD-10-CM | POA: Diagnosis not present

## 2020-10-29 DIAGNOSIS — I619 Nontraumatic intracerebral hemorrhage, unspecified: Secondary | ICD-10-CM | POA: Diagnosis not present

## 2020-10-29 DIAGNOSIS — I252 Old myocardial infarction: Secondary | ICD-10-CM | POA: Diagnosis not present

## 2020-10-29 DIAGNOSIS — F17210 Nicotine dependence, cigarettes, uncomplicated: Secondary | ICD-10-CM | POA: Diagnosis not present

## 2020-10-29 DIAGNOSIS — E222 Syndrome of inappropriate secretion of antidiuretic hormone: Secondary | ICD-10-CM | POA: Diagnosis not present

## 2020-10-29 DIAGNOSIS — I2699 Other pulmonary embolism without acute cor pulmonale: Secondary | ICD-10-CM | POA: Diagnosis not present

## 2020-10-29 DIAGNOSIS — I672 Cerebral atherosclerosis: Secondary | ICD-10-CM | POA: Diagnosis not present

## 2020-10-29 DIAGNOSIS — R0602 Shortness of breath: Secondary | ICD-10-CM | POA: Diagnosis not present

## 2020-10-29 DIAGNOSIS — R0902 Hypoxemia: Secondary | ICD-10-CM | POA: Diagnosis not present

## 2020-10-29 DIAGNOSIS — I639 Cerebral infarction, unspecified: Secondary | ICD-10-CM | POA: Diagnosis not present

## 2020-10-29 DIAGNOSIS — I482 Chronic atrial fibrillation, unspecified: Secondary | ICD-10-CM | POA: Diagnosis not present

## 2020-10-29 DIAGNOSIS — J811 Chronic pulmonary edema: Secondary | ICD-10-CM | POA: Diagnosis not present

## 2020-10-29 DIAGNOSIS — E119 Type 2 diabetes mellitus without complications: Secondary | ICD-10-CM | POA: Diagnosis not present

## 2020-10-29 DIAGNOSIS — I2510 Atherosclerotic heart disease of native coronary artery without angina pectoris: Secondary | ICD-10-CM | POA: Diagnosis not present

## 2020-10-29 DIAGNOSIS — I361 Nonrheumatic tricuspid (valve) insufficiency: Secondary | ICD-10-CM | POA: Diagnosis not present

## 2020-10-29 DIAGNOSIS — R29702 NIHSS score 2: Secondary | ICD-10-CM | POA: Diagnosis not present

## 2020-10-29 DIAGNOSIS — Z9114 Patient's other noncompliance with medication regimen: Secondary | ICD-10-CM | POA: Diagnosis not present

## 2020-10-29 DIAGNOSIS — E785 Hyperlipidemia, unspecified: Secondary | ICD-10-CM | POA: Diagnosis not present

## 2020-10-29 DIAGNOSIS — I6521 Occlusion and stenosis of right carotid artery: Secondary | ICD-10-CM | POA: Diagnosis not present

## 2020-10-29 DIAGNOSIS — I10 Essential (primary) hypertension: Secondary | ICD-10-CM | POA: Diagnosis not present

## 2020-10-29 DIAGNOSIS — J90 Pleural effusion, not elsewhere classified: Secondary | ICD-10-CM | POA: Diagnosis not present

## 2020-10-29 DIAGNOSIS — G939 Disorder of brain, unspecified: Secondary | ICD-10-CM | POA: Diagnosis not present

## 2020-10-29 DIAGNOSIS — R220 Localized swelling, mass and lump, head: Secondary | ICD-10-CM | POA: Diagnosis not present

## 2020-10-29 DIAGNOSIS — I63211 Cerebral infarction due to unspecified occlusion or stenosis of right vertebral arteries: Secondary | ICD-10-CM | POA: Diagnosis not present

## 2020-10-29 DIAGNOSIS — I739 Peripheral vascular disease, unspecified: Secondary | ICD-10-CM | POA: Diagnosis not present

## 2020-10-29 DIAGNOSIS — M1990 Unspecified osteoarthritis, unspecified site: Secondary | ICD-10-CM | POA: Diagnosis not present

## 2020-10-29 DIAGNOSIS — J440 Chronic obstructive pulmonary disease with acute lower respiratory infection: Secondary | ICD-10-CM | POA: Diagnosis not present

## 2020-10-29 DIAGNOSIS — J449 Chronic obstructive pulmonary disease, unspecified: Secondary | ICD-10-CM | POA: Diagnosis not present

## 2020-10-29 DIAGNOSIS — H538 Other visual disturbances: Secondary | ICD-10-CM | POA: Diagnosis not present

## 2020-10-29 DIAGNOSIS — J9811 Atelectasis: Secondary | ICD-10-CM | POA: Diagnosis not present

## 2020-10-29 DIAGNOSIS — I708 Atherosclerosis of other arteries: Secondary | ICD-10-CM | POA: Diagnosis not present

## 2020-10-29 DIAGNOSIS — R059 Cough, unspecified: Secondary | ICD-10-CM | POA: Diagnosis not present

## 2020-10-29 DIAGNOSIS — R296 Repeated falls: Secondary | ICD-10-CM | POA: Diagnosis not present

## 2020-10-30 DIAGNOSIS — I672 Cerebral atherosclerosis: Secondary | ICD-10-CM | POA: Diagnosis not present

## 2020-10-30 DIAGNOSIS — I63211 Cerebral infarction due to unspecified occlusion or stenosis of right vertebral arteries: Secondary | ICD-10-CM | POA: Diagnosis not present

## 2020-10-30 DIAGNOSIS — I6503 Occlusion and stenosis of bilateral vertebral arteries: Secondary | ICD-10-CM | POA: Diagnosis not present

## 2020-10-30 DIAGNOSIS — I708 Atherosclerosis of other arteries: Secondary | ICD-10-CM | POA: Diagnosis not present

## 2020-10-30 DIAGNOSIS — M26602 Left temporomandibular joint disorder, unspecified: Secondary | ICD-10-CM | POA: Diagnosis not present

## 2020-11-01 DIAGNOSIS — R220 Localized swelling, mass and lump, head: Secondary | ICD-10-CM | POA: Diagnosis not present

## 2020-11-01 DIAGNOSIS — I639 Cerebral infarction, unspecified: Secondary | ICD-10-CM | POA: Diagnosis not present

## 2020-11-01 DIAGNOSIS — G939 Disorder of brain, unspecified: Secondary | ICD-10-CM | POA: Diagnosis not present

## 2020-11-10 DIAGNOSIS — Z7984 Long term (current) use of oral hypoglycemic drugs: Secondary | ICD-10-CM | POA: Diagnosis not present

## 2020-11-10 DIAGNOSIS — J449 Chronic obstructive pulmonary disease, unspecified: Secondary | ICD-10-CM | POA: Diagnosis not present

## 2020-11-10 DIAGNOSIS — F32A Depression, unspecified: Secondary | ICD-10-CM | POA: Diagnosis not present

## 2020-11-10 DIAGNOSIS — L84 Corns and callosities: Secondary | ICD-10-CM | POA: Diagnosis not present

## 2020-11-10 DIAGNOSIS — H5340 Unspecified visual field defects: Secondary | ICD-10-CM | POA: Diagnosis not present

## 2020-11-10 DIAGNOSIS — M159 Polyosteoarthritis, unspecified: Secondary | ICD-10-CM | POA: Diagnosis not present

## 2020-11-10 DIAGNOSIS — I2510 Atherosclerotic heart disease of native coronary artery without angina pectoris: Secondary | ICD-10-CM | POA: Diagnosis not present

## 2020-11-10 DIAGNOSIS — I252 Old myocardial infarction: Secondary | ICD-10-CM | POA: Diagnosis not present

## 2020-11-10 DIAGNOSIS — E1151 Type 2 diabetes mellitus with diabetic peripheral angiopathy without gangrene: Secondary | ICD-10-CM | POA: Diagnosis not present

## 2020-11-10 DIAGNOSIS — E1140 Type 2 diabetes mellitus with diabetic neuropathy, unspecified: Secondary | ICD-10-CM | POA: Diagnosis not present

## 2020-11-10 DIAGNOSIS — Z7901 Long term (current) use of anticoagulants: Secondary | ICD-10-CM | POA: Diagnosis not present

## 2020-11-10 DIAGNOSIS — J309 Allergic rhinitis, unspecified: Secondary | ICD-10-CM | POA: Diagnosis not present

## 2020-11-10 DIAGNOSIS — I69398 Other sequelae of cerebral infarction: Secondary | ICD-10-CM | POA: Diagnosis not present

## 2020-11-10 DIAGNOSIS — E559 Vitamin D deficiency, unspecified: Secondary | ICD-10-CM | POA: Diagnosis not present

## 2020-11-10 DIAGNOSIS — I4891 Unspecified atrial fibrillation: Secondary | ICD-10-CM | POA: Diagnosis not present

## 2020-11-10 DIAGNOSIS — M8580 Other specified disorders of bone density and structure, unspecified site: Secondary | ICD-10-CM | POA: Diagnosis not present

## 2020-11-10 DIAGNOSIS — G4700 Insomnia, unspecified: Secondary | ICD-10-CM | POA: Diagnosis not present

## 2020-11-10 DIAGNOSIS — G8929 Other chronic pain: Secondary | ICD-10-CM | POA: Diagnosis not present

## 2020-11-10 DIAGNOSIS — I951 Orthostatic hypotension: Secondary | ICD-10-CM | POA: Diagnosis not present

## 2020-11-10 DIAGNOSIS — Z9181 History of falling: Secondary | ICD-10-CM | POA: Diagnosis not present

## 2020-11-10 DIAGNOSIS — E871 Hypo-osmolality and hyponatremia: Secondary | ICD-10-CM | POA: Diagnosis not present

## 2020-11-10 DIAGNOSIS — K219 Gastro-esophageal reflux disease without esophagitis: Secondary | ICD-10-CM | POA: Diagnosis not present

## 2020-11-12 DIAGNOSIS — Z7901 Long term (current) use of anticoagulants: Secondary | ICD-10-CM | POA: Diagnosis not present

## 2020-11-12 DIAGNOSIS — M159 Polyosteoarthritis, unspecified: Secondary | ICD-10-CM | POA: Diagnosis not present

## 2020-11-12 DIAGNOSIS — E1140 Type 2 diabetes mellitus with diabetic neuropathy, unspecified: Secondary | ICD-10-CM | POA: Diagnosis not present

## 2020-11-12 DIAGNOSIS — J309 Allergic rhinitis, unspecified: Secondary | ICD-10-CM | POA: Diagnosis not present

## 2020-11-12 DIAGNOSIS — I951 Orthostatic hypotension: Secondary | ICD-10-CM | POA: Diagnosis not present

## 2020-11-12 DIAGNOSIS — M8580 Other specified disorders of bone density and structure, unspecified site: Secondary | ICD-10-CM | POA: Diagnosis not present

## 2020-11-12 DIAGNOSIS — Z7984 Long term (current) use of oral hypoglycemic drugs: Secondary | ICD-10-CM | POA: Diagnosis not present

## 2020-11-12 DIAGNOSIS — I4891 Unspecified atrial fibrillation: Secondary | ICD-10-CM | POA: Diagnosis not present

## 2020-11-12 DIAGNOSIS — L84 Corns and callosities: Secondary | ICD-10-CM | POA: Diagnosis not present

## 2020-11-12 DIAGNOSIS — E1151 Type 2 diabetes mellitus with diabetic peripheral angiopathy without gangrene: Secondary | ICD-10-CM | POA: Diagnosis not present

## 2020-11-12 DIAGNOSIS — I2510 Atherosclerotic heart disease of native coronary artery without angina pectoris: Secondary | ICD-10-CM | POA: Diagnosis not present

## 2020-11-12 DIAGNOSIS — G8929 Other chronic pain: Secondary | ICD-10-CM | POA: Diagnosis not present

## 2020-11-12 DIAGNOSIS — J449 Chronic obstructive pulmonary disease, unspecified: Secondary | ICD-10-CM | POA: Diagnosis not present

## 2020-11-12 DIAGNOSIS — G4700 Insomnia, unspecified: Secondary | ICD-10-CM | POA: Diagnosis not present

## 2020-11-12 DIAGNOSIS — Z9181 History of falling: Secondary | ICD-10-CM | POA: Diagnosis not present

## 2020-11-12 DIAGNOSIS — F32A Depression, unspecified: Secondary | ICD-10-CM | POA: Diagnosis not present

## 2020-11-12 DIAGNOSIS — K219 Gastro-esophageal reflux disease without esophagitis: Secondary | ICD-10-CM | POA: Diagnosis not present

## 2020-11-12 DIAGNOSIS — I69398 Other sequelae of cerebral infarction: Secondary | ICD-10-CM | POA: Diagnosis not present

## 2020-11-12 DIAGNOSIS — E871 Hypo-osmolality and hyponatremia: Secondary | ICD-10-CM | POA: Diagnosis not present

## 2020-11-12 DIAGNOSIS — I252 Old myocardial infarction: Secondary | ICD-10-CM | POA: Diagnosis not present

## 2020-11-12 DIAGNOSIS — E559 Vitamin D deficiency, unspecified: Secondary | ICD-10-CM | POA: Diagnosis not present

## 2020-11-12 DIAGNOSIS — H5340 Unspecified visual field defects: Secondary | ICD-10-CM | POA: Diagnosis not present

## 2020-11-13 DIAGNOSIS — G894 Chronic pain syndrome: Secondary | ICD-10-CM | POA: Diagnosis not present

## 2020-11-13 DIAGNOSIS — M545 Low back pain, unspecified: Secondary | ICD-10-CM | POA: Diagnosis not present

## 2020-11-13 DIAGNOSIS — E113393 Type 2 diabetes mellitus with moderate nonproliferative diabetic retinopathy without macular edema, bilateral: Secondary | ICD-10-CM | POA: Diagnosis not present

## 2020-11-13 DIAGNOSIS — H35443 Age-related reticular degeneration of retina, bilateral: Secondary | ICD-10-CM | POA: Diagnosis not present

## 2020-11-13 DIAGNOSIS — H35013 Changes in retinal vascular appearance, bilateral: Secondary | ICD-10-CM | POA: Diagnosis not present

## 2020-11-13 DIAGNOSIS — M4326 Fusion of spine, lumbar region: Secondary | ICD-10-CM | POA: Diagnosis not present

## 2020-11-13 DIAGNOSIS — M47816 Spondylosis without myelopathy or radiculopathy, lumbar region: Secondary | ICD-10-CM | POA: Diagnosis not present

## 2020-11-14 DIAGNOSIS — Z09 Encounter for follow-up examination after completed treatment for conditions other than malignant neoplasm: Secondary | ICD-10-CM | POA: Diagnosis not present

## 2020-11-14 DIAGNOSIS — Z79899 Other long term (current) drug therapy: Secondary | ICD-10-CM | POA: Diagnosis not present

## 2020-11-14 DIAGNOSIS — R911 Solitary pulmonary nodule: Secondary | ICD-10-CM | POA: Diagnosis not present

## 2020-11-14 DIAGNOSIS — H539 Unspecified visual disturbance: Secondary | ICD-10-CM | POA: Diagnosis not present

## 2020-11-14 DIAGNOSIS — Z8673 Personal history of transient ischemic attack (TIA), and cerebral infarction without residual deficits: Secondary | ICD-10-CM | POA: Diagnosis not present

## 2020-11-15 DIAGNOSIS — G8929 Other chronic pain: Secondary | ICD-10-CM | POA: Diagnosis not present

## 2020-11-15 DIAGNOSIS — I4891 Unspecified atrial fibrillation: Secondary | ICD-10-CM | POA: Diagnosis not present

## 2020-11-15 DIAGNOSIS — I951 Orthostatic hypotension: Secondary | ICD-10-CM | POA: Diagnosis not present

## 2020-11-15 DIAGNOSIS — K219 Gastro-esophageal reflux disease without esophagitis: Secondary | ICD-10-CM | POA: Diagnosis not present

## 2020-11-15 DIAGNOSIS — I69398 Other sequelae of cerebral infarction: Secondary | ICD-10-CM | POA: Diagnosis not present

## 2020-11-15 DIAGNOSIS — M159 Polyosteoarthritis, unspecified: Secondary | ICD-10-CM | POA: Diagnosis not present

## 2020-11-15 DIAGNOSIS — E559 Vitamin D deficiency, unspecified: Secondary | ICD-10-CM | POA: Diagnosis not present

## 2020-11-15 DIAGNOSIS — I2510 Atherosclerotic heart disease of native coronary artery without angina pectoris: Secondary | ICD-10-CM | POA: Diagnosis not present

## 2020-11-15 DIAGNOSIS — E1151 Type 2 diabetes mellitus with diabetic peripheral angiopathy without gangrene: Secondary | ICD-10-CM | POA: Diagnosis not present

## 2020-11-15 DIAGNOSIS — J449 Chronic obstructive pulmonary disease, unspecified: Secondary | ICD-10-CM | POA: Diagnosis not present

## 2020-11-15 DIAGNOSIS — Z7984 Long term (current) use of oral hypoglycemic drugs: Secondary | ICD-10-CM | POA: Diagnosis not present

## 2020-11-15 DIAGNOSIS — F32A Depression, unspecified: Secondary | ICD-10-CM | POA: Diagnosis not present

## 2020-11-15 DIAGNOSIS — E871 Hypo-osmolality and hyponatremia: Secondary | ICD-10-CM | POA: Diagnosis not present

## 2020-11-15 DIAGNOSIS — E1140 Type 2 diabetes mellitus with diabetic neuropathy, unspecified: Secondary | ICD-10-CM | POA: Diagnosis not present

## 2020-11-15 DIAGNOSIS — H5340 Unspecified visual field defects: Secondary | ICD-10-CM | POA: Diagnosis not present

## 2020-11-15 DIAGNOSIS — M8580 Other specified disorders of bone density and structure, unspecified site: Secondary | ICD-10-CM | POA: Diagnosis not present

## 2020-11-15 DIAGNOSIS — I252 Old myocardial infarction: Secondary | ICD-10-CM | POA: Diagnosis not present

## 2020-11-15 DIAGNOSIS — J309 Allergic rhinitis, unspecified: Secondary | ICD-10-CM | POA: Diagnosis not present

## 2020-11-15 DIAGNOSIS — Z9181 History of falling: Secondary | ICD-10-CM | POA: Diagnosis not present

## 2020-11-15 DIAGNOSIS — Z7901 Long term (current) use of anticoagulants: Secondary | ICD-10-CM | POA: Diagnosis not present

## 2020-11-15 DIAGNOSIS — L84 Corns and callosities: Secondary | ICD-10-CM | POA: Diagnosis not present

## 2020-11-15 DIAGNOSIS — G4700 Insomnia, unspecified: Secondary | ICD-10-CM | POA: Diagnosis not present

## 2020-11-20 DIAGNOSIS — I63541 Cerebral infarction due to unspecified occlusion or stenosis of right cerebellar artery: Secondary | ICD-10-CM | POA: Diagnosis not present

## 2020-11-20 DIAGNOSIS — E785 Hyperlipidemia, unspecified: Secondary | ICD-10-CM | POA: Diagnosis not present

## 2020-11-20 DIAGNOSIS — R404 Transient alteration of awareness: Secondary | ICD-10-CM | POA: Diagnosis not present

## 2020-11-20 DIAGNOSIS — M25519 Pain in unspecified shoulder: Secondary | ICD-10-CM | POA: Diagnosis not present

## 2020-11-20 DIAGNOSIS — R0902 Hypoxemia: Secondary | ICD-10-CM | POA: Diagnosis not present

## 2020-11-20 DIAGNOSIS — I63531 Cerebral infarction due to unspecified occlusion or stenosis of right posterior cerebral artery: Secondary | ICD-10-CM

## 2020-11-20 DIAGNOSIS — I6521 Occlusion and stenosis of right carotid artery: Secondary | ICD-10-CM | POA: Diagnosis not present

## 2020-11-20 DIAGNOSIS — Z79899 Other long term (current) drug therapy: Secondary | ICD-10-CM | POA: Diagnosis not present

## 2020-11-20 DIAGNOSIS — W19XXXA Unspecified fall, initial encounter: Secondary | ICD-10-CM | POA: Diagnosis not present

## 2020-11-20 DIAGNOSIS — I6523 Occlusion and stenosis of bilateral carotid arteries: Secondary | ICD-10-CM | POA: Diagnosis not present

## 2020-11-20 DIAGNOSIS — F17200 Nicotine dependence, unspecified, uncomplicated: Secondary | ICD-10-CM | POA: Diagnosis not present

## 2020-11-20 DIAGNOSIS — Z7984 Long term (current) use of oral hypoglycemic drugs: Secondary | ICD-10-CM | POA: Diagnosis not present

## 2020-11-20 DIAGNOSIS — I10 Essential (primary) hypertension: Secondary | ICD-10-CM | POA: Diagnosis not present

## 2020-11-20 DIAGNOSIS — I671 Cerebral aneurysm, nonruptured: Secondary | ICD-10-CM | POA: Diagnosis not present

## 2020-11-20 DIAGNOSIS — I639 Cerebral infarction, unspecified: Secondary | ICD-10-CM | POA: Diagnosis not present

## 2020-11-20 DIAGNOSIS — R1310 Dysphagia, unspecified: Secondary | ICD-10-CM | POA: Diagnosis not present

## 2020-11-20 DIAGNOSIS — Z86718 Personal history of other venous thrombosis and embolism: Secondary | ICD-10-CM | POA: Diagnosis not present

## 2020-11-20 DIAGNOSIS — Z8673 Personal history of transient ischemic attack (TIA), and cerebral infarction without residual deficits: Secondary | ICD-10-CM | POA: Diagnosis not present

## 2020-11-20 DIAGNOSIS — Z794 Long term (current) use of insulin: Secondary | ICD-10-CM | POA: Diagnosis not present

## 2020-11-20 DIAGNOSIS — R262 Difficulty in walking, not elsewhere classified: Secondary | ICD-10-CM | POA: Diagnosis not present

## 2020-11-20 DIAGNOSIS — R0602 Shortness of breath: Secondary | ICD-10-CM | POA: Diagnosis not present

## 2020-11-20 DIAGNOSIS — F17210 Nicotine dependence, cigarettes, uncomplicated: Secondary | ICD-10-CM | POA: Diagnosis not present

## 2020-11-20 DIAGNOSIS — J449 Chronic obstructive pulmonary disease, unspecified: Secondary | ICD-10-CM | POA: Diagnosis not present

## 2020-11-20 DIAGNOSIS — Z86711 Personal history of pulmonary embolism: Secondary | ICD-10-CM | POA: Diagnosis not present

## 2020-11-20 DIAGNOSIS — Z7901 Long term (current) use of anticoagulants: Secondary | ICD-10-CM | POA: Diagnosis not present

## 2020-11-20 DIAGNOSIS — E119 Type 2 diabetes mellitus without complications: Secondary | ICD-10-CM | POA: Diagnosis not present

## 2020-11-20 HISTORY — DX: Cerebral infarction due to unspecified occlusion or stenosis of right posterior cerebral artery: I63.531

## 2020-11-21 DIAGNOSIS — M79622 Pain in left upper arm: Secondary | ICD-10-CM | POA: Diagnosis not present

## 2020-11-21 DIAGNOSIS — J449 Chronic obstructive pulmonary disease, unspecified: Secondary | ICD-10-CM | POA: Diagnosis not present

## 2020-11-21 DIAGNOSIS — G319 Degenerative disease of nervous system, unspecified: Secondary | ICD-10-CM | POA: Diagnosis not present

## 2020-11-21 DIAGNOSIS — I672 Cerebral atherosclerosis: Secondary | ICD-10-CM | POA: Diagnosis not present

## 2020-11-21 DIAGNOSIS — I6389 Other cerebral infarction: Secondary | ICD-10-CM | POA: Diagnosis not present

## 2020-11-21 DIAGNOSIS — I639 Cerebral infarction, unspecified: Secondary | ICD-10-CM | POA: Diagnosis not present

## 2020-11-21 DIAGNOSIS — Z043 Encounter for examination and observation following other accident: Secondary | ICD-10-CM | POA: Diagnosis not present

## 2020-11-21 DIAGNOSIS — I6521 Occlusion and stenosis of right carotid artery: Secondary | ICD-10-CM | POA: Diagnosis not present

## 2020-11-21 DIAGNOSIS — F17200 Nicotine dependence, unspecified, uncomplicated: Secondary | ICD-10-CM | POA: Diagnosis not present

## 2020-11-21 DIAGNOSIS — Z86711 Personal history of pulmonary embolism: Secondary | ICD-10-CM | POA: Diagnosis not present

## 2020-11-21 DIAGNOSIS — R000 Tachycardia, unspecified: Secondary | ICD-10-CM | POA: Diagnosis not present

## 2020-11-21 DIAGNOSIS — M25512 Pain in left shoulder: Secondary | ICD-10-CM | POA: Diagnosis not present

## 2020-11-21 DIAGNOSIS — I371 Nonrheumatic pulmonary valve insufficiency: Secondary | ICD-10-CM | POA: Diagnosis not present

## 2020-11-21 DIAGNOSIS — I6523 Occlusion and stenosis of bilateral carotid arteries: Secondary | ICD-10-CM | POA: Diagnosis not present

## 2020-11-21 DIAGNOSIS — I361 Nonrheumatic tricuspid (valve) insufficiency: Secondary | ICD-10-CM | POA: Diagnosis not present

## 2020-11-21 DIAGNOSIS — Z794 Long term (current) use of insulin: Secondary | ICD-10-CM | POA: Diagnosis not present

## 2020-11-21 DIAGNOSIS — E119 Type 2 diabetes mellitus without complications: Secondary | ICD-10-CM | POA: Diagnosis not present

## 2020-11-21 DIAGNOSIS — I6621 Occlusion and stenosis of right posterior cerebral artery: Secondary | ICD-10-CM | POA: Diagnosis not present

## 2020-11-21 DIAGNOSIS — I10 Essential (primary) hypertension: Secondary | ICD-10-CM | POA: Diagnosis not present

## 2020-11-21 DIAGNOSIS — E785 Hyperlipidemia, unspecified: Secondary | ICD-10-CM | POA: Diagnosis not present

## 2020-11-21 DIAGNOSIS — I63531 Cerebral infarction due to unspecified occlusion or stenosis of right posterior cerebral artery: Secondary | ICD-10-CM | POA: Diagnosis not present

## 2020-11-22 DIAGNOSIS — E119 Type 2 diabetes mellitus without complications: Secondary | ICD-10-CM | POA: Diagnosis not present

## 2020-11-22 DIAGNOSIS — I639 Cerebral infarction, unspecified: Secondary | ICD-10-CM | POA: Diagnosis not present

## 2020-11-22 DIAGNOSIS — Z86711 Personal history of pulmonary embolism: Secondary | ICD-10-CM | POA: Diagnosis not present

## 2020-11-22 DIAGNOSIS — E785 Hyperlipidemia, unspecified: Secondary | ICD-10-CM | POA: Diagnosis not present

## 2020-11-22 DIAGNOSIS — I10 Essential (primary) hypertension: Secondary | ICD-10-CM | POA: Diagnosis not present

## 2020-11-22 DIAGNOSIS — I63531 Cerebral infarction due to unspecified occlusion or stenosis of right posterior cerebral artery: Secondary | ICD-10-CM | POA: Diagnosis not present

## 2020-11-22 DIAGNOSIS — J449 Chronic obstructive pulmonary disease, unspecified: Secondary | ICD-10-CM | POA: Diagnosis not present

## 2020-11-22 DIAGNOSIS — Z794 Long term (current) use of insulin: Secondary | ICD-10-CM | POA: Diagnosis not present

## 2020-11-22 DIAGNOSIS — I6523 Occlusion and stenosis of bilateral carotid arteries: Secondary | ICD-10-CM | POA: Diagnosis not present

## 2020-11-22 DIAGNOSIS — F17200 Nicotine dependence, unspecified, uncomplicated: Secondary | ICD-10-CM | POA: Diagnosis not present

## 2020-11-23 DIAGNOSIS — I4891 Unspecified atrial fibrillation: Secondary | ICD-10-CM | POA: Diagnosis not present

## 2020-11-23 DIAGNOSIS — Z7984 Long term (current) use of oral hypoglycemic drugs: Secondary | ICD-10-CM | POA: Diagnosis not present

## 2020-11-23 DIAGNOSIS — H5340 Unspecified visual field defects: Secondary | ICD-10-CM | POA: Diagnosis not present

## 2020-11-23 DIAGNOSIS — I951 Orthostatic hypotension: Secondary | ICD-10-CM | POA: Diagnosis not present

## 2020-11-23 DIAGNOSIS — G4700 Insomnia, unspecified: Secondary | ICD-10-CM | POA: Diagnosis not present

## 2020-11-23 DIAGNOSIS — M159 Polyosteoarthritis, unspecified: Secondary | ICD-10-CM | POA: Diagnosis not present

## 2020-11-23 DIAGNOSIS — F32A Depression, unspecified: Secondary | ICD-10-CM | POA: Diagnosis not present

## 2020-11-23 DIAGNOSIS — G8929 Other chronic pain: Secondary | ICD-10-CM | POA: Diagnosis not present

## 2020-11-23 DIAGNOSIS — E1140 Type 2 diabetes mellitus with diabetic neuropathy, unspecified: Secondary | ICD-10-CM | POA: Diagnosis not present

## 2020-11-23 DIAGNOSIS — J449 Chronic obstructive pulmonary disease, unspecified: Secondary | ICD-10-CM | POA: Diagnosis not present

## 2020-11-23 DIAGNOSIS — I69398 Other sequelae of cerebral infarction: Secondary | ICD-10-CM | POA: Diagnosis not present

## 2020-11-23 DIAGNOSIS — E559 Vitamin D deficiency, unspecified: Secondary | ICD-10-CM | POA: Diagnosis not present

## 2020-11-23 DIAGNOSIS — E871 Hypo-osmolality and hyponatremia: Secondary | ICD-10-CM | POA: Diagnosis not present

## 2020-11-23 DIAGNOSIS — L84 Corns and callosities: Secondary | ICD-10-CM | POA: Diagnosis not present

## 2020-11-23 DIAGNOSIS — J309 Allergic rhinitis, unspecified: Secondary | ICD-10-CM | POA: Diagnosis not present

## 2020-11-23 DIAGNOSIS — I2510 Atherosclerotic heart disease of native coronary artery without angina pectoris: Secondary | ICD-10-CM | POA: Diagnosis not present

## 2020-11-23 DIAGNOSIS — Z9181 History of falling: Secondary | ICD-10-CM | POA: Diagnosis not present

## 2020-11-23 DIAGNOSIS — I252 Old myocardial infarction: Secondary | ICD-10-CM | POA: Diagnosis not present

## 2020-11-23 DIAGNOSIS — K219 Gastro-esophageal reflux disease without esophagitis: Secondary | ICD-10-CM | POA: Diagnosis not present

## 2020-11-23 DIAGNOSIS — M8580 Other specified disorders of bone density and structure, unspecified site: Secondary | ICD-10-CM | POA: Diagnosis not present

## 2020-11-23 DIAGNOSIS — E1151 Type 2 diabetes mellitus with diabetic peripheral angiopathy without gangrene: Secondary | ICD-10-CM | POA: Diagnosis not present

## 2020-11-23 DIAGNOSIS — Z7901 Long term (current) use of anticoagulants: Secondary | ICD-10-CM | POA: Diagnosis not present

## 2020-11-26 DIAGNOSIS — Z01818 Encounter for other preprocedural examination: Secondary | ICD-10-CM | POA: Diagnosis not present

## 2020-11-26 DIAGNOSIS — I6521 Occlusion and stenosis of right carotid artery: Secondary | ICD-10-CM | POA: Diagnosis not present

## 2020-11-27 DIAGNOSIS — I6521 Occlusion and stenosis of right carotid artery: Secondary | ICD-10-CM | POA: Diagnosis not present

## 2020-11-27 DIAGNOSIS — I739 Peripheral vascular disease, unspecified: Secondary | ICD-10-CM | POA: Diagnosis not present

## 2020-11-28 DIAGNOSIS — E1151 Type 2 diabetes mellitus with diabetic peripheral angiopathy without gangrene: Secondary | ICD-10-CM | POA: Diagnosis not present

## 2020-11-28 DIAGNOSIS — E871 Hypo-osmolality and hyponatremia: Secondary | ICD-10-CM | POA: Diagnosis not present

## 2020-11-28 DIAGNOSIS — M8580 Other specified disorders of bone density and structure, unspecified site: Secondary | ICD-10-CM | POA: Diagnosis not present

## 2020-11-28 DIAGNOSIS — I69398 Other sequelae of cerebral infarction: Secondary | ICD-10-CM | POA: Diagnosis not present

## 2020-11-28 DIAGNOSIS — K219 Gastro-esophageal reflux disease without esophagitis: Secondary | ICD-10-CM | POA: Diagnosis not present

## 2020-11-28 DIAGNOSIS — E1140 Type 2 diabetes mellitus with diabetic neuropathy, unspecified: Secondary | ICD-10-CM | POA: Diagnosis not present

## 2020-11-28 DIAGNOSIS — J440 Chronic obstructive pulmonary disease with acute lower respiratory infection: Secondary | ICD-10-CM | POA: Diagnosis not present

## 2020-11-28 DIAGNOSIS — H5340 Unspecified visual field defects: Secondary | ICD-10-CM | POA: Diagnosis not present

## 2020-11-28 DIAGNOSIS — J309 Allergic rhinitis, unspecified: Secondary | ICD-10-CM | POA: Diagnosis not present

## 2020-11-28 DIAGNOSIS — I2510 Atherosclerotic heart disease of native coronary artery without angina pectoris: Secondary | ICD-10-CM | POA: Diagnosis not present

## 2020-11-28 DIAGNOSIS — I252 Old myocardial infarction: Secondary | ICD-10-CM | POA: Diagnosis not present

## 2020-11-28 DIAGNOSIS — I951 Orthostatic hypotension: Secondary | ICD-10-CM | POA: Diagnosis not present

## 2020-11-28 DIAGNOSIS — Z7901 Long term (current) use of anticoagulants: Secondary | ICD-10-CM | POA: Diagnosis not present

## 2020-11-28 DIAGNOSIS — J449 Chronic obstructive pulmonary disease, unspecified: Secondary | ICD-10-CM | POA: Diagnosis not present

## 2020-11-28 DIAGNOSIS — E559 Vitamin D deficiency, unspecified: Secondary | ICD-10-CM | POA: Diagnosis not present

## 2020-11-28 DIAGNOSIS — I4891 Unspecified atrial fibrillation: Secondary | ICD-10-CM | POA: Diagnosis not present

## 2020-11-28 DIAGNOSIS — J019 Acute sinusitis, unspecified: Secondary | ICD-10-CM | POA: Diagnosis not present

## 2020-11-28 DIAGNOSIS — G4700 Insomnia, unspecified: Secondary | ICD-10-CM | POA: Diagnosis not present

## 2020-11-28 DIAGNOSIS — L84 Corns and callosities: Secondary | ICD-10-CM | POA: Diagnosis not present

## 2020-11-28 DIAGNOSIS — Z7984 Long term (current) use of oral hypoglycemic drugs: Secondary | ICD-10-CM | POA: Diagnosis not present

## 2020-11-28 DIAGNOSIS — F32A Depression, unspecified: Secondary | ICD-10-CM | POA: Diagnosis not present

## 2020-11-28 DIAGNOSIS — G8929 Other chronic pain: Secondary | ICD-10-CM | POA: Diagnosis not present

## 2020-11-28 DIAGNOSIS — Z9181 History of falling: Secondary | ICD-10-CM | POA: Diagnosis not present

## 2020-11-28 DIAGNOSIS — J209 Acute bronchitis, unspecified: Secondary | ICD-10-CM | POA: Diagnosis not present

## 2020-11-28 DIAGNOSIS — M159 Polyosteoarthritis, unspecified: Secondary | ICD-10-CM | POA: Diagnosis not present

## 2020-12-02 ENCOUNTER — Encounter (HOSPITAL_COMMUNITY): Payer: Self-pay | Admitting: Radiology

## 2020-12-02 ENCOUNTER — Emergency Department (HOSPITAL_COMMUNITY): Payer: Medicare Other

## 2020-12-02 ENCOUNTER — Inpatient Hospital Stay (HOSPITAL_COMMUNITY)
Admission: EM | Admit: 2020-12-02 | Discharge: 2020-12-06 | DRG: 035 | Disposition: A | Discharge status: Home / Self Care | Payer: Medicare Other | Attending: Internal Medicine | Admitting: Internal Medicine

## 2020-12-02 ENCOUNTER — Other Ambulatory Visit: Payer: Self-pay

## 2020-12-02 ENCOUNTER — Inpatient Hospital Stay (HOSPITAL_COMMUNITY): Payer: Medicare Other

## 2020-12-02 DIAGNOSIS — E785 Hyperlipidemia, unspecified: Secondary | ICD-10-CM | POA: Diagnosis present

## 2020-12-02 DIAGNOSIS — Z91030 Bee allergy status: Secondary | ICD-10-CM | POA: Diagnosis not present

## 2020-12-02 DIAGNOSIS — I639 Cerebral infarction, unspecified: Secondary | ICD-10-CM

## 2020-12-02 DIAGNOSIS — Z91010 Allergy to peanuts: Secondary | ICD-10-CM

## 2020-12-02 DIAGNOSIS — F419 Anxiety disorder, unspecified: Secondary | ICD-10-CM | POA: Diagnosis present

## 2020-12-02 DIAGNOSIS — G8194 Hemiplegia, unspecified affecting left nondominant side: Secondary | ICD-10-CM | POA: Diagnosis not present

## 2020-12-02 DIAGNOSIS — R911 Solitary pulmonary nodule: Secondary | ICD-10-CM | POA: Diagnosis not present

## 2020-12-02 DIAGNOSIS — R471 Dysarthria and anarthria: Secondary | ICD-10-CM | POA: Diagnosis present

## 2020-12-02 DIAGNOSIS — R0902 Hypoxemia: Secondary | ICD-10-CM | POA: Diagnosis not present

## 2020-12-02 DIAGNOSIS — R4701 Aphasia: Secondary | ICD-10-CM | POA: Diagnosis present

## 2020-12-02 DIAGNOSIS — E875 Hyperkalemia: Secondary | ICD-10-CM

## 2020-12-02 DIAGNOSIS — Z955 Presence of coronary angioplasty implant and graft: Secondary | ICD-10-CM

## 2020-12-02 DIAGNOSIS — E1140 Type 2 diabetes mellitus with diabetic neuropathy, unspecified: Secondary | ICD-10-CM | POA: Diagnosis present

## 2020-12-02 DIAGNOSIS — I69398 Other sequelae of cerebral infarction: Secondary | ICD-10-CM

## 2020-12-02 DIAGNOSIS — Z6835 Body mass index (BMI) 35.0-35.9, adult: Secondary | ICD-10-CM

## 2020-12-02 DIAGNOSIS — I959 Hypotension, unspecified: Secondary | ICD-10-CM | POA: Diagnosis not present

## 2020-12-02 DIAGNOSIS — E1151 Type 2 diabetes mellitus with diabetic peripheral angiopathy without gangrene: Secondary | ICD-10-CM | POA: Diagnosis present

## 2020-12-02 DIAGNOSIS — Z7984 Long term (current) use of oral hypoglycemic drugs: Secondary | ICD-10-CM | POA: Diagnosis not present

## 2020-12-02 DIAGNOSIS — F17210 Nicotine dependence, cigarettes, uncomplicated: Secondary | ICD-10-CM | POA: Diagnosis not present

## 2020-12-02 DIAGNOSIS — W06XXXA Fall from bed, initial encounter: Secondary | ICD-10-CM | POA: Diagnosis present

## 2020-12-02 DIAGNOSIS — R4781 Slurred speech: Secondary | ICD-10-CM | POA: Diagnosis not present

## 2020-12-02 DIAGNOSIS — W109XXA Fall (on) (from) unspecified stairs and steps, initial encounter: Secondary | ICD-10-CM | POA: Diagnosis present

## 2020-12-02 DIAGNOSIS — Z7982 Long term (current) use of aspirin: Secondary | ICD-10-CM

## 2020-12-02 DIAGNOSIS — R29810 Facial weakness: Secondary | ICD-10-CM | POA: Diagnosis present

## 2020-12-02 DIAGNOSIS — D72829 Elevated white blood cell count, unspecified: Secondary | ICD-10-CM | POA: Diagnosis present

## 2020-12-02 DIAGNOSIS — K219 Gastro-esophageal reflux disease without esophagitis: Secondary | ICD-10-CM | POA: Diagnosis present

## 2020-12-02 DIAGNOSIS — Z0389 Encounter for observation for other suspected diseases and conditions ruled out: Secondary | ICD-10-CM | POA: Diagnosis not present

## 2020-12-02 DIAGNOSIS — I6502 Occlusion and stenosis of left vertebral artery: Secondary | ICD-10-CM | POA: Diagnosis not present

## 2020-12-02 DIAGNOSIS — E876 Hypokalemia: Secondary | ICD-10-CM

## 2020-12-02 DIAGNOSIS — Z79899 Other long term (current) drug therapy: Secondary | ICD-10-CM | POA: Diagnosis not present

## 2020-12-02 DIAGNOSIS — I4891 Unspecified atrial fibrillation: Secondary | ICD-10-CM | POA: Diagnosis present

## 2020-12-02 DIAGNOSIS — E088 Diabetes mellitus due to underlying condition with unspecified complications: Secondary | ICD-10-CM | POA: Diagnosis not present

## 2020-12-02 DIAGNOSIS — Z720 Tobacco use: Secondary | ICD-10-CM | POA: Diagnosis not present

## 2020-12-02 DIAGNOSIS — I6523 Occlusion and stenosis of bilateral carotid arteries: Secondary | ICD-10-CM | POA: Diagnosis not present

## 2020-12-02 DIAGNOSIS — R000 Tachycardia, unspecified: Secondary | ICD-10-CM | POA: Diagnosis not present

## 2020-12-02 DIAGNOSIS — E860 Dehydration: Secondary | ICD-10-CM | POA: Diagnosis present

## 2020-12-02 DIAGNOSIS — I63231 Cerebral infarction due to unspecified occlusion or stenosis of right carotid arteries: Secondary | ICD-10-CM | POA: Diagnosis not present

## 2020-12-02 DIAGNOSIS — Z86711 Personal history of pulmonary embolism: Secondary | ICD-10-CM | POA: Diagnosis not present

## 2020-12-02 DIAGNOSIS — I252 Old myocardial infarction: Secondary | ICD-10-CM

## 2020-12-02 DIAGNOSIS — G4733 Obstructive sleep apnea (adult) (pediatric): Secondary | ICD-10-CM | POA: Diagnosis present

## 2020-12-02 DIAGNOSIS — J449 Chronic obstructive pulmonary disease, unspecified: Secondary | ICD-10-CM | POA: Diagnosis present

## 2020-12-02 DIAGNOSIS — H53462 Homonymous bilateral field defects, left side: Secondary | ICD-10-CM | POA: Diagnosis present

## 2020-12-02 DIAGNOSIS — I10 Essential (primary) hypertension: Secondary | ICD-10-CM | POA: Diagnosis not present

## 2020-12-02 DIAGNOSIS — I517 Cardiomegaly: Secondary | ICD-10-CM | POA: Diagnosis not present

## 2020-12-02 DIAGNOSIS — Z833 Family history of diabetes mellitus: Secondary | ICD-10-CM

## 2020-12-02 DIAGNOSIS — I739 Peripheral vascular disease, unspecified: Secondary | ICD-10-CM | POA: Diagnosis present

## 2020-12-02 DIAGNOSIS — Z888 Allergy status to other drugs, medicaments and biological substances status: Secondary | ICD-10-CM

## 2020-12-02 DIAGNOSIS — Z20822 Contact with and (suspected) exposure to covid-19: Secondary | ICD-10-CM | POA: Diagnosis present

## 2020-12-02 DIAGNOSIS — Z7901 Long term (current) use of anticoagulants: Secondary | ICD-10-CM | POA: Diagnosis not present

## 2020-12-02 DIAGNOSIS — Z800 Family history of malignant neoplasm of digestive organs: Secondary | ICD-10-CM

## 2020-12-02 DIAGNOSIS — D751 Secondary polycythemia: Secondary | ICD-10-CM | POA: Diagnosis present

## 2020-12-02 DIAGNOSIS — G319 Degenerative disease of nervous system, unspecified: Secondary | ICD-10-CM | POA: Diagnosis not present

## 2020-12-02 DIAGNOSIS — I6300 Cerebral infarction due to thrombosis of unspecified precerebral artery: Secondary | ICD-10-CM | POA: Diagnosis not present

## 2020-12-02 DIAGNOSIS — I25119 Atherosclerotic heart disease of native coronary artery with unspecified angina pectoris: Secondary | ICD-10-CM | POA: Diagnosis present

## 2020-12-02 DIAGNOSIS — I771 Stricture of artery: Secondary | ICD-10-CM | POA: Diagnosis not present

## 2020-12-02 DIAGNOSIS — R29708 NIHSS score 8: Secondary | ICD-10-CM | POA: Diagnosis not present

## 2020-12-02 DIAGNOSIS — E871 Hypo-osmolality and hyponatremia: Secondary | ICD-10-CM | POA: Diagnosis present

## 2020-12-02 DIAGNOSIS — Z90711 Acquired absence of uterus with remaining cervical stump: Secondary | ICD-10-CM

## 2020-12-02 DIAGNOSIS — Z86718 Personal history of other venous thrombosis and embolism: Secondary | ICD-10-CM

## 2020-12-02 DIAGNOSIS — I672 Cerebral atherosclerosis: Secondary | ICD-10-CM | POA: Diagnosis present

## 2020-12-02 DIAGNOSIS — I63031 Cerebral infarction due to thrombosis of right carotid artery: Secondary | ICD-10-CM | POA: Diagnosis not present

## 2020-12-02 DIAGNOSIS — Z8249 Family history of ischemic heart disease and other diseases of the circulatory system: Secondary | ICD-10-CM

## 2020-12-02 DIAGNOSIS — F32A Depression, unspecified: Secondary | ICD-10-CM | POA: Diagnosis present

## 2020-12-02 DIAGNOSIS — I6521 Occlusion and stenosis of right carotid artery: Secondary | ICD-10-CM

## 2020-12-02 DIAGNOSIS — M1990 Unspecified osteoarthritis, unspecified site: Secondary | ICD-10-CM | POA: Diagnosis present

## 2020-12-02 DIAGNOSIS — D6859 Other primary thrombophilia: Secondary | ICD-10-CM | POA: Diagnosis not present

## 2020-12-02 DIAGNOSIS — I2510 Atherosclerotic heart disease of native coronary artery without angina pectoris: Secondary | ICD-10-CM | POA: Diagnosis present

## 2020-12-02 DIAGNOSIS — Z96651 Presence of right artificial knee joint: Secondary | ICD-10-CM | POA: Diagnosis present

## 2020-12-02 DIAGNOSIS — G2581 Restless legs syndrome: Secondary | ICD-10-CM | POA: Diagnosis present

## 2020-12-02 DIAGNOSIS — G47419 Narcolepsy without cataplexy: Secondary | ICD-10-CM | POA: Diagnosis present

## 2020-12-02 DIAGNOSIS — I6389 Other cerebral infarction: Secondary | ICD-10-CM | POA: Diagnosis not present

## 2020-12-02 DIAGNOSIS — Z9889 Other specified postprocedural states: Secondary | ICD-10-CM | POA: Diagnosis not present

## 2020-12-02 DIAGNOSIS — G4489 Other headache syndrome: Secondary | ICD-10-CM | POA: Diagnosis not present

## 2020-12-02 DIAGNOSIS — R531 Weakness: Secondary | ICD-10-CM | POA: Diagnosis not present

## 2020-12-02 DIAGNOSIS — E119 Type 2 diabetes mellitus without complications: Secondary | ICD-10-CM | POA: Diagnosis not present

## 2020-12-02 DIAGNOSIS — Y92003 Bedroom of unspecified non-institutional (private) residence as the place of occurrence of the external cause: Secondary | ICD-10-CM | POA: Diagnosis not present

## 2020-12-02 DIAGNOSIS — E669 Obesity, unspecified: Secondary | ICD-10-CM | POA: Diagnosis present

## 2020-12-02 HISTORY — DX: Hyperkalemia: E87.5

## 2020-12-02 HISTORY — DX: Cerebral infarction, unspecified: I63.9

## 2020-12-02 LAB — CBC
HCT: 43.8 % (ref 36.0–46.0)
Hemoglobin: 13.7 g/dL (ref 12.0–15.0)
MCH: 27.2 pg (ref 26.0–34.0)
MCHC: 31.3 g/dL (ref 30.0–36.0)
MCV: 87.1 fL (ref 80.0–100.0)
Platelets: 275 10*3/uL (ref 150–400)
RBC: 5.03 MIL/uL (ref 3.87–5.11)
RDW: 20 % — ABNORMAL HIGH (ref 11.5–15.5)
WBC: 12.5 10*3/uL — ABNORMAL HIGH (ref 4.0–10.5)
nRBC: 0 % (ref 0.0–0.2)

## 2020-12-02 LAB — DIFFERENTIAL
Abs Immature Granulocytes: 0.1 10*3/uL — ABNORMAL HIGH (ref 0.00–0.07)
Basophils Absolute: 0.1 10*3/uL (ref 0.0–0.1)
Basophils Relative: 1 %
Eosinophils Absolute: 0.2 10*3/uL (ref 0.0–0.5)
Eosinophils Relative: 1 %
Immature Granulocytes: 1 %
Lymphocytes Relative: 18 %
Lymphs Abs: 2.2 10*3/uL (ref 0.7–4.0)
Monocytes Absolute: 0.9 10*3/uL (ref 0.1–1.0)
Monocytes Relative: 7 %
Neutro Abs: 9 10*3/uL — ABNORMAL HIGH (ref 1.7–7.7)
Neutrophils Relative %: 72 %

## 2020-12-02 LAB — COMPREHENSIVE METABOLIC PANEL
ALT: 11 U/L (ref 0–44)
AST: 21 U/L (ref 15–41)
Albumin: 2.9 g/dL — ABNORMAL LOW (ref 3.5–5.0)
Alkaline Phosphatase: 72 U/L (ref 38–126)
Anion gap: 8 (ref 5–15)
BUN: 23 mg/dL (ref 8–23)
CO2: 26 mmol/L (ref 22–32)
Calcium: 8.7 mg/dL — ABNORMAL LOW (ref 8.9–10.3)
Chloride: 97 mmol/L — ABNORMAL LOW (ref 98–111)
Creatinine, Ser: 0.83 mg/dL (ref 0.44–1.00)
GFR, Estimated: >60 mL/min (ref >60)
Glucose, Bld: 93 mg/dL (ref 70–99)
Potassium: 6.1 mmol/L — ABNORMAL HIGH (ref 3.5–5.1)
Sodium: 131 mmol/L — ABNORMAL LOW (ref 135–145)
Total Bilirubin: 0.6 mg/dL (ref 0.3–1.2)
Total Protein: 6.5 g/dL (ref 6.5–8.1)

## 2020-12-02 LAB — I-STAT CHEM 8, ED
BUN: 26 mg/dL — ABNORMAL HIGH (ref 8–23)
Calcium, Ion: 1.02 mmol/L — ABNORMAL LOW (ref 1.15–1.40)
Chloride: 99 mmol/L (ref 98–111)
Creatinine, Ser: 0.8 mg/dL (ref 0.44–1.00)
Glucose, Bld: 91 mg/dL (ref 70–99)
HCT: 44 % (ref 36.0–46.0)
Hemoglobin: 15 g/dL (ref 12.0–15.0)
Potassium: 5.8 mmol/L — ABNORMAL HIGH (ref 3.5–5.1)
Sodium: 132 mmol/L — ABNORMAL LOW (ref 135–145)
TCO2: 26 mmol/L (ref 22–32)

## 2020-12-02 LAB — GLUCOSE, CAPILLARY: Glucose-Capillary: 81 mg/dL (ref 70–99)

## 2020-12-02 LAB — RESP PANEL BY RT-PCR (FLU A&B, COVID) ARPGX2
Influenza A by PCR: NEGATIVE
Influenza B by PCR: NEGATIVE
SARS Coronavirus 2 by RT PCR: NEGATIVE

## 2020-12-02 LAB — PROTIME-INR
INR: 1 (ref 0.8–1.2)
Prothrombin Time: 13.6 seconds (ref 11.4–15.2)

## 2020-12-02 LAB — CK: Total CK: 95 U/L (ref 38–234)

## 2020-12-02 LAB — CBG MONITORING, ED: Glucose-Capillary: 103 mg/dL — ABNORMAL HIGH (ref 70–99)

## 2020-12-02 LAB — ETHANOL: Alcohol, Ethyl (B): <10 mg/dL (ref <10)

## 2020-12-02 LAB — APTT: aPTT: 32 seconds (ref 24–36)

## 2020-12-02 LAB — LACTIC ACID, PLASMA: Lactic Acid, Venous: 1.8 mmol/L (ref 0.5–1.9)

## 2020-12-02 MED ORDER — IOHEXOL 350 MG/ML SOLN
100.0000 mL | Freq: Once | INTRAVENOUS | Status: AC | PRN
  Administered 2020-12-02: 100 mL via INTRAVENOUS

## 2020-12-02 MED ORDER — STROKE: EARLY STAGES OF RECOVERY BOOK
Freq: Once | Status: AC
  Filled 2020-12-02: qty 1

## 2020-12-02 MED ORDER — ACETAMINOPHEN 325 MG PO TABS
650.0000 mg | ORAL_TABLET | ORAL | Status: DC | PRN
  Administered 2020-12-02 – 2020-12-06 (×6): 650 mg via ORAL
  Filled 2020-12-02 (×6): qty 2

## 2020-12-02 MED ORDER — ALBUTEROL SULFATE HFA 108 (90 BASE) MCG/ACT IN AERS
2.0000 | INHALATION_SPRAY | RESPIRATORY_TRACT | Status: DC | PRN
  Filled 2020-12-02: qty 6.7

## 2020-12-02 MED ORDER — ESCITALOPRAM OXALATE 10 MG PO TABS
20.0000 mg | ORAL_TABLET | Freq: Every day | ORAL | Status: DC
  Administered 2020-12-03 – 2020-12-06 (×4): 20 mg via ORAL
  Filled 2020-12-02 (×4): qty 2

## 2020-12-02 MED ORDER — ACETAMINOPHEN 160 MG/5ML PO SOLN: 650.0000 mg | ORAL | Status: DC | PRN

## 2020-12-02 MED ORDER — SENNOSIDES-DOCUSATE SODIUM 8.6-50 MG PO TABS
1.0000 | ORAL_TABLET | Freq: Every day | ORAL | Status: DC
  Administered 2020-12-02 – 2020-12-05 (×4): 1 via ORAL
  Filled 2020-12-02 (×4): qty 1

## 2020-12-02 MED ORDER — SODIUM CHLORIDE 0.9 % IV BOLUS
500.0000 mL | Freq: Once | INTRAVENOUS | Status: AC
  Administered 2020-12-02: 500 mL via INTRAVENOUS

## 2020-12-02 MED ORDER — HYDROMORPHONE HCL 2 MG PO TABS
4.0000 mg | ORAL_TABLET | Freq: Four times a day (QID) | ORAL | Status: DC | PRN
  Filled 2020-12-02: qty 2

## 2020-12-02 MED ORDER — APIXABAN 5 MG PO TABS: 5.0000 mg | ORAL_TABLET | Freq: Two times a day (BID) | ORAL | Status: DC

## 2020-12-02 MED ORDER — ASPIRIN EC 81 MG PO TBEC
81.0000 mg | DELAYED_RELEASE_TABLET | Freq: Every day | ORAL | Status: DC
  Administered 2020-12-03 – 2020-12-05 (×3): 81 mg via ORAL
  Filled 2020-12-02 (×4): qty 1

## 2020-12-02 MED ORDER — INSULIN ASPART 100 UNIT/ML ~~LOC~~ SOLN
0.0000 [IU] | Freq: Three times a day (TID) | SUBCUTANEOUS | Status: DC
  Administered 2020-12-03: 3 [IU] via SUBCUTANEOUS
  Administered 2020-12-03 – 2020-12-06 (×6): 2 [IU] via SUBCUTANEOUS
  Administered 2020-12-06: 3 [IU] via SUBCUTANEOUS

## 2020-12-02 MED ORDER — ACETAMINOPHEN 650 MG RE SUPP: 650.0000 mg | RECTAL | Status: DC | PRN

## 2020-12-02 MED ORDER — ATORVASTATIN CALCIUM 40 MG PO TABS
40.0000 mg | ORAL_TABLET | Freq: Every day | ORAL | Status: DC
  Administered 2020-12-03 – 2020-12-06 (×4): 40 mg via ORAL
  Filled 2020-12-02 (×4): qty 1

## 2020-12-02 NOTE — ED Triage Notes (Signed)
Pt brought to ED via EMS from home for left sided facial droop, left arm and leg weakness, and slurred speech. LKW 8pm yesterday, symptoms discovered at 4:30AM today. Pt alert on arrival to ED. Cleared by EDP, transferred to CT with ED RN and Stroke team.  EMS v/s: 148/51 71% on room air, 93% on 6L [place] 125 cbg  Hx COPD, CVA x 1 month ago

## 2020-12-02 NOTE — ED Notes (Signed)
Attempted to give reportx1 
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
## 2020-12-02 NOTE — Hospital Course (Signed)
Admitted 12/02/2020  Allergies: Bee venom, Cortisone, and Peanut oil Pertinent Hx: CVA, BICA stenosis, HTN, T2DM, PE, tobacco use disorder, COPD   65 y.o. female p/w left side weakness and left facial droop  *Acute CVA vs Recrudescent: stroke work up.   Consults: Neurology Meds:  VTE ppx: Lovenox IVF: Diet: NPO

## 2020-12-02 NOTE — Clinical Note (Incomplete)
[place]

## 2020-12-02 NOTE — Code Documentation (Signed)
Stroke Response Nurse Documentation Code Documentation  Barbara French is a 65 y.o. female arriving to [facility] ED via [facility] on 12/02/2020 with past medical hx of stroke and carotid stenosis. Code stroke was activated by EMS. Patient from home where she was LKW at [place] when she went to bed and now complaining of left sided facial droop and left sided weakness. On clopidogrel 75 mg daily and Eliquis (apixaban) daily. Patient had stroke two weeks ago with visual field deficits and current baseline. This morning, the patient's family noted at left facial droop when she woke up at 0430. They helped her to the bathroom and then she was put back in bed. Family noted left sided weakness that worsening over the morning and when the daughter came to pick her up, she called EMS.   Stroke team at the bedside on patient arrival. Labs drawn and patient cleared for CT by Dr. Alvino Chapel. Patient to CT with team. NIHSS 8, see documentation for details and code stroke times. Patient with disoriented, right hemianopia, left facial droop, Expressive aphasia , dysarthria  and Sensory  neglect on exam. The following imaging was completed: CT, CTA head and neck, CTP. Patient is not a candidate for tPA due to being outside the window.   Care/Plan: q2 mNIHSS/VS and admission. Bedside handoff with ED RN April.    Kathrin Greathouse  Stroke Response RN

## 2020-12-02 NOTE — ED Provider Notes (Signed)
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 Imaged in expiration. Right upper lobe nodule is not well evaluated. Review of the MIP images confirms the above findings CTA  HEAD FINDINGS Anterior circulation: Intracranial internal carotid arteries are patent with calcified plaque causing mild stenosis. Anterior and middle cerebral arteries are patent. Posterior circulation: Intracranial vertebral arteries are patent with calcified plaque causing mild stenosis on the left. Basilar artery is patent. Major cerebellar artery origins are patent. Stable severe stenosis of the right P2 PCA. Left posterior cerebral artery is patent. Venous sinuses: As permitted by contrast timing, patent. Review of the MIP images confirms the above findings CT Brain Perfusion Findings: CBF (<30%) Volume: 66mL Perfusion (Tmax>6.0s) volume: 59mL Mismatch Volume:  85mL Infarction Location:Number is located within the area of right PCA infarction with additional involvement of right MCA watershed territory. IMPRESSION: No significant change since CTA 11/21/2020. No new large vessel occlusion. Remains critical stenosis of the proximal right ICA with near occlusion. Perfusion imaging demonstrates no evidence of core infarction. There is calculated penumbra involving the area of subacute right PCA territory infarction as well as the right MCA watershed territory likely related to above. Electronically Signed   By: Macy Mis M.D.   On: 12/02/2020 14:21    Procedures Procedures   Medications Ordered in ED Medications  albuterol (VENTOLIN HFA) 108 (90 Base) MCG/ACT inhaler 2 puff (has no administration in time range)  sodium chloride 0.9 % bolus 500 mL (0 mLs Intravenous Stopped 12/02/20 1515)  iohexol (OMNIPAQUE) 350 MG/ML injection 100 mL (100 mLs Intravenous Contrast Given 12/02/20 1405)  sodium chloride 0.9 % bolus 500 mL (500 mLs Intravenous New Bag/Given 12/02/20 1427)    ED Course  I have reviewed the triage vital signs and the nursing notes.  Pertinent labs & imaging results that were available during my care of the patient were reviewed by me and considered in my medical decision making (see chart for details).    MDM Rules/Calculators/A&P                          Patient came in as a code stroke.  However not a tPA candidate due to time of onset, anticoagulation and recent stroke.  Has severe vascular disease.  Also had some hypoxia.  Improved being on oxygen.  Has oxygen at home but has not really been using it.  Mild hypotension which also could contribute.  Do not think she is septic at this time.  Will admit to internal medicine.  CRITICAL CARE Performed by: Davonna Belling Total critical care time: 30 minutes Critical care time was exclusive of separately billable procedures and treating other patients. Critical care was necessary to  treat or prevent imminent or life-threatening deterioration. Critical care was time spent personally by me on the following activities: development of treatment plan with patient and/or surrogate as well as nursing, discussions with consultants, evaluation of patient's response to treatment, examination of patient, obtaining history from patient or surrogate, ordering and performing treatments and interventions, ordering and review of laboratory studies, ordering and review of radiographic studies, pulse oximetry and re-evaluation of patient's condition.  Final Clinical Impression(s) / ED Diagnoses Final diagnoses:  Cerebrovascular accident (CVA), unspecified mechanism ([place])  Hypoxia  Hyperkalemia    Rx / DC Orders ED Discharge Orders    None       Davonna Belling, MD 12/02/20 1601
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
## 2020-12-03 DIAGNOSIS — R911 Solitary pulmonary nodule: Secondary | ICD-10-CM

## 2020-12-03 DIAGNOSIS — Z86711 Personal history of pulmonary embolism: Secondary | ICD-10-CM

## 2020-12-03 DIAGNOSIS — J449 Chronic obstructive pulmonary disease, unspecified: Secondary | ICD-10-CM

## 2020-12-03 DIAGNOSIS — E119 Type 2 diabetes mellitus without complications: Secondary | ICD-10-CM

## 2020-12-03 DIAGNOSIS — I63031 Cerebral infarction due to thrombosis of right carotid artery: Secondary | ICD-10-CM

## 2020-12-03 DIAGNOSIS — I6521 Occlusion and stenosis of right carotid artery: Secondary | ICD-10-CM

## 2020-12-03 DIAGNOSIS — Z720 Tobacco use: Secondary | ICD-10-CM

## 2020-12-03 LAB — URINALYSIS, ROUTINE W REFLEX MICROSCOPIC
Bilirubin Urine: NEGATIVE
Glucose, UA: NEGATIVE mg/dL
Hgb urine dipstick: NEGATIVE
Ketones, ur: NEGATIVE mg/dL
Leukocytes,Ua: NEGATIVE
Nitrite: NEGATIVE
Protein, ur: NEGATIVE mg/dL
Specific Gravity, Urine: 1.017 (ref 1.005–1.030)
pH: 5 (ref 5.0–8.0)

## 2020-12-03 LAB — RAPID URINE DRUG SCREEN, HOSP PERFORMED
Amphetamines: NOT DETECTED
Barbiturates: NOT DETECTED
Benzodiazepines: NOT DETECTED
Cocaine: NOT DETECTED
Opiates: POSITIVE — AB
Tetrahydrocannabinol: NOT DETECTED

## 2020-12-03 LAB — BASIC METABOLIC PANEL
Anion gap: 7 (ref 5–15)
BUN: 13 mg/dL (ref 8–23)
CO2: 24 mmol/L (ref 22–32)
Calcium: 8.2 mg/dL — ABNORMAL LOW (ref 8.9–10.3)
Chloride: 103 mmol/L (ref 98–111)
Creatinine, Ser: 0.5 mg/dL (ref 0.44–1.00)
GFR, Estimated: >60 mL/min (ref >60)
Glucose, Bld: 92 mg/dL (ref 70–99)
Potassium: 4.6 mmol/L (ref 3.5–5.1)
Sodium: 134 mmol/L — ABNORMAL LOW (ref 135–145)

## 2020-12-03 LAB — GLUCOSE, CAPILLARY
Glucose-Capillary: 105 mg/dL — ABNORMAL HIGH (ref 70–99)
Glucose-Capillary: 136 mg/dL — ABNORMAL HIGH (ref 70–99)
Glucose-Capillary: 162 mg/dL — ABNORMAL HIGH (ref 70–99)
Glucose-Capillary: 128 mg/dL — ABNORMAL HIGH (ref 70–99)

## 2020-12-03 LAB — CBC
HCT: 39.8 % (ref 36.0–46.0)
Hemoglobin: 12.5 g/dL (ref 12.0–15.0)
MCH: 27.2 pg (ref 26.0–34.0)
MCHC: 31.4 g/dL (ref 30.0–36.0)
MCV: 86.7 fL (ref 80.0–100.0)
Platelets: 256 10*3/uL (ref 150–400)
RBC: 4.59 MIL/uL (ref 3.87–5.11)
RDW: 20 % — ABNORMAL HIGH (ref 11.5–15.5)
WBC: 9 10*3/uL (ref 4.0–10.5)
nRBC: 0 % (ref 0.0–0.2)

## 2020-12-03 LAB — TSH: TSH: 0.894 u[IU]/mL (ref 0.350–4.500)

## 2020-12-03 LAB — HIV ANTIBODY (ROUTINE TESTING W REFLEX): HIV Screen 4th Generation wRfx: NONREACTIVE

## 2020-12-03 MED ORDER — CYCLOBENZAPRINE HCL 10 MG PO TABS
10.0000 mg | ORAL_TABLET | Freq: Once | ORAL | Status: AC
  Administered 2020-12-03: 10 mg via ORAL
  Filled 2020-12-03: qty 1

## 2020-12-03 MED ORDER — TICAGRELOR 90 MG PO TABS
180.0000 mg | ORAL_TABLET | Freq: Once | ORAL | Status: AC
  Administered 2020-12-03: 180 mg via ORAL
  Filled 2020-12-03: qty 2

## 2020-12-03 MED ORDER — SODIUM CHLORIDE 0.9 % IV SOLN: INTRAVENOUS | Status: AC

## 2020-12-03 MED ORDER — TICAGRELOR 90 MG PO TABS
90.0000 mg | ORAL_TABLET | Freq: Two times a day (BID) | ORAL | Status: DC
  Administered 2020-12-04 – 2020-12-06 (×5): 90 mg via ORAL
  Filled 2020-12-03 (×5): qty 1

## 2020-12-03 MED ORDER — LACTATED RINGERS IV BOLUS
1000.0000 mL | Freq: Once | INTRAVENOUS | Status: AC
  Administered 2020-12-03: 1000 mL via INTRAVENOUS

## 2020-12-03 NOTE — Plan of Care (Signed)
  Problem: Education: Goal: Knowledge of disease or condition will improve Outcome: Progressing Goal: Knowledge of secondary prevention will improve Outcome: Progressing Goal: Knowledge of patient specific risk factors addressed and post discharge goals established will improve Outcome: Progressing Goal: Individualized Educational Video(s) Outcome: Progressing   Problem: Coping: Goal: Will verbalize positive feelings about self Outcome: Progressing Goal: Will identify appropriate support needs Outcome: Progressing   Problem: Health Behavior/Discharge Planning: Goal: Ability to manage health-related needs will improve Outcome: Progressing   Problem: Self-Care: Goal: Ability to communicate needs accurately will improve Outcome: Progressing   Problem: Nutrition: Goal: Risk of aspiration will decrease Outcome: Progressing   Problem: Intracerebral Hemorrhage Tissue Perfusion: Goal: Complications of Intracerebral Hemorrhage will be minimized Outcome: Progressing   Problem: Ischemic Stroke/TIA Tissue Perfusion: Goal: Complications of ischemic stroke/TIA will be minimized Outcome: Progressing   Problem: Education: Goal: Knowledge of General Education information will improve Description: Including pain rating scale, medication(s)/side effects and non-pharmacologic comfort measures Outcome: Progressing   Problem: Health Behavior/Discharge Planning: Goal: Ability to manage health-related needs will improve Outcome: Progressing   Problem: Clinical Measurements: Goal: Ability to maintain clinical measurements within normal limits will improve Outcome: Progressing Goal: Will remain free from infection Outcome: Progressing Goal: Diagnostic test results will improve Outcome: Progressing Goal: Respiratory complications will improve Outcome: Progressing Goal: Cardiovascular complication will be avoided Outcome: Progressing   Problem: Activity: Goal: Risk for activity  intolerance will decrease Outcome: Progressing   Problem: Nutrition: Goal: Adequate nutrition will be maintained Outcome: Progressing   Problem: Coping: Goal: Level of anxiety will decrease Outcome: Progressing   Problem: Elimination: Goal: Will not experience complications related to bowel motility Outcome: Progressing Goal: Will not experience complications related to urinary retention Outcome: Progressing   Problem: Pain Managment: Goal: General experience of comfort will improve Outcome: Progressing   Problem: Safety: Goal: Ability to remain free from injury will improve Outcome: Progressing   Problem: Skin Integrity: Goal: Risk for impaired skin integrity will decrease Outcome: Progressing

## 2020-12-03 NOTE — Evaluation (Signed)
Physical Therapy Evaluation Patient Details Name: Barbara French MRN: [id number] DOB: 1956-08-07 Today's Date: 12/03/2020   History of Present Illness  Pt is 65 yo female who presents after falling down 4 steps and being unable to get up, L facial droop, slurred speech. MRI showed extension of R PCA infarct that she had in early 4/22. PMH: R PCA infarct 11/20/20, B carotid stenosis s/p R carotid endarterectomy 12/21 and L stent placement, HTN, DM2, smoker, PE, COPD, R TKA, multiple neck and back surgeries, and narcolepsy. Pt scheduled to have another stent placement 4/27.  Clinical Impression  Pt admitted with above diagnosis. Pt presents with limited insight into limitations including decreased awareness of L visual field. She reports that she has had some visual deficits since last CVA but is unable to say whether it is worse or not and she is not currently compensating adequately. Min A to ambulate with bumping obstacles on L side. She reports after last stroke she received only one HHPT visit, recommending outpt this time for further progression. She has now fallen down the stairs multiple times at home. Will continue to follow after procedure tomorrow.  Pt currently with functional limitations due to the deficits listed below (see PT Problem List). Pt will benefit from skilled PT to increase their independence and safety with mobility to allow discharge to the venue listed below.       Follow Up Recommendations Outpatient PT;Supervision/Assistance - 24 hour    Equipment Recommendations  None recommended by PT    Recommendations for Other Services       Precautions / Restrictions Precautions Precautions: Fall Precaution Comments: pt has had multiple falls down stairs at home including one PTA yesterday Restrictions Weight Bearing Restrictions: No      Mobility  Bed Mobility Overal bed mobility: Needs Assistance Bed Mobility: Supine to Sit;Sit to Supine     Supine to sit:  Supervision Sit to supine: Supervision   General bed mobility comments: pt able to manage covers independently and come to long sitting then sitting EOB without physical assist    Transfers Overall transfer level: Needs assistance Equipment used: Rolling walker (2 wheeled);None Transfers: Sit to/from Stand Sit to Stand: Min guard         General transfer comment: min-guard from bed and toilet  Ambulation/Gait Ambulation/Gait assistance: Min assist Gait Distance (Feet): 200 Feet Assistive device: Rolling walker (2 wheeled);None Gait Pattern/deviations: Step-through pattern;Decreased stride length;Staggering left Gait velocity: WFL Gait velocity interpretation: >2.62 ft/sec, indicative of community ambulatory General Gait Details: pt lacking path finding abillities in hallway. Running into L sided objects and unaware even after hitting them. Needed min A for safety  Stairs            Wheelchair Mobility    Modified Rankin (Stroke Patients Only) Modified Rankin (Stroke Patients Only) Pre-Morbid Rankin Score: Moderate disability Modified Rankin: Moderately severe disability     Balance Overall balance assessment: Needs assistance;History of Falls Sitting-balance support: No upper extremity supported;Feet supported Sitting balance-Leahy Scale: Good     Standing balance support: No upper extremity supported Standing balance-Leahy Scale: Fair Standing balance comment: pt able to maintain static standing but unable to compensate for perturbation to L and with decreased awareness of deficits                             Pertinent Vitals/Pain Pain Assessment: Faces Faces Pain Scale: Hurts even more Pain Location: neck Pain Descriptors /  Indicators: Constant;Aching;Sore Pain Intervention(s): Monitored during session    Home Living Family/patient expects to be discharged to:: Private residence Living Arrangements: Children Available Help at Discharge:  Family;Available PRN/intermittently Type of Home: House Home Access: Stairs to enter     Home Layout: Two level;1/2 bath on main level Home Equipment: Walker - 2 wheels;Cane - single point Additional Comments: pt reports that her daughter and 2 and 90 yo grandchildren live with her. Pt has not driven since CVA earlier this month    Prior Function Level of Independence: Independent with assistive device(s)         Comments: has used a cane since CVA earlier in April     Hand Dominance   Dominant Hand: Right    Extremity/Trunk Assessment   Upper Extremity Assessment Upper Extremity Assessment: Defer to OT evaluation    Lower Extremity Assessment Lower Extremity Assessment: Generalized weakness    Cervical / Trunk Assessment Cervical / Trunk Assessment: Kyphotic;Other exceptions Cervical / Trunk Exceptions: h/o multiple neck and back surgeries but reports neck has been more painful since falling  Communication   Communication: No difficulties  Cognition Arousal/Alertness: Awake/alert Behavior During Therapy: WFL for tasks assessed/performed Overall Cognitive Status: Impaired/Different from baseline Area of Impairment: Safety/judgement;Problem solving;Awareness;Memory                     Memory: Decreased short-term memory   Safety/Judgement: Decreased awareness of deficits Awareness: Emergent Problem Solving: Difficulty sequencing;Requires verbal cues General Comments: pt bumping objects on L side and unaware until PT gives vc's for redirection. After first time, she did it again several other times. Poor historian.      General Comments General comments (skin integrity, edema, etc.): SPO2 86% on RA, 92% on 2L. Hr in 90's    Exercises     Assessment/Plan    PT Assessment Patient needs continued PT services  PT Problem List Decreased strength;Decreased balance;Decreased mobility;Decreased coordination;Decreased cognition;Decreased knowledge of use of  DME;Decreased safety awareness;Decreased knowledge of precautions;Pain;Cardiopulmonary status limiting activity       PT Treatment Interventions DME instruction;Gait training;Stair training;Functional mobility training;Therapeutic activities;Therapeutic exercise;Balance training;Neuromuscular re-education;Cognitive remediation;Patient/family education    PT Goals (Current goals can be found in the Care Plan section)  Acute Rehab PT Goals Patient Stated Goal: get better PT Goal Formulation: With patient Time For Goal Achievement: 12/17/20 Potential to Achieve Goals: Good    Frequency Min 4X/week   Barriers to discharge Inaccessible home environment bedroom upstairs    Co-evaluation               AM-PAC PT "6 Clicks" Mobility  Outcome Measure Help needed turning from your back to your side while in a flat bed without using bedrails?: None Help needed moving from lying on your back to sitting on the side of a flat bed without using bedrails?: None Help needed moving to and from a bed to a chair (including a wheelchair)?: A Little Help needed standing up from a chair using your arms (e.g., wheelchair or bedside chair)?: A Little Help needed to walk in hospital room?: A Little Help needed climbing 3-5 steps with a railing? : A Lot 6 Click Score: 19    End of Session Equipment Utilized During Treatment: Gait belt Activity Tolerance: Patient tolerated treatment well Patient left: in bed;with call bell/phone within reach;with bed alarm set Nurse Communication: Mobility status PT Visit Diagnosis: Unsteadiness on feet (R26.81);Repeated falls (R29.6);Pain;Difficulty in walking, not elsewhere classified (R26.2) Pain - part of body:  (  neck)    Time: 6808-8110 PT Time Calculation (min) (ACUTE ONLY): 31 min   Charges:   PT Evaluation $PT Eval Moderate Complexity: 1 Mod PT Treatments $Gait Training: 8-22 mins        Leighton Roach, [place]  Pager  [phone] Office [place] 12/03/2020, 4:20 PM

## 2020-12-03 NOTE — Progress Notes (Signed)
OT Cancellation Note  Patient Details Name: Barbara French MRN: [id number] DOB: 01-11-1956   Cancelled Treatment:    Reason Eval/Treat Not Completed: Active bedrest order- pt on bedrest, will follow and see as able once activity orders are updated.    Jolaine Artist, OT Acute Rehabilitation Services Pager [phone] Office [phone]   Delight Stare 12/03/2020, 8:18 AM

## 2020-12-03 NOTE — Progress Notes (Addendum)
HD#1 Subjective:  Overnight Events: Patient received 1 L bolus of LR last night due to hypotension of 99/43.  Blood pressure improved to 130/58 with MAP 79.  Patient is seen at bedside with her sister and her son.  Patient is sleepy during examination but arousable to verbal stimulation.  She denies any complaints.  Denies any new focal weakness, no change in her vision.  Family have talked to interventional radiologist and agreed to do a right internal carotid artery stent.  States that they have tried to encourage smoking cessation but unsuccessful.  Family agreed that patient will need higher level of care going forward.  Objective:  Vital signs in last 24 hours: Vitals:   12/03/20 0123 12/03/20 0203 12/03/20 0212 12/03/20 0318  BP: (!) 112/56 (!) 102/51 (!) 99/43 (!) 132/58  Pulse: 94 90 91 92  Resp:  20  17  Temp:  97.7 F (36.5 C)  97.7 F (36.5 C)  TempSrc:  Oral  Oral  SpO2: 95% 97%  94%  Weight:      Height:       Supplemental O2: Room Air SpO2: 94 % O2 Flow Rate (L/min): 3 L/min   Physical Exam:  Physical Exam Constitutional:      General: She is not in acute distress.    Appearance: She is not toxic-appearing.     Comments: Somnolent but arousable to verbal stimulation  Eyes:     General: No scleral icterus.       Right eye: No discharge.        Left eye: No discharge.     Conjunctiva/sclera: Conjunctivae normal.  Cardiovascular:     Rate and Rhythm: Normal rate and regular rhythm.     Heart sounds: Normal heart sounds.  Pulmonary:     Effort: Pulmonary effort is normal. No respiratory distress.     Breath sounds: Normal breath sounds. No wheezing.  Neurological:     Mental Status: She is alert.     Filed Weights   12/02/20 1300  Weight: 85 kg     Intake/Output Summary (Last 24 hours) at 12/03/2020 3086 Last data filed at 12/03/2020 0600 Gross per 24 hour  Intake 1440 ml  Output 1950 ml  Net -510 ml   Net IO Since Admission: -510 mL  [12/03/20 0733]   Assessment/Plan:   Principal Problem:   CVA (cerebral vascular accident) [facility]) Active Problems:   Essential hypertension   Diabetes mellitus due to underlying condition with unspecified complications ([place])   Bilateral carotid artery stenosis   Hyponatremia   PAD (peripheral artery disease) ([place])   Hyperkalemia   Patient Summary: Barbara French is a 65 year old female with past medical history of prior right PCA infarct (4/13), bilateral carotid artery stenosis status post right carotid endarterectomy (07/2020) and left stent placement, hypertension, T2DM, PE on Eliquis, COPD, narcolepsy who presented to the ED after sudden onset of left sided weakness, left facial droop and slurred speech, found to have acute CVA of the right parietal and right frontoparietal area.   Acute/subacute CVA  History of prior right PCA infarct (11/2020) Right internal carotid stenosis s/p CEA 07/2020 The underlying cause is severe right carotid artery stenosis plus a component of hypotension 2/2 dehydration.  IR has spoken to family and patient's vascular surgeon Dr. Maryjean Morn, they all agree with doing a stent placement of the right ICA at this time rather than a revascularization surgery outpatient.  Planned surgery on 12/05/2020 - Permissive hypertension < 220/110  in the next 24/48 hours.  Holding home blood pressure meds.  Avoid hypotension. - Continue atorvastatin 40 mg - Start Brilinta and continue for 30 days after stent placement. - Holding Eliquis until after the procedure  - Pending formal SLP/PT/OT  - Also encourage patient on smoking cessation   History of PE after a surgery 3 years ago - Will resume Eliquis after R ICA stent placement  - SCD    Type 2 diabetes Home regimen include metformin and Januvia.   A1c of 6.7 on 4/13. Goal A1C < 6.4 - Continue SSI - Monitor CBG   COPD Tobacco use disorder - albuterol PRN - Encourage cessation    Lung nodule 10 mm lung nodule found  on last CTA. - Follow up outpatient imaging    Diet: heart healthy IVF: NA CODE: Full DVT: brillinta, SCD Dispo: pending PT/OT recs  Gaylan Gerold, DO 12/03/2020, 7:33 AM Pager: [phone]  Please contact the on call pager after 5 pm and on weekends at [phone].

## 2020-12-03 NOTE — Progress Notes (Signed)
PT Cancellation Note  Patient Details Name: Barbara French MRN: [id number] DOB: January 27, 1956   Cancelled Treatment:    Reason Eval/Treat Not Completed: Active bedrest order Will follow for updated activity orders.   Leighton Roach, [place]  Pager [phone] Office [place] 12/03/2020, 8:37 AM

## 2020-12-03 NOTE — Consult Note (Addendum)
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 Stable appearance of degenerative and postoperative changes of the cervical spine. Degenerative changes of the left temporomandibular joint. Other neck: No new findings. Upper chest: Imaged in expiration. Right upper lobe nodule is not well evaluated. Review of the MIP images confirms the above findings CTA HEAD FINDINGS Anterior circulation: Intracranial internal carotid arteries are patent with calcified plaque causing mild stenosis. Anterior and middle cerebral arteries are patent. Posterior circulation: Intracranial vertebral arteries are patent with calcified plaque causing mild stenosis on the left. Basilar artery is patent. Major cerebellar artery origins are patent. Stable severe stenosis of the right P2 PCA. Left posterior cerebral artery is patent. Venous sinuses: As permitted by contrast timing, patent. Review of the MIP images confirms the above findings CT Brain Perfusion Findings: CBF (<30%) Volume: 33mL  Perfusion (Tmax>6.0s) volume: 48mL Mismatch Volume: 77mL Infarction Location:Number is located within the area of right PCA infarction with additional involvement of right MCA watershed territory. IMPRESSION: No significant change since CTA 11/21/2020. No new large vessel occlusion. Remains critical stenosis of the proximal right ICA with near occlusion. Perfusion imaging demonstrates no evidence of core infarction. There is calculated penumbra involving the area of subacute right PCA territory infarction as well as the right MCA watershed territory likely related to above. Electronically Signed   By: Macy Mis M.D.   On:  12/02/2020 14:21   CT ANGIO NECK CODE STROKE  Result Date: 12/02/2020 CLINICAL DATA:  Left-sided weakness EXAM: CT ANGIOGRAPHY HEAD AND NECK CT PERFUSION BRAIN TECHNIQUE: Multidetector CT imaging of the head and neck was performed using the standard protocol during bolus administration of intravenous contrast. Multiplanar CT image reconstructions and MIPs were obtained to evaluate the vascular anatomy. Carotid stenosis measurements (when applicable) are obtained utilizing NASCET criteria, using the distal internal carotid diameter as the denominator. Multiphase CT imaging of the brain was performed following IV bolus contrast injection. Subsequent parametric perfusion maps were calculated using RAPID software. CONTRAST:  147mL OMNIPAQUE IOHEXOL 350 MG/ML SOLN COMPARISON:  CTA 11/21/2020 FINDINGS: CTA NECK FINDINGS Aortic arch: Stable appearance with primarily calcified plaque along the arch and great vessel origins. Right carotid system: Common carotid is patent. As before, soft tissue thickening is noted about the bifurcation, which may reflect prior endarterectomy. Remains similar critical stenosis of the proximal ICA with near occlusion. Left carotid system: Patent. As before, there is a patent stent spanning distal common and proximal internal carotid arteries. Vertebral arteries:  Patent. Plaque at the left vertebral origin. No new stenosis. Skeleton: Stable appearance of degenerative and postoperative changes of the cervical spine. Degenerative changes of the left temporomandibular joint. Other neck: No new findings. Upper chest: Imaged in expiration. Right upper lobe nodule is not well evaluated. Review of the MIP images confirms the above findings CTA HEAD FINDINGS Anterior circulation: Intracranial internal carotid arteries are patent with calcified plaque causing mild stenosis. Anterior and middle cerebral arteries are patent. Posterior circulation: Intracranial vertebral arteries are patent with calcified plaque causing mild stenosis on the left. Basilar artery is patent. Major cerebellar artery origins are patent. Stable severe stenosis of the right P2 PCA. Left posterior cerebral artery is patent. Venous sinuses: As permitted by contrast timing, patent. Review of the MIP images confirms the above findings CT Brain Perfusion Findings: CBF (<30%) Volume: 42mL Perfusion (Tmax>6.0s) volume: 56mL Mismatch Volume: 21mL Infarction Location:Number is located within the area of right PCA infarction with additional involvement of right MCA watershed territory. IMPRESSION: No significant change since CTA 11/21/2020. No new large vessel occlusion. Remains critical stenosis of the proximal right ICA with near occlusion. Perfusion imaging demonstrates no evidence of core infarction. There is calculated penumbra involving the area of subacute right PCA territory infarction as well as the right MCA watershed territory likely related to above. Electronically Signed   By: Macy Mis M.D.   On: 12/02/2020 14:21    Labs:  CBC: Recent Labs    12/02/20 1330 12/02/20 1338 12/03/20 0257  WBC 12.5*  --  9.0  HGB 13.7 15.0 12.5  HCT 43.8 44.0 39.8  PLT 275  --  256    COAGS: Recent Labs    12/02/20 1330  INR 1.0  APTT 32    BMP: Recent Labs    12/02/20 1330 12/02/20 1338  12/03/20 0257  NA 131* 132* 134*  K 6.1* 5.8* 4.6  CL 97* 99 103  CO2 26  --  24  GLUCOSE 93 91 92  BUN 23 26* 13  CALCIUM 8.7*  --  8.2*  CREATININE 0.83 0.80 0.50  GFRNONAA >60  --  >60    LIVER FUNCTION TESTS: Recent Labs    12/02/20 1330  BILITOT 0.6  AST 21  ALT 11  ALKPHOS 72  PROT 6.5  ALBUMIN 2.9*     Assessment and Plan:  Acute/early subacute CVA (right parietal lobe infarcts,  right frontoparietal infarcts, right MCA watershed infarcts) in setting of proximal right ICA critical stenosis (near occlusion). Plan for image-guided cerebral arteriogram with possible revascularization (angioplasty/stent placement) of right ICA stenosis in IR tentatively for Thursday 12/05/2020 with Dr. Earleen Newport and anesthesia pending IR scheduling. Dr. Earleen Newport at bedside, discussed plans for upcoming procedure (including risks, benefits, and alternatives) with patient and family. All questions answered and concerns addressed.  Patient will be NPO at midnight prior to procedure. Afebrile and WBCs WNL. Continue to hold Eliquis. Will load with Brilinta 180 mg x1 today, begin Brilinta 90 mg twice daily tomorrow. Continue Aspirin 81 mg once daily. INR 1.0 yesterday. COVID negative yesterday.  Tobacco cessation discussed with patient. Meriel Pica, RN updated of plans via telephone. Further plans per IMTS/neurology- appreciate and agree with management.  Risks and benefits of cerebral arteriogram with intervention were discussed with the patient including, but not limited to bleeding, infection, vascular injury, contrast induced renal failure, stroke, reperfusion hemorrhage, or even death. This interventional procedure involves the use of X-rays and because of the nature of the planned procedure, it is possible that we will have prolonged use of X-ray fluoroscopy. Potential radiation risks to you include (but are not limited to) the following: - A slightly elevated risk for cancer  several years later  in life. This risk is typically less than 0.5% percent. This risk is low in comparison to the normal incidence of human cancer, which is 33% for women and 50% for men according to the [employer]. - Radiation induced injury can include skin redness, resembling a rash, tissue breakdown / ulcers and hair loss (which can be temporary or permanent).  The likelihood of either of these occurring depends on the difficulty of the procedure and whether you are sensitive to radiation due to previous procedures, disease, or genetic conditions.  IF your procedure requires a prolonged use of radiation, you will be notified and given written instructions for further action.  It is your responsibility to monitor the irradiated area for the 2 weeks following the procedure and to notify your physician if you are concerned that you have suffered a radiation induced injury.   All of the patient's questions were answered, patient is agreeable to proceed. Consent signed and in chart.   Thank you for this interesting consult.  I greatly enjoyed meeting Oluwatobi L Chatterjee and look forward to participating in their care.  A copy of this report was sent to the requesting provider on this date.  Electronically Signed: Earley Abide, PA-C 12/03/2020, 10:24 AM   I spent a total of 40 Minutes in face to face in clinical consultation, greater than 50% of which was counseling/coordinating care for right ICA stenosis/revascularization.

## 2020-12-03 NOTE — Progress Notes (Signed)
STROKE TEAM PROGRESS NOTE  Brief HPI: Ms Barbara French is a 65 yo female with a PMHx of Right PCA stroke 2 weeks ago at [facility], morbid obesity, PAD, HTN, HLD, tobacco abuse, nocturnal hypoxemia, depression, neuropathy, CAD with remote MI, GERD, DM II, RLS, dysphagia, PE, COPD, anemia, BICA stenosis with recent stenting of left and planned stenting of right. Per family report her only residual deficit after the R PCA stroke 2 weeks ago was L homonymous hemianopsia.   INTERVAL HISTORY No acute events Today patient is mildly somnolent, history is mostly obtained from family visiting in room.  They report patient fell out of bed around 0400 and then was noted to be left sided facial droop and weakness. Symptoms seem to have resolved in the ED. She is currently smoking a pack a day.   We discussed her stroke diagnosis and plan of care including recent plans for vascular surgeon to address right internal carotid stenosis. She is agreeable to proceeding with IR intervention for the right carotid stenosis here with Dr. Earleen Newport per her discussion with him. She was counseled regarding improving control of her stroke risk factors. She was agreeable to considering smoking cessation.  Questions were answered.   Vitals:   12/03/20 0212 12/03/20 0318 12/03/20 0700 12/03/20 1214  BP: (!) 99/43 (!) 132/58 (!) 180/79 (!) 145/103  Pulse: 91 92 91 (!) 58  Resp:  17 19   Temp:  97.7 F (36.5 C) 98.1 F (36.7 C) 97.7 F (36.5 C)  TempSrc:  Oral Oral Oral  SpO2:  94% 95% 99%  Weight:      Height:       CBC:  Recent Labs  Lab 12/02/20 1330 12/02/20 1338 12/03/20 0257  WBC 12.5*  --  9.0  NEUTROABS 9.0*  --   --   HGB 13.7 15.0 12.5  HCT 43.8 44.0 39.8  MCV 87.1  --  86.7  PLT 275  --  195   Basic Metabolic Panel:  Recent Labs  Lab 12/02/20 1330 12/02/20 1338 12/03/20 0257  NA 131* 132* 134*  K 6.1* 5.8* 4.6  CL 97* 99 103  CO2 26  --  24  GLUCOSE 93 91 92  BUN 23 26* 13   CREATININE 0.83 0.80 0.50  CALCIUM 8.7*  --  8.2*   Lipid Panel: No results for input(s): CHOL, TRIG, HDL, CHOLHDL, VLDL, LDLCALC in the last 168 hours. HgbA1c: No results for input(s): HGBA1C in the last 168 hours. Urine Drug Screen:  Recent Labs  Lab 12/03/20 0002  LABOPIA POSITIVE*  COCAINSCRNUR NONE DETECTED  LABBENZ NONE DETECTED  AMPHETMU NONE DETECTED  THCU NONE DETECTED  LABBARB NONE DETECTED    Alcohol Level  Recent Labs  Lab 12/02/20 1330  ETH <10   IMAGING  NCT head  There is no acute intracranial hemorrhage or evidence of acute infarction. ASPECT score is 10. No significant change since recent prior study. Small infarcts on the prior MRI are not well seen. Subacute and chronic infarcts as seen previously.  CTA head and neck and perfusion No significant change since CTA 11/21/2020. No new large vessel occlusion. Remains critical stenosis of the proximal right ICA with near occlusion. Perfusion imaging demonstrates no evidence of core infarction. There is calculated penumbra involving the area of subacute right PCA territory infarction as well as the right MCA watershed territory likely related to above.  MRI Acute/early subacute cortical infarction changes within the right parietal lobe (PCA vascular territory), slightly  progressed from the brain MRI of 11/21/2020.  PHYSICAL EXAM Psych: Affect appropriate to situation Eyes: No scleral injection HENT: No OP obstrucion Head: Normocephalic.  Cardiovascular: Normal rate and regular rhythm.  Respiratory: Effort normal . GI: Soft.  No distension. There is no tenderness.  Skin: WDI  Neuro: Mental Status: Patient is awake, alert,. She can state age but not month. Patient is unable to give a clear and coherent history. Speech/Language:  Mild dysarthria and no aphasia. Fluency, naming, repetition, and comprehensio intact. Cranial Nerves: II: Pupils are equal, round, and reactive to light.   Dense  LHH III,IV, VI: EOMI without ptosis or diploplia. V: Facial sensation is symmetric to light touch.  VII: Slight left facial droop. X: Uvula elevates symmetrically XI: Shoulder shrug is symmetric. XII: tongue is midline without atrophy or fasciculations.  Motor: Tone is normal. Bulk is increased. 5/5 strength was present in all four extremities, although patient had difficulty following commands on L side 2/2 neglect. Sensory: Sensation is symmetric to light touch in the arms and legs. Noted extinction to left with DSS to light touch.   Plantars: Toes are downgoing bilaterally.  Cerebellar: FNF and HKS are intact bilaterally NIHSS 4.  Premorbid modified Rankin 1 ASSESSMENT/PLAN Barbara French is a 65 year old female with past medical history of prior right PCA infarct (4/13), bilateral carotid artery stenosis status post right carotid endarterectomy (07/2020) and left stent placement, hypertension, T2DM, PE on Eliquis, COPD, left homonymous hemianopsia, narcolepsy who presented to the ED after sudden onset of left sided weakness, left facial droop and slurred speech. MRI shows right parietal lobe acute and subacute cortical infarctions along with a few subcentimeter acute/early subacute infarcts within the right frontoparietal white matter. CTA h/n re-demonstrates known right critical ICA stenosis.   Acute and early subacute Right PCA stroke in setting of proximal right ICA critical stenosis    2D Echo from recent admission at [facility] shows EF 60-65%, no shunt, no clot ,   Passed swallow eval for heart healthy diet   On ASA 81mg  and Eliquis prior to admission  Loaded with Brilinta, On ASA 81mg  daily and will begin Brilinta 90 mg BID tomorrow  Eliquis on hold per Dr. Earleen Newport in preprocedure setting  Therapy recommendations:  None   Disposition:  TBD  Right ICA stenosis  Plan for image-guided cerebral arteriogram with possible revascularization (angioplasty/stent placement) of  right ICA stenosis in IR tentatively for Thursday 12/05/2020 with Dr. Earleen Newport and anesthesia   Hx of DVT  Hold Eliquis pre-procedure  Hypertension . Permissive hypertension (OK if < 220/120) but gradually normalize in 5-7 days . Long-term BP goal normotensive  Hyperlipidemia  Home meds: High intensity Lipitor 40mg  on board   LDL from 4/13 showing at 40 which is at goal < 70  Continue statin at discharge  Diabetes type II Controlled   HgbA1c 6.7 at goal 7, on 4/13  CBGs Recent Labs    12/02/20 2044 12/03/20 0622 12/03/20 1156  GLUCAP 81 105* 136*      SSI  Management per primary team  Other Stroke Risk Factors  Current Cigarette smoker: 24 pack year history, counseled to quit.   Current ETOH use, alcohol level <10, advised to drink no more than 1 drink(s) a day  Obesity, Body mass index is 35.41 kg/m., BMI >/= 30 associated with increased stroke risk, recommend weight loss, diet and exercise as appropriate   Hx stroke/TIA  Coronary artery disease  Obstructive sleep apnea in history, not on  CPAP at home  Other [facility] day # 1 I have personally obtained history,examined this patient, reviewed notes, independently viewed imaging studies, participated in medical decision making and plan of care.ROS completed by me personally and pertinent positives fully documented  I have made any additions or clarifications directly to the above note. Agree with note above.  Patient had recent right PCA infarct about 2 weeks ago and now presented with left facial droop and slurred speech and MRI shows right watershed infarcts and patient has been known to have critical right carotid stenosis.  She will benefit with early right carotid revascularization with angioplasty stenting.  She was scheduled for this procedure tomorrow in [facility] but due to recent admission previous to get this done at [facility] prior to discharge.  Discussed case with Dr. Earleen Newport  interventionist on-call who reviewed arrange to have this done few days.  Continue aspirin and Brilinta till carotid angioplasty stenting and  keep Eliquis on hold and after the procedure switch to Eliquis and Brilinta.  Patient counseled to quit smoking and is agreeable.  Maintain aggressive risk factor modification.  Greater than 50% time during this 35-minute visit was spent on counseling and coordination of care discussion with care team.  Discussed with patient and daughter, Dr. Lorenda Ishihara and Dr. Earleen Newport.  Antony Contras, MD Medical Director [facility] Pager: [phone] 12/03/2020 4:59 PM   To contact Stroke Continuity provider, please refer to [email or web address] After hours, contact General Neurology
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
## 2020-12-04 DIAGNOSIS — E088 Diabetes mellitus due to underlying condition with unspecified complications: Secondary | ICD-10-CM

## 2020-12-04 DIAGNOSIS — I6300 Cerebral infarction due to thrombosis of unspecified precerebral artery: Secondary | ICD-10-CM

## 2020-12-04 DIAGNOSIS — I10 Essential (primary) hypertension: Secondary | ICD-10-CM

## 2020-12-04 DIAGNOSIS — I639 Cerebral infarction, unspecified: Secondary | ICD-10-CM

## 2020-12-04 DIAGNOSIS — I739 Peripheral vascular disease, unspecified: Secondary | ICD-10-CM

## 2020-12-04 LAB — GLUCOSE, CAPILLARY
Glucose-Capillary: 130 mg/dL — ABNORMAL HIGH (ref 70–99)
Glucose-Capillary: 131 mg/dL — ABNORMAL HIGH (ref 70–99)
Glucose-Capillary: 131 mg/dL — ABNORMAL HIGH (ref 70–99)
Glucose-Capillary: 132 mg/dL — ABNORMAL HIGH (ref 70–99)
Glucose-Capillary: 133 mg/dL — ABNORMAL HIGH (ref 70–99)
Glucose-Capillary: 140 mg/dL — ABNORMAL HIGH (ref 70–99)

## 2020-12-04 LAB — BASIC METABOLIC PANEL
Anion gap: 13 (ref 5–15)
BUN: <5 mg/dL — ABNORMAL LOW (ref 8–23)
CO2: 25 mmol/L (ref 22–32)
Calcium: 9.6 mg/dL (ref 8.9–10.3)
Chloride: 98 mmol/L (ref 98–111)
Creatinine, Ser: 0.47 mg/dL (ref 0.44–1.00)
GFR, Estimated: >60 mL/min (ref >60)
Glucose, Bld: 125 mg/dL — ABNORMAL HIGH (ref 70–99)
Potassium: 3.8 mmol/L (ref 3.5–5.1)
Sodium: 136 mmol/L (ref 135–145)

## 2020-12-04 LAB — URINE CULTURE: Culture: NO GROWTH

## 2020-12-04 LAB — CBC
HCT: 50 % — ABNORMAL HIGH (ref 36.0–46.0)
Hemoglobin: 15.8 g/dL — ABNORMAL HIGH (ref 12.0–15.0)
MCH: 26.8 pg (ref 26.0–34.0)
MCHC: 31.6 g/dL (ref 30.0–36.0)
MCV: 84.7 fL (ref 80.0–100.0)
Platelets: 323 10*3/uL (ref 150–400)
RBC: 5.9 MIL/uL — ABNORMAL HIGH (ref 3.87–5.11)
RDW: 20 % — ABNORMAL HIGH (ref 11.5–15.5)
WBC: 11.1 10*3/uL — ABNORMAL HIGH (ref 4.0–10.5)
nRBC: 0 % (ref 0.0–0.2)

## 2020-12-04 MED ORDER — MELATONIN 3 MG PO TABS
3.0000 mg | ORAL_TABLET | Freq: Every day | ORAL | Status: DC
  Administered 2020-12-04 – 2020-12-05 (×2): 3 mg via ORAL
  Filled 2020-12-04 (×3): qty 1

## 2020-12-04 NOTE — Evaluation (Signed)
Occupational Therapy Evaluation Patient Details Name: Barbara French MRN: [id number] DOB: 1955/11/28 Today's Date: 12/04/2020    History of Present Illness Pt is 65 yo female who presents after falling down 4 steps and being unable to get up, L facial droop, slurred speech. MRI showed extension of R PCA infarct that she had in early 4/22. PMH: R PCA infarct 11/20/20, B carotid stenosis s/p R carotid endarterectomy 12/21 and L stent placement, HTN, DM2, smoker, PE, COPD, R TKA, multiple neck and back surgeries, and narcolepsy. Pt scheduled to have another stent placement 4/27.   Clinical Impression   Patient admitted for above and limited by problem list below, including impaired balance, impaired vision, decreased activity tolerance and impaired cognition.  Pt aware of situation, but no recall of symptoms when stroke extension occurred, she has poor awareness of safety and deficits, decreased problem solving and difficulty following multiple step commands.  She is consistently looking towards family member to assist with answering home setup questions. She reports visual deficits are unchanged from prior CVA, continues to demonstrate L visual field cut but she has poor awareness to deficits and requires cueing to scan, running into obstacles on L side.  She requires min guard for transfers, min assist for in room mobility using RW and up to min assist for ADLs.  Due to her visual, cognition and balance deficits, as well as her history of falls, believe patient will best benefit from CIR level rehab to optimize independence and safety to progress to independent level with ADLs and mobility.  Will follow acutely.     Follow Up Recommendations  CIR    Equipment Recommendations  3 in 1 bedside commode    Recommendations for Other Services Rehab consult;Speech consult     Precautions / Restrictions Precautions Precautions: Fall Precaution Comments: pt has had multiple falls down stairs at home  including one PTA yesterday Restrictions Weight Bearing Restrictions: No      Mobility Bed Mobility Overal bed mobility: Needs Assistance Bed Mobility: Sit to Supine       Sit to supine: Min guard   General bed mobility comments: for safety, cueing to sit on EOB before laying down.    Transfers Overall transfer level: Needs assistance Equipment used: Rolling walker (2 wheeled);None Transfers: Sit to/from Stand Sit to Stand: Min guard         General transfer comment: for safety/balance, cueing for hand placement    Balance Overall balance assessment: Needs assistance;History of Falls Sitting-balance support: No upper extremity supported;Feet supported Sitting balance-Leahy Scale: Good     Standing balance support: No upper extremity supported;During functional activity Standing balance-Leahy Scale: Fair Standing balance comment: pt requires BUE support dynamically                           ADL either performed or assessed with clinical judgement   ADL Overall ADL's : Needs assistance/impaired     Grooming: Minimal assistance;Standing Grooming Details (indicate cue type and reason): min assist to locate items at sink     Lower Body Bathing: Minimal assistance;Sit to/from stand   Upper Body Dressing : Minimal assistance;Sitting   Lower Body Dressing: Minimal assistance;Sit to/from stand Lower Body Dressing Details (indicate cue type and reason): able to don socks, min assist sit to stand for safety/balance Toilet Transfer: Minimal assistance;Ambulation;RW Toilet Transfer Details (indicate cue type and reason): simulated in room         Functional mobility  during ADLs: Minimal assistance;Rolling walker;Cueing for safety General ADL Comments: pt limited by visual deficits, cognition and safety, balance     Vision Baseline Vision/History: Wears glasses Wears Glasses: At all times Patient Visual Report: No change from baseline (L visual deficit  from prior CVA) Vision Assessment?: Yes Eye Alignment: Within Functional Limits Ocular Range of Motion: Within Functional Limits Alignment/Gaze Preference: Within Defined Limits Tracking/Visual Pursuits: Able to track stimulus in all quads without difficulty Visual Fields: Left visual field deficit Additional Comments: pt reports vision has not changed from prior CVA, but pt not compensating for deficits and demonstrate poor awarness of deficits     Perception     Praxis      Pertinent Vitals/Pain Pain Assessment: No/denies pain     Hand Dominance Right   Extremity/Trunk Assessment Upper Extremity Assessment Upper Extremity Assessment: Generalized weakness   Lower Extremity Assessment Lower Extremity Assessment: Defer to PT evaluation       Communication Communication Communication: No difficulties   Cognition Arousal/Alertness: Awake/alert Behavior During Therapy: WFL for tasks assessed/performed Overall Cognitive Status: Impaired/Different from baseline Area of Impairment: Memory;Following commands;Safety/judgement;Awareness;Problem solving                     Memory: Decreased short-term memory Following Commands: Follows one step commands consistently;Follows one step commands with increased time;Follows multi-step commands inconsistently Safety/Judgement: Decreased awareness of safety;Decreased awareness of deficits Awareness: Emergent Problem Solving: Difficulty sequencing;Requires verbal cues General Comments: patient able to report here for CVA, but unable to describe symptoms- family member reportng "she doesn't remember that day".  Patient looking towards family member to assist with answering questions throughout session, at times family member voicing "you tell me you fall and run into things all the time".  Pt with significant limitations and poor awareness of safety and L visual deficits.   General Comments  pt on RA during session    Exercises      Shoulder Instructions      Home Living Family/patient expects to be discharged to:: Private residence Living Arrangements: Children Available Help at Discharge: Family;Available PRN/intermittently Type of Home: House Home Access: Stairs to enter     Home Layout: Two level;1/2 bath on main level Alternate Level Stairs-Number of Steps: 13 Alternate Level Stairs-Rails: Right Bathroom Shower/Tub: Teacher, early years/pre: Standard     Home Equipment: Environmental consultant - 2 wheels;Cane - single point;Shower seat;Grab bars - tub/shower   Additional Comments: pt reports that her daughter and 2 and 55 yo grandchildren live with her. Pt has not driven since CVA earlier this month. She is alone during the day.      Prior Functioning/Environment Level of Independence: Independent with assistive device(s)        Comments: using cane since CVA earlier in April, otherwise independent with ADLs. Daughter reports frequent falls and running into things all the time.        OT Problem List: Decreased strength;Decreased activity tolerance;Impaired balance (sitting and/or standing);Impaired vision/perception;Decreased cognition;Decreased safety awareness;Decreased knowledge of use of DME or AE;Decreased knowledge of precautions      OT Treatment/Interventions: Self-care/ADL training;Neuromuscular education;DME and/or AE instruction;Therapeutic activities;Cognitive remediation/compensation;Visual/perceptual remediation/compensation;Patient/family education;Balance training    OT Goals(Current goals can be found in the care plan section) Acute Rehab OT Goals Patient Stated Goal: get better OT Goal Formulation: With patient Time For Goal Achievement: 12/18/20 Potential to Achieve Goals: Good  OT Frequency: Min 2X/week   Barriers to D/C:  Co-evaluation              AM-PAC OT "6 Clicks" Daily Activity     Outcome Measure Help from another person eating meals?: A Little Help  from another person taking care of personal grooming?: A Little Help from another person toileting, which includes using toliet, bedpan, or urinal?: A Little Help from another person bathing (including washing, rinsing, drying)?: A Little Help from another person to put on and taking off regular upper body clothing?: A Little Help from another person to put on and taking off regular lower body clothing?: A Little 6 Click Score: 18   End of Session Equipment Utilized During Treatment: Gait belt;Rolling walker Nurse Communication: Mobility status;Precautions  Activity Tolerance: Patient tolerated treatment well Patient left: in bed;with bed alarm set;with family/visitor present  OT Visit Diagnosis: Other abnormalities of gait and mobility (R26.89);History of falling (Z91.81);Low vision, both eyes (H54.2);Other symptoms and signs involving cognitive function                Time: 0034-9179 OT Time Calculation (min): 26 min Charges:  OT General Charges $OT Visit: 1 Visit OT Evaluation $OT Eval Moderate Complexity: 1 Mod OT Treatments $Self Care/Home Management : 8-22 mins  Jolaine Artist, OT Acute Rehabilitation Services Pager [phone] Office [phone]   Delight Stare 12/04/2020, 12:37 PM

## 2020-12-04 NOTE — Progress Notes (Signed)
Interventional Radiology Progress Note  65 yo female admitted with symptomatic high grade right ICA stenosis, recurrent after prior CEA.     She denies any new events overnight, and denies any worsening.    Bilateral upper and lower extremity strength symmetric.  No dysphasia appreciated.    Her daughter was there in the room with her today.  She was eating lunch while I was in the room and she was comfortable.   I reviewed our plan for tomorrow and answered questions.    Plan for right ICA stent with embolic protection, GETA.  NPO after midnight. Continue anti-platelet therapy and continue eliquis hold until post stent.   Signed,  Dulcy Fanny. Earleen Newport, DO

## 2020-12-04 NOTE — Progress Notes (Incomplete)
STROKE TEAM PROGRESS NOTE  Brief HPI: Barbara French is a 65 yo female with a PMHx of Right PCA stroke 2 weeks ago at [facility], morbid obesity, PAD, HTN, HLD, tobacco abuse, nocturnal hypoxemia, depression, neuropathy, CAD with remote MI, GERD, DM II, RLS, dysphagia, PE, COPD, anemia, BICA stenosis with recent stenting of left and planned stenting of right. Per family report her only residual deficit after the R PCA stroke 2 weeks ago was L homonymous hemianopsia.   INTERVAL HISTORY No acute events   We discussed her stroke diagnosis She was counseled regarding improving control of her stroke risk factors. She was agreeable to considering smoking cessation.  Questions were answered.   Vitals:   12/03/20 2051 12/04/20 0044 12/04/20 0315 12/04/20 0700  BP: (!) 179/77 (!) 172/89 (!) 171/89 (!) 165/81  Pulse: 95 (!) 101 (!) 101 (!) 101  Resp: 18 18 15 18   Temp: 98 F (36.7 C) 98.7 F (37.1 C) 97.7 F (36.5 C) 97.7 F (36.5 C)  TempSrc:  Oral  Oral  SpO2: 95% 97% 96% 93%  Weight:      Height:       CBC:  Recent Labs  Lab 12/02/20 1330 12/02/20 1338 12/03/20 0257 12/04/20 0209  WBC 12.5*  --  9.0 11.1*  NEUTROABS 9.0*  --   --   --   HGB 13.7   < > 12.5 15.8*  HCT 43.8   < > 39.8 50.0*  MCV 87.1  --  86.7 84.7  PLT 275  --  256 323   < > = values in this interval not displayed.   Basic Metabolic Panel:  Recent Labs  Lab 12/03/20 0257 12/04/20 0209  NA 134* 136  K 4.6 3.8  CL 103 98  CO2 24 25  GLUCOSE 92 125*  BUN 13 <5*  CREATININE 0.50 0.47  CALCIUM 8.2* 9.6   Lipid Panel: No results for input(s): CHOL, TRIG, HDL, CHOLHDL, VLDL, LDLCALC in the last 168 hours. HgbA1c: No results for input(s): HGBA1C in the last 168 hours. Urine Drug Screen:  Recent Labs  Lab 12/03/20 0002  LABOPIA POSITIVE*  COCAINSCRNUR NONE DETECTED  LABBENZ NONE DETECTED  AMPHETMU NONE DETECTED  THCU NONE DETECTED  LABBARB NONE DETECTED    Alcohol Level  Recent Labs   Lab 12/02/20 1330  ETH <10   IMAGING  NCT head  There is no acute intracranial hemorrhage or evidence of acute infarction. ASPECT score is 10. No significant change since recent prior study. Small infarcts on the prior MRI are not well seen. Subacute and chronic infarcts as seen previously.  CTA head and neck and perfusion No significant change since CTA 11/21/2020. No new large vessel occlusion. Remains critical stenosis of the proximal right ICA with near occlusion. Perfusion imaging demonstrates no evidence of core infarction. There is calculated penumbra involving the area of subacute right PCA territory infarction as well as the right MCA watershed territory likely related to above.  MRI Acute/early subacute cortical infarction changes within the right parietal lobe (PCA vascular territory), slightly progressed from the brain MRI of 11/21/2020.  PHYSICAL EXAM Psych: Affect appropriate to situation Eyes: No scleral injection HENT: No OP obstrucion Head: Normocephalic.  Cardiovascular: Normal rate and regular rhythm.  Respiratory: Effort normal . GI: Soft.  No distension. There is no tenderness.  Skin: WDI  Neuro: Mental Status: Patient is awake, alert,. She can state age but not month. Patient is unable to give a clear and  coherent history. Speech/Language:  Mild dysarthria and no aphasia. Fluency, naming, repetition, and comprehensio intact. Cranial Nerves: II: Pupils are equal, round, and reactive to light.   Dense LHH III,IV, VI: EOMI without ptosis or diploplia. V: Facial sensation is symmetric to light touch.  VII: Slight left facial droop. X: Uvula elevates symmetrically XI: Shoulder shrug is symmetric. XII: tongue is midline without atrophy or fasciculations.  Motor: Tone is normal. Bulk is increased. 5/5 strength was present in all four extremities, although patient had difficulty following commands on L side 2/2 neglect. Sensory: Sensation is  symmetric to light touch in the arms and legs. Noted extinction to left with DSS to light touch.   Plantars: Toes are downgoing bilaterally.  Cerebellar: FNF and HKS are intact bilaterally NIHSS 4.  Premorbid modified Rankin 1 ASSESSMENT/PLAN Barbara French is a 65 year old female with past medical history of prior right PCA infarct (4/13), bilateral carotid artery stenosis status post right carotid endarterectomy (07/2020) and left stent placement, hypertension, T2DM, PE on Eliquis, COPD, left homonymous hemianopsia, narcolepsy who presented to the ED after sudden onset of left sided weakness, left facial droop and slurred speech. MRI shows right parietal lobe acute and subacute cortical infarctions along with a few subcentimeter acute/early subacute infarcts within the right frontoparietal white matter. CTA h/n re-demonstrates known right critical ICA stenosis.   Acute and early subacute Right PCA stroke in setting of proximal right ICA critical stenosis    2D Echo from recent admission at [facility] shows EF 60-65%, no shunt, no clot ,   Passed swallow eval for heart healthy diet   On ASA 81mg  and Eliquis prior to admission  Loaded with Brilinta, On ASA 81mg  daily and will begin Brilinta 90 mg BID tomorrow  Eliquis on hold per Dr. Earleen Newport in preprocedure setting  Therapy recommendations:  Outpatient PT, OT pending, 24 hr supervision recommended.   Disposition:  TBD  Right ICA stenosis  Plan for image-guided cerebral arteriogram with possible revascularization (angioplasty/stent placement) of right ICA stenosis in IR tentatively for Thursday 12/05/2020 with Dr. Earleen Newport and anesthesia   Hx of DVT  Hold Eliquis pre-procedure  Hypertension . Permissive hypertension (OK if < 220/120) but gradually normalize in 5-7 days . Long-term BP goal normotensive  Hyperlipidemia  Home meds: High intensity Lipitor 40mg  on board   LDL from 4/13 showing at 40 which is at goal < 70  Continue  statin at discharge  Diabetes type II Controlled   HgbA1c 6.7 at goal 7, on 4/13  CBGs Recent Labs    12/04/20 0049 12/04/20 0312 12/04/20 0627  GLUCAP 131* 133* 132*      SSI  Management per primary team  Other Stroke Risk Factors  Current Cigarette smoker: 24 pack year history, counseled to quit.   Current ETOH use, alcohol level <10, advised to drink no more than 1 drink(s) a day  Obesity, Body mass index is 35.41 kg/m., BMI >/= 30 associated with increased stroke risk, recommend weight loss, diet and exercise as appropriate   Hx stroke/TIA  Coronary artery disease  Obstructive sleep apnea in history, not on CPAP at home  Other [facility] day # 2 To contact Stroke Continuity provider, please refer to [email or web address] After hours, contact General Neurology

## 2020-12-04 NOTE — Progress Notes (Signed)
HD#2 Subjective:  Overnight Events: none  Patient is seen at bedside with her sister.  She states that she is feeling well and denies any new focal weakness or change in her vision.  When discussed outpatient PT after discharge, patient sister states that she will benefit from inpatient rehab.  States that from the last discharge, patient did not participate in home health PT due to her sleep pattern.  Patient states that she will need sleep medication at bedtime to help her sleep at night so she can be awake in the morning.  Objective:  Vital signs in last 24 hours: Vitals:   12/03/20 1553 12/03/20 2051 12/04/20 0044 12/04/20 0315  BP: (!) 155/62 (!) 179/77 (!) 172/89 (!) 171/89  Pulse: 98 95 (!) 101 (!) 101  Resp:  18 18 15   Temp: 98.1 F (36.7 C) 98 F (36.7 C) 98.7 F (37.1 C) 97.7 F (36.5 C)  TempSrc: Oral  Oral   SpO2: 98% 95% 97% 96%  Weight:      Height:       Supplemental O2: Room Air SpO2: 96 % O2 Flow Rate (L/min): 2 L/min   Physical Exam:  Physical Exam Constitutional:      General: She is not in acute distress.    Appearance: She is not toxic-appearing.  Eyes:     General: No scleral icterus.       Right eye: No discharge.        Left eye: No discharge.     Conjunctiva/sclera: Conjunctivae normal.  Cardiovascular:     Rate and Rhythm: Normal rate and regular rhythm.     Heart sounds: Normal heart sounds.  Pulmonary:     Effort: Pulmonary effort is normal. No respiratory distress.  Neurological:     Mental Status: She is alert.     Comments: Facial symmetry Cranial nerves intact 5/5 strength and range of motion of bilateral UE and LE Stable left hemianopia  Psychiatric:        Mood and Affect: Mood normal.        Thought Content: Thought content normal.        Judgment: Judgment normal.     Filed Weights   12/02/20 1300  Weight: 85 kg     Intake/Output Summary (Last 24 hours) at 12/04/2020 0627 Last data filed at 12/03/2020 0800 Gross  per 24 hour  Intake --  Output 250 ml  Net -250 ml   Net IO Since Admission: -562.09 mL [12/04/20 0627]  Pertinent Labs: CBC Latest Ref Rng & Units 12/04/2020 12/03/2020 12/02/2020  WBC 4.0 - 10.5 K/uL 11.1(H) 9.0 -  Hemoglobin 12.0 - 15.0 g/dL 15.8(H) 12.5 15.0  Hematocrit 36.0 - 46.0 % 50.0(H) 39.8 44.0  Platelets 150 - 400 K/uL 323 256 -    CMP Latest Ref Rng & Units 12/04/2020 12/03/2020 12/02/2020  Glucose 70 - 99 mg/dL 125(H) 92 91  BUN 8 - 23 mg/dL <5(L) 13 26(H)  Creatinine 0.44 - 1.00 mg/dL 0.47 0.50 0.80  Sodium 135 - 145 mmol/L 136 134(L) 132(L)  Potassium 3.5 - 5.1 mmol/L 3.8 4.6 5.8(H)  Chloride 98 - 111 mmol/L 98 103 99  CO2 22 - 32 mmol/L 25 24 -  Calcium 8.9 - 10.3 mg/dL 9.6 8.2(L) -  Total Protein 6.5 - 8.1 g/dL - - -  Total Bilirubin 0.3 - 1.2 mg/dL - - -  Alkaline Phos 38 - 126 U/L - - -  AST 15 - 41 U/L - - -  ALT 0 - 44 U/L - - -    Imaging: No results found.  Assessment/Plan:   Principal Problem:   CVA (cerebral vascular accident) [facility]) Active Problems:   Essential hypertension   Diabetes mellitus due to underlying condition with unspecified complications ([place])   Bilateral carotid artery stenosis   Hyponatremia   PAD (peripheral artery disease) (HCC)   Hyperkalemia   Stenosis of right internal carotid artery   Patient Summary: Barbara Turneris a 65 year old female with past medical history of prior right PCA infarct(4/13),bilateral carotid artery stenosis status post right carotid endarterectomy(07/2020)and left stent placement,hypertension,T2DM,PE on Eliquis, COPD,narcolepsy who presented to the ED after sudden onset of left sided weakness, left facial droop and slurred speech,found to have acute CVA of the right parietal and right frontoparietal area.  Acute/subacute CVA History of prior right PCA infarct(11/2020) Rightinternal carotid stenosiss/p CEA 07/2020 No new focal weakness or change in vision.  Continue working with PT/OT.   Will consider if she is qualified for CIR.  - R ICA stent placement on 12/05/2020 - Permissive hypertension< 220/110in the next 24/48 hours.Holding home blood pressure meds.  Avoid hypotension. - Continue atorvastatin, ASA  - Holding Eliquis until after the procedure  -Continue PT/OT.  Will add melatonin at bedtime - Encourage patient on smoking cessation.  Declines nicotine patch  History of PEafter a surgery 3 years ago - Will resume Eliquis after R ICA stent placement  - SCD   Type 2 diabetes Home regimen include metformin and Januvia. A1c of 6.7 on 4/13. Goal A1C < 6.4 CBG at goal - Continue SSI - Monitor CBG  COPD Tobacco use disorder - albuterol PRN - Encourage cessation  - Declined nicotine patch  Lung nodule 10 mm lung nodule found on last CTA. - Follow up outpatient imaging   Diet: heart healthy IVF: NA CODE: Full DVT: brillinta, ASA, SCD Dispo: pending PT/OT recs  Gaylan Gerold, DO 12/04/2020, 6:27 AM Pager: [phone]  Please contact the on call pager after 5 pm and on weekends at [phone].

## 2020-12-04 NOTE — Progress Notes (Signed)
STROKE TEAM PROGRESS NOTE     INTERVAL HISTORY Patient is lying in bed comfortably.  No complaints.  Vital signs stable.  Neurological exam unchanged.  She is scheduled for elective angioplasty stenting tomorrow by Dr. Earleen Newport.  Vitals:   12/04/20 0044 12/04/20 0315 12/04/20 0700 12/04/20 1100  BP: (!) 172/89 (!) 171/89 (!) 165/81 (!) 155/69  Pulse: (!) 101 (!) 101 (!) 101 (!) 105  Resp: 18 15 18 20   Temp: 98.7 F (37.1 C) 97.7 F (36.5 C) 97.7 F (36.5 C) 98.7 F (37.1 C)  TempSrc: Oral  Oral Oral  SpO2: 97% 96% 93% 93%  Weight:      Height:       CBC:  Recent Labs  Lab 12/02/20 1330 12/02/20 1338 12/03/20 0257 12/04/20 0209  WBC 12.5*  --  9.0 11.1*  NEUTROABS 9.0*  --   --   --   HGB 13.7   < > 12.5 15.8*  HCT 43.8   < > 39.8 50.0*  MCV 87.1  --  86.7 84.7  PLT 275  --  256 323   < > = values in this interval not displayed.   Basic Metabolic Panel:  Recent Labs  Lab 12/03/20 0257 12/04/20 0209  NA 134* 136  K 4.6 3.8  CL 103 98  CO2 24 25  GLUCOSE 92 125*  BUN 13 <5*  CREATININE 0.50 0.47  CALCIUM 8.2* 9.6   Lipid Panel: No results for input(s): CHOL, TRIG, HDL, CHOLHDL, VLDL, LDLCALC in the last 168 hours. HgbA1c: No results for input(s): HGBA1C in the last 168 hours. Urine Drug Screen:  Recent Labs  Lab 12/03/20 0002  LABOPIA POSITIVE*  COCAINSCRNUR NONE DETECTED  LABBENZ NONE DETECTED  AMPHETMU NONE DETECTED  THCU NONE DETECTED  LABBARB NONE DETECTED    Alcohol Level  Recent Labs  Lab 12/02/20 1330  ETH <10   IMAGING  NCT head  There is no acute intracranial hemorrhage or evidence of acute infarction. ASPECT score is 10. No significant change since recent prior study. Small infarcts on the prior MRI are not well seen. Subacute and chronic infarcts as seen previously.  CTA head and neck and perfusion No significant change since CTA 11/21/2020. No new large vessel occlusion. Remains critical stenosis of the proximal right ICA  with near occlusion. Perfusion imaging demonstrates no evidence of core infarction. There is calculated penumbra involving the area of subacute right PCA territory infarction as well as the right MCA watershed territory likely related to above.  MRI Acute/early subacute cortical infarction changes within the right parietal lobe (PCA vascular territory), slightly progressed from the brain MRI of 11/21/2020.  PHYSICAL EXAM Psych: Affect appropriate to situation Eyes: No scleral injection HENT: No OP obstrucion Head: Normocephalic.  Cardiovascular: Normal rate and regular rhythm.  Respiratory: Effort normal . GI: Soft.  No distension. There is no tenderness.  Skin: WDI  Neuro: Mental Status: Patient is awake, alert,. She can state age but not month. Patient is unable to give a clear and coherent history. Speech/Language:  Mild dysarthria and no aphasia. Fluency, naming, repetition, and comprehensio intact. Cranial Nerves: II: Pupils are equal, round, and reactive to light.   Dense LHH III,IV, VI: EOMI without ptosis or diploplia. V: Facial sensation is symmetric to light touch.  VII: Slight left facial droop. X: Uvula elevates symmetrically XI: Shoulder shrug is symmetric. XII: tongue is midline without atrophy or fasciculations.  Motor: Tone is normal. Bulk is increased. 5/5 strength was  present in all four extremities, although patient had difficulty following commands on L side 2/2 neglect. Sensory: Sensation is symmetric to light touch in the arms and legs. Noted extinction to left with DSS to light touch.   Plantars: Toes are downgoing bilaterally.  Cerebellar: FNF and HKS are intact bilaterally NIHSS 4.  Premorbid modified Rankin 1 ASSESSMENT/PLAN Barbara French is a 65 year old female with past medical history of prior right PCA infarct (4/13), bilateral carotid artery stenosis status post right carotid endarterectomy (07/2020) and left stent placement, hypertension,  T2DM, PE on Eliquis, COPD, left homonymous hemianopsia, narcolepsy who presented to the ED after sudden onset of left sided weakness, left facial droop and slurred speech. MRI shows right parietal lobe acute and subacute cortical infarctions along with a few subcentimeter acute/early subacute infarcts within the right frontoparietal white matter. CTA h/n re-demonstrates known right critical ICA stenosis.   Acute and early subacute Right PCA stroke in setting of proximal right ICA critical stenosis    2D Echo from recent admission at [facility] shows EF 60-65%, no shunt, no clot ,   Passed swallow eval for heart healthy diet   On ASA 81mg  and Eliquis prior to admission  Loaded with Brilinta, On ASA 81mg  daily and will begin Brilinta 90 mg BID tomorrow  Eliquis on hold per Dr. Earleen Newport in preprocedure setting  Therapy recommendations:  None   Disposition:  TBD  Right ICA stenosis  Plan for image-guided cerebral arteriogram with possible revascularization (angioplasty/stent placement) of right ICA stenosis in IR tentatively for Thursday 12/05/2020 with Dr. Earleen Newport and anesthesia   Hx of DVT  Hold Eliquis pre-procedure  Hypertension . Permissive hypertension (OK if < 220/120) but gradually normalize in 5-7 days . Long-term BP goal normotensive  Hyperlipidemia  Home meds: High intensity Lipitor 40mg  on board   LDL from 4/13 showing at 40 which is at goal < 70  Continue statin at discharge  Diabetes type II Controlled   HgbA1c 6.7 at goal 7, on 4/13  CBGs Recent Labs    12/04/20 0312 12/04/20 0627 12/04/20 1122  GLUCAP 133* 132* 131*      SSI  Management per primary team  Other Stroke Risk Factors  Current Cigarette smoker: 24 pack year history, counseled to quit.   Current ETOH use, alcohol level <10, advised to drink no more than 1 drink(s) a day  Obesity, Body mass index is 35.41 kg/m., BMI >/= 30 associated with increased stroke risk, recommend weight  loss, diet and exercise as appropriate   Hx stroke/TIA  Coronary artery disease  Obstructive sleep apnea in history, not on CPAP at home  Other [facility] day # 2    Continue aspirin and Brilinta till carotid angioplasty stenting tomorrow and  keep Eliquis on hold and after the procedure switch to Eliquis and Brilinta.  Patient counseled to quit smoking and is agreeable.  Maintain aggressive risk factor modification.  Discussed with patient and Dr. Earleen Newport.  Greater than 50% time during this 25-minute visit was spent on counseling and coordination of care discussion with care team.   Antony Contras, MD Medical Director [place] Pager: [phone] 12/04/2020 3:20 PM   To contact Stroke Continuity provider, please refer to [email or web address] After hours, contact General Neurology

## 2020-12-04 NOTE — TOC Initial Note (Signed)
Transition of Care [facility]) - Initial/Assessment Note    Patient Details  Name: Barbara French MRN: [id number] Date of Birth: 01/31/1956  [facility] CM/SW Contact:    Pollie Friar, RN Phone Number: 12/04/2020, 11:04 AM  Clinical Narrative:                 CM met with the patient and her daughter at the bedside. Pt sleeping. Daughter is hopeful for a facility rehab. CM updated her that the recommendations are for outpatient rehab and that she may not qualify under her insurance for a facility rehab. CM has updated OT to assess at her visit today.  Pt also has home oxygen but daughter says she only wears it periodically. ToC following.   Expected Discharge Plan: OP Rehab Barriers to Discharge: Continued Medical Work up   Patient Goals and CMS Choice   CMS Medicare.gov Compare Post Acute Care list provided to:: Patient Choice offered to / list presented to : [place]  Expected Discharge Plan and Services Expected Discharge Plan: OP Rehab   Discharge Planning Services: CM Consult   Living arrangements for the past 2 months: Single Family Home                                      Prior Living Arrangements/Services Living arrangements for the past 2 months: Single Family Home Lives with:: Significant Other Patient language and need for interpreter reviewed:: Yes Do you feel safe going back to the place where you live?: Yes        Care giver support system in place?: No (comment) (sign other and other family work) Current home services: DME (walker/ cane/ shower seat/) Criminal Activity/Legal Involvement Pertinent to Current Situation/Hospitalization: No - Comment as needed  Activities of Daily Living Home Assistive Devices/Equipment: None ADL Screening (condition at time of admission) Patient's cognitive ability adequate to safely complete daily activities?: Yes Is the patient deaf or have difficulty hearing?: No Does the patient have  difficulty seeing, even when wearing glasses/contacts?: Yes Does the patient have difficulty concentrating, remembering, or making decisions?: No Patient able to express need for assistance with ADLs?: Yes Does the patient have difficulty dressing or bathing?: No Independently performs ADLs?: Yes (appropriate for developmental age) Does the patient have difficulty walking or climbing stairs?: No Weakness of Legs: None Weakness of Arms/Hands: Left  Permission Sought/Granted                  Emotional Assessment Appearance:: Appears stated age         Psych Involvement: No (comment)  Admission diagnosis:  Hyperkalemia [E87.5] CVA (cerebral vascular accident) ([place]) [I63.9] Hypoxia [R09.02] Cerebrovascular accident (CVA), unspecified mechanism ([place]) [I63.9] Patient Active Problem List   Diagnosis Date Noted  . Stenosis of right internal carotid artery   . CVA (cerebral vascular accident) ([place]) 12/02/2020  . Hyperkalemia 12/02/2020  . PAD (peripheral artery disease) ([place]) 05/08/2019  . COPD ? GOLD stage/ active smoker 12/20/2018  . Angina pectoris ([place]) 06/27/2018  . Lumbar disc disease with radiculopathy 10/05/2017  . Coronary artery disease involving native coronary artery of native heart with angina pectoris ([place]) 02/23/2017  . Essential hypertension 02/23/2017  . History of pulmonary embolism 02/23/2017  . Neoplasm of larynx 09/29/2016  . Chronic laryngitis 09/29/2016  . RLS (restless legs syndrome) 07/28/2016  . Arthritis 02/12/2016  . Respiratory bronchiolitis interstitial lung disease (  [place]) 02/12/2016  . Moderate COPD (chronic obstructive pulmonary disease) ([place]) 02/12/2016  . Acute respiratory failure ([place]) 01/27/2016  . Anemia 01/27/2016  . Hyponatremia 01/27/2016  . Sepsis ([place]) 01/27/2016  . Asthma 01/27/2016  . S/P total knee replacement 01/20/2016  . Atheroscler of native artery of both legs with intermit claudication ([place]) 12/16/2015  . Bilateral carotid  artery stenosis 12/16/2015  . Post-traumatic osteoarthritis of right knee 07/25/2015  . Cigarette smoker 05/07/2015  . Diabetes mellitus due to underlying condition with unspecified complications ([place]) 09/31/1216  . Dyslipidemia 05/07/2015  . History of carotid endarterectomy 05/07/2015   PCP:  Nicholos Johns, MD Pharmacy:   [id number] [place], [place] - [address] [facility] [address]Phone: [phone]: [phone]    Social Determinants of Health (SDOH) Interventions    Readmission Risk Interventions No flowsheet data found.

## 2020-12-05 ENCOUNTER — Inpatient Hospital Stay (HOSPITAL_COMMUNITY): Payer: Medicare Other

## 2020-12-05 ENCOUNTER — Encounter (HOSPITAL_COMMUNITY)
Admission: EM | Disposition: A | Discharge status: Home / Self Care | Payer: Self-pay | Source: Home / Self Care | Attending: Internal Medicine

## 2020-12-05 ENCOUNTER — Inpatient Hospital Stay (HOSPITAL_COMMUNITY): Payer: Medicare Other | Admitting: Certified Registered Nurse Anesthetist

## 2020-12-05 ENCOUNTER — Encounter (HOSPITAL_COMMUNITY): Payer: Self-pay | Admitting: Student in an Organized Health Care Education/Training Program

## 2020-12-05 HISTORY — PX: IR US GUIDE VASC ACCESS RIGHT: IMG2390

## 2020-12-05 HISTORY — PX: IR INTRAVSC STENT CERV CAROTID W/EMB-PROT MOD SED INCL ANGIO: IMG2303

## 2020-12-05 HISTORY — PX: IR ANGIO VERTEBRAL SEL VERTEBRAL BILAT MOD SED: IMG5369

## 2020-12-05 HISTORY — PX: IR ANGIO INTRA EXTRACRAN SEL COM CAROTID INNOMINATE UNI R MOD SED: IMG5359

## 2020-12-05 HISTORY — PX: RADIOLOGY WITH ANESTHESIA: SHX6223

## 2020-12-05 LAB — MRSA PCR SCREENING: MRSA by PCR: NEGATIVE

## 2020-12-05 LAB — CBC
HCT: 52.6 % — ABNORMAL HIGH (ref 36.0–46.0)
Hemoglobin: 17 g/dL — ABNORMAL HIGH (ref 12.0–15.0)
MCH: 26.8 pg (ref 26.0–34.0)
MCHC: 32.3 g/dL (ref 30.0–36.0)
MCV: 82.8 fL (ref 80.0–100.0)
Platelets: 383 10*3/uL (ref 150–400)
RBC: 6.35 MIL/uL — ABNORMAL HIGH (ref 3.87–5.11)
RDW: 20.2 % — ABNORMAL HIGH (ref 11.5–15.5)
WBC: 15.7 10*3/uL — ABNORMAL HIGH (ref 4.0–10.5)
nRBC: 0 % (ref 0.0–0.2)

## 2020-12-05 LAB — BASIC METABOLIC PANEL
Anion gap: 12 (ref 5–15)
BUN: 8 mg/dL (ref 8–23)
CO2: 24 mmol/L (ref 22–32)
Calcium: 9.7 mg/dL (ref 8.9–10.3)
Chloride: 98 mmol/L (ref 98–111)
Creatinine, Ser: 0.5 mg/dL (ref 0.44–1.00)
GFR, Estimated: >60 mL/min (ref >60)
Glucose, Bld: 125 mg/dL — ABNORMAL HIGH (ref 70–99)
Potassium: 3.9 mmol/L (ref 3.5–5.1)
Sodium: 134 mmol/L — ABNORMAL LOW (ref 135–145)

## 2020-12-05 LAB — GLUCOSE, CAPILLARY
Glucose-Capillary: 140 mg/dL — ABNORMAL HIGH (ref 70–99)
Glucose-Capillary: 137 mg/dL — ABNORMAL HIGH (ref 70–99)
Glucose-Capillary: 106 mg/dL — ABNORMAL HIGH (ref 70–99)
Glucose-Capillary: 244 mg/dL — ABNORMAL HIGH (ref 70–99)

## 2020-12-05 SURGERY — IR WITH ANESTHESIA
Anesthesia: General

## 2020-12-05 MED ORDER — IOHEXOL 300 MG/ML  SOLN
150.0000 mL | Freq: Once | INTRAMUSCULAR | Status: AC | PRN
  Administered 2020-12-05: 75 mL via INTRA_ARTERIAL

## 2020-12-05 MED ORDER — AMLODIPINE BESYLATE 5 MG PO TABS
5.0000 mg | ORAL_TABLET | Freq: Every morning | ORAL | Status: DC
  Administered 2020-12-06: 5 mg via ORAL
  Filled 2020-12-05: qty 1

## 2020-12-05 MED ORDER — LABETALOL HCL 5 MG/ML IV SOLN
5.0000 mg | INTRAVENOUS | Status: DC | PRN
  Administered 2020-12-06 (×3): 5 mg via INTRAVENOUS
  Filled 2020-12-05: qty 4

## 2020-12-05 MED ORDER — PHENYLEPHRINE 40 MCG/ML (10ML) SYRINGE FOR IV PUSH (FOR BLOOD PRESSURE SUPPORT)
PREFILLED_SYRINGE | INTRAVENOUS | Status: DC | PRN
  Administered 2020-12-05: 80 ug via INTRAVENOUS
  Administered 2020-12-05: 40 ug via INTRAVENOUS

## 2020-12-05 MED ORDER — CLEVIDIPINE BUTYRATE 0.5 MG/ML IV EMUL: 0.0000 mg/h | INTRAVENOUS | Status: DC

## 2020-12-05 MED ORDER — FENTANYL CITRATE (PF) 100 MCG/2ML IJ SOLN
25.0000 ug | INTRAMUSCULAR | Status: DC | PRN
  Administered 2020-12-05 (×2): 50 ug via INTRAVENOUS

## 2020-12-05 MED ORDER — LIDOCAINE HCL 1 % IJ SOLN
INTRAMUSCULAR | Status: AC
  Filled 2020-12-05: qty 20

## 2020-12-05 MED ORDER — ORAL CARE MOUTH RINSE: 15.0000 mL | Freq: Once | OROMUCOSAL | Status: AC

## 2020-12-05 MED ORDER — CHLORHEXIDINE GLUCONATE CLOTH 2 % EX PADS
6.0000 | MEDICATED_PAD | Freq: Every day | CUTANEOUS | Status: DC
  Administered 2020-12-05 – 2020-12-06 (×2): 6 via TOPICAL

## 2020-12-05 MED ORDER — SUGAMMADEX SODIUM 200 MG/2ML IV SOLN
INTRAVENOUS | Status: DC | PRN
  Administered 2020-12-05: 200 mg via INTRAVENOUS

## 2020-12-05 MED ORDER — OXYCODONE HCL 5 MG PO TABS
ORAL_TABLET | ORAL | Status: AC
  Filled 2020-12-05: qty 1

## 2020-12-05 MED ORDER — CYCLOBENZAPRINE HCL 10 MG PO TABS
10.0000 mg | ORAL_TABLET | Freq: Three times a day (TID) | ORAL | Status: DC
  Administered 2020-12-05 – 2020-12-06 (×2): 10 mg via ORAL
  Filled 2020-12-05 (×2): qty 1

## 2020-12-05 MED ORDER — HEPARIN SODIUM (PORCINE) 1000 UNIT/ML IJ SOLN
INTRAMUSCULAR | Status: AC
  Filled 2020-12-05: qty 1

## 2020-12-05 MED ORDER — CHLORHEXIDINE GLUCONATE 0.12 % MT SOLN
15.0000 mL | Freq: Once | OROMUCOSAL | Status: AC
  Administered 2020-12-05: 15 mL via OROMUCOSAL

## 2020-12-05 MED ORDER — ROCURONIUM BROMIDE 10 MG/ML (PF) SYRINGE
PREFILLED_SYRINGE | INTRAVENOUS | Status: DC | PRN
  Administered 2020-12-05: 10 mg via INTRAVENOUS
  Administered 2020-12-05: 50 mg via INTRAVENOUS

## 2020-12-05 MED ORDER — OXYCODONE HCL 5 MG PO TABS
5.0000 mg | ORAL_TABLET | Freq: Once | ORAL | Status: AC | PRN
  Administered 2020-12-05: 5 mg via ORAL

## 2020-12-05 MED ORDER — IOHEXOL 300 MG/ML  SOLN: 150.0000 mL | Freq: Once | INTRAMUSCULAR | Status: DC | PRN

## 2020-12-05 MED ORDER — FENTANYL CITRATE (PF) 100 MCG/2ML IJ SOLN
INTRAMUSCULAR | Status: AC
  Filled 2020-12-05: qty 2

## 2020-12-05 MED ORDER — LIDOCAINE 2% (20 MG/ML) 5 ML SYRINGE
INTRAMUSCULAR | Status: DC | PRN
  Administered 2020-12-05: 100 mg via INTRAVENOUS

## 2020-12-05 MED ORDER — LACTATED RINGERS IV SOLN: INTRAVENOUS | Status: DC

## 2020-12-05 MED ORDER — PHENYLEPHRINE HCL-NACL 10-0.9 MG/250ML-% IV SOLN
INTRAVENOUS | Status: DC | PRN
  Administered 2020-12-05: 25 ug/min via INTRAVENOUS

## 2020-12-05 MED ORDER — EPTIFIBATIDE 20 MG/10ML IV SOLN
INTRAVENOUS | Status: AC
  Filled 2020-12-05: qty 10

## 2020-12-05 MED ORDER — ONDANSETRON HCL 4 MG/2ML IJ SOLN
INTRAMUSCULAR | Status: DC | PRN
  Administered 2020-12-05: 4 mg via INTRAVENOUS

## 2020-12-05 MED ORDER — IOHEXOL 300 MG/ML  SOLN
150.0000 mL | Freq: Once | INTRAMUSCULAR | Status: AC | PRN
  Administered 2020-12-05: 20 mL via INTRA_ARTERIAL

## 2020-12-05 MED ORDER — CLEVIDIPINE BUTYRATE 0.5 MG/ML IV EMUL
INTRAVENOUS | Status: AC
  Filled 2020-12-05: qty 50

## 2020-12-05 MED ORDER — HYDROMORPHONE HCL 2 MG PO TABS
2.0000 mg | ORAL_TABLET | Freq: Four times a day (QID) | ORAL | Status: DC | PRN
  Administered 2020-12-05: 2 mg via ORAL
  Filled 2020-12-05: qty 1

## 2020-12-05 MED ORDER — CLEVIDIPINE BUTYRATE 0.5 MG/ML IV EMUL
INTRAVENOUS | Status: DC | PRN
  Administered 2020-12-05: 2 mg/h via INTRAVENOUS

## 2020-12-05 MED ORDER — PROPOFOL 10 MG/ML IV BOLUS
INTRAVENOUS | Status: DC | PRN
  Administered 2020-12-05: 130 mg via INTRAVENOUS
  Administered 2020-12-05 (×2): 30 mg via INTRAVENOUS
  Administered 2020-12-05: 60 mg via INTRAVENOUS
  Administered 2020-12-05: 40 mg via INTRAVENOUS

## 2020-12-05 MED ORDER — HEPARIN SODIUM (PORCINE) 1000 UNIT/ML IJ SOLN
INTRAMUSCULAR | Status: DC | PRN
  Administered 2020-12-05: 5000 [IU] via INTRAVENOUS

## 2020-12-05 MED ORDER — FENTANYL CITRATE (PF) 100 MCG/2ML IJ SOLN
INTRAMUSCULAR | Status: DC | PRN
  Administered 2020-12-05: 100 ug via INTRAVENOUS

## 2020-12-05 MED ORDER — GABAPENTIN 300 MG PO CAPS
300.0000 mg | ORAL_CAPSULE | Freq: Three times a day (TID) | ORAL | Status: DC
  Administered 2020-12-05 – 2020-12-06 (×2): 300 mg via ORAL
  Filled 2020-12-05 (×2): qty 1

## 2020-12-05 MED ORDER — OXYCODONE HCL 5 MG/5ML PO SOLN
5.0000 mg | Freq: Once | ORAL | Status: AC | PRN
Start: 2020-12-05 — End: 2020-12-05

## 2020-12-05 MED ORDER — PROMETHAZINE HCL 25 MG/ML IJ SOLN: 6.2500 mg | INTRAMUSCULAR | Status: DC | PRN

## 2020-12-05 MED ORDER — ESMOLOL HCL 100 MG/10ML IV SOLN
INTRAVENOUS | Status: DC | PRN
  Administered 2020-12-05: 30 mg via INTRAVENOUS

## 2020-12-05 NOTE — Anesthesia Procedure Notes (Signed)
Procedure Name: Intubation Date/Time: 12/05/2020 11:31 AM Performed by: Candis Shine, CRNA Pre-anesthesia Checklist: Patient identified, Emergency Drugs available, Suction available and Patient being monitored Patient Re-evaluated:Patient Re-evaluated prior to induction Oxygen Delivery Method: Circle System Utilized Preoxygenation: Pre-oxygenation with 100% oxygen Induction Type: IV induction Ventilation: Mask ventilation without difficulty Laryngoscope Size: Glidescope and 3 Grade View: Grade I Tube type: Oral Tube size: 7.0 mm Number of attempts: 1 Airway Equipment and Method: Video-laryngoscopy and Rigid stylet Placement Confirmation: ETT inserted through vocal cords under direct vision,  positive ETCO2 and breath sounds checked- equal and bilateral Secured at: 22 cm Tube secured with: Tape Dental Injury: Teeth and Oropharynx as per pre-operative assessment  Difficulty Due To: Difficult Airway- due to dentition and Difficult Airway- due to reduced neck mobility

## 2020-12-05 NOTE — Progress Notes (Signed)
   12/05/20 1125  RLE Neurovascular Assessment  R Posterior Tibial Pulse Doppler  R Dorsalis Pedis Pulse +2  LLE Neurovascular Assessment  L Posterior Tibial Pulse Doppler  L Dorsalis Pedis Pulse +2    Pre-procedure distal pulses obtained.

## 2020-12-05 NOTE — Progress Notes (Signed)
PT Cancellation Note  Patient Details Name: Barbara French MRN: [id number] DOB: 05-06-1956   Cancelled Treatment:    Reason Eval/Treat Not Completed: Patient at procedure or test/unavailable   Shamel Galyean B Shayla Heming 12/05/2020, 10:59 AM  Bayard Males, PT Acute Rehabilitation Services Pager: [phone] Office: [phone]

## 2020-12-05 NOTE — Sedation Documentation (Signed)
Arrived to IR 2 with CRNA and Anesthesiologist. Procedure will be under General Anesthesia. See related/associated Anesthesia encounter.

## 2020-12-05 NOTE — Anesthesia Preprocedure Evaluation (Addendum)
Anesthesia Evaluation  Patient identified by MRN, date of birth, ID band Patient awake    Reviewed: Allergy & Precautions, NPO status , Patient's Chart, lab work & pertinent test results  History of Anesthesia Complications Negative for: history of anesthetic complications  Airway Mallampati: II  TM Distance: >3 FB Neck ROM: Full    Dental  (+) Missing,    Pulmonary asthma , COPD,  COPD inhaler, Current Smoker and Patient abstained from smoking., PE (2018)   Pulmonary exam normal        Cardiovascular hypertension, Pt. on medications + CAD, + Past MI and + Peripheral Vascular Disease  Normal cardiovascular exam  Carotid stenosis  Nuclear stress 2020: EF 56%, low risk study   Neuro/Psych Anxiety Depression TIACVA    GI/Hepatic Neg liver ROS, GERD  Medicated and Controlled,  Endo/Other  diabetes, Type 2, Oral Hypoglycemic Agents  Renal/GU negative Renal ROS  negative genitourinary   Musculoskeletal  (+) Arthritis ,   Abdominal   Peds  Hematology negative hematology ROS (+)   Anesthesia Other Findings Day of surgery medications reviewed with patient.  Reproductive/Obstetrics negative OB ROS                           Anesthesia Physical Anesthesia Plan  ASA: IV  Anesthesia Plan: General   Post-op Pain Management:    Induction: Intravenous  PONV Risk Score and Plan: 3 and Treatment may vary due to age or medical condition, Ondansetron, Dexamethasone and Midazolam  Airway Management Planned: Oral ETT  Additional Equipment: Arterial line  Intra-op Plan:   Post-operative Plan: Extubation in OR  Informed Consent: I have reviewed the patients History and Physical, chart, labs and discussed the procedure including the risks, benefits and alternatives for the proposed anesthesia with the patient or authorized representative who has indicated his/her understanding and acceptance.      Dental advisory given  Plan Discussed with: CRNA  Anesthesia Plan Comments:       Anesthesia Quick Evaluation

## 2020-12-05 NOTE — Sedation Documentation (Signed)
6 fr angioseal deployed at 1316, guaze and tegaderm applied. Handoff with Elta Guadeloupe, RN in PACU at [phone].  Site level 0, dressing remains clean/dry/intact.

## 2020-12-05 NOTE — Procedures (Addendum)
Neuro-Interventional Radiology  Post Cerebral Angiogram/Intervention Procedure Note  Operator:    Dr. Earleen Newport Assistant:   None  History:   65 yo female with symptomatic right ICA recurrent high grade ICA stenosis, SP carotid EA December 2021, high risk for surgery.   Baseline mRS:  0  Procedure: US guided right CFA access Cervical & Cerebral Angiogram Right ICA stent with distal embolic cerebral protection.  8-6 x 40 XAct stent Deployment of Angioseal  Findings:   Patent stent with improved lumen, <30% residual  Post -  Extubation:   Neuro intact.  CN's intact.  Gross motor 4 extremities intact, with symmetric strength.   Anesthesia:   GETA  EBL:    ~91TA     Complication:  None   Medication:   5000 Unit heparin  Recommendations: - ICU observation overnight - right hip straight x 4 hours.  - Goal SBP 120-140.   - Frequent NV checks - NIR to follow  - anticipate continuing single anti-platelet (brilinta x 30 days, then to plavix), and restart of eliquis tomorrow once goals met  Signed,  Dulcy Fanny. Earleen Newport, DO

## 2020-12-05 NOTE — Progress Notes (Signed)
    HD#3 Subjective:  No significant overnight events. Somewhat tearful and nervous about the procedure this morning but is ready to get it done.   Objective:  Vital signs in last 24 hours: Vitals:   12/04/20 1500 12/04/20 2018 12/04/20 2322 12/05/20 0328  BP: (!) 156/86 138/63 (!) 160/89 (!) 170/77  Pulse: 93 96 89 95  Resp: 18 17 19 18   Temp: 98.6 F (37 C) 97.9 F (36.6 C) 97.6 F (36.4 C) 97.8 F (36.6 C)  TempSrc: Oral Oral Oral Oral  SpO2: 98% 90% (!) 89% 93%  Weight:      Height:         Physical Exam:   General: chronically ill appearing, tearful Cardiac: RRR Pulm: breathing comfortably on room air Neuro: a/o x4, 5/5 strength in all extremities. Mild left facial droop  Assessment/Plan:   Principal Problem:   CVA (cerebral vascular accident) ([place]) Active Problems:   Essential hypertension   Diabetes mellitus due to underlying condition with unspecified complications ([place])   Bilateral carotid artery stenosis   Hyponatremia   PAD (peripheral artery disease) ([facility])   Hyperkalemia   Stenosis of right internal carotid artery   Patient Summary: Barbara Turneris a 65 year old female with past medical history of prior right PCA infarct(4/13),bilateral carotid artery stenosis status post right carotid endarterectomy(07/2020)and left stent placement,hypertension,T2DM,PE on Eliquis, COPD,narcolepsy who presented to the ED after sudden onset of left sided weakness, left facial droop and slurred speech,found to have acute CVA of the right parietal and right frontoparietal area.  Acute/subacute right PCA infarct in the setting of right ICA stenosis s/p CEA 07/2020 History of prior right PCA infarct(11/2020) - Will undergo stent placement to the ICA today - Resume DAPT with asprin and brillinta after today's procedure - Continue atorvastatin -Continue PT/OT.  Will await re-evaluation from PT/OT for recommendations on ongoing therapy after  discharge.  #Polycythemia, leukocytosis. Hgb up from admission 14>17. Looking back, it seems that hgb has been fluctuant but baseline seems to be around 15. Suspect that her polycythemia is secondary to significant smoking history. With the increase in her white count as well, I considered hemoconcentration, which may be partially attributable. -would consider ruling out PCV with an EPO level after contrast, from today's procedure, has time to filter out  #History of PEafter a surgery 3 years ago.  - Will resume Eliquis after R ICA stent placement    #Type 2 diabetes. Continue SSI #COPD, Tobacco use disorder. Prn albuterol. Encourage smoking cessation  #Lung nodule 10 mm lung nodule found on last CTA. - Follow up outpatient imaging   CODE: Full DVT: SCD Dispo: pending PT/OT recs  Mitzi Hansen, MD Internal Medicine Resident PGY-2 [facility] Pager: [phone] 12/05/2020 7:22 AM     Please contact the on call pager after 5 pm and on weekends at [phone].

## 2020-12-05 NOTE — Progress Notes (Signed)
STROKE TEAM PROGRESS NOTE     INTERVAL HISTORY Neurological exam unchanged.  She is scheduled for elective angioplasty stenting today by Dr. Earleen Newport.  Vitals:   12/05/20 1358 12/05/20 1413 12/05/20 1458 12/05/20 1501  BP: 97/66 114/67 123/75 123/75  Pulse: 91 86 91 92  Resp: 13 12 16 17   Temp:    97.6 F (36.4 C)  TempSrc:      SpO2: (!) 89% (!) 89%  95%  Weight:      Height:       CBC:  Recent Labs  Lab 12/02/20 1330 12/02/20 1338 12/04/20 0209 12/05/20 0311  WBC 12.5*   < > 11.1* 15.7*  NEUTROABS 9.0*  --   --   --   HGB 13.7   < > 15.8* 17.0*  HCT 43.8   < > 50.0* 52.6*  MCV 87.1   < > 84.7 82.8  PLT 275   < > 323 383   < > = values in this interval not displayed.   Basic Metabolic Panel:  Recent Labs  Lab 12/04/20 0209 12/05/20 0311  NA 136 134*  K 3.8 3.9  CL 98 98  CO2 25 24  GLUCOSE 125* 125*  BUN <5* 8  CREATININE 0.47 0.50  CALCIUM 9.6 9.7   Lipid Panel: No results for input(s): CHOL, TRIG, HDL, CHOLHDL, VLDL, LDLCALC in the last 168 hours. HgbA1c: No results for input(s): HGBA1C in the last 168 hours. Urine Drug Screen:  Recent Labs  Lab 12/03/20 0002  LABOPIA POSITIVE*  COCAINSCRNUR NONE DETECTED  LABBENZ NONE DETECTED  AMPHETMU NONE DETECTED  THCU NONE DETECTED  LABBARB NONE DETECTED    Alcohol Level  Recent Labs  Lab 12/02/20 1330  ETH <10   IMAGING  NCT head  There is no acute intracranial hemorrhage or evidence of acute infarction. ASPECT score is 10. No significant change since recent prior study. Small infarcts on the prior MRI are not well seen. Subacute and chronic infarcts as seen previously.  CTA head and neck and perfusion No significant change since CTA 11/21/2020. No new large vessel occlusion. Remains critical stenosis of the proximal right ICA with near occlusion. Perfusion imaging demonstrates no evidence of core infarction. There is calculated penumbra involving the area of subacute right PCA territory  infarction as well as the right MCA watershed territory likely related to above.  MRI Acute/early subacute cortical infarction changes within the right parietal lobe (PCA vascular territory), slightly progressed from the brain MRI of 11/21/2020.  PHYSICAL EXAM Psych: Affect appropriate to situation Eyes: No scleral injection HENT: No OP obstrucion Head: Normocephalic.  Cardiovascular: Normal rate and regular rhythm.  Respiratory: Effort normal . GI: Soft.  No distension. There is no tenderness.  Skin: WDI  Neuro: Mental Status: Patient is awake, alert,. She can state age but not month. Patient is unable to give a clear and coherent history. Speech/Language:  Mild dysarthria and no aphasia. Fluency, naming, repetition, and comprehensio intact. Cranial Nerves: II: Pupils are equal, round, and reactive to light.   Dense LHH III,IV, VI: EOMI without ptosis or diploplia. V: Facial sensation is symmetric to light touch.  VII: Slight left facial droop. X: Uvula elevates symmetrically XI: Shoulder shrug is symmetric. XII: tongue is midline without atrophy or fasciculations.  Motor: Tone is normal. Bulk is increased. 5/5 strength was present in all four extremities, although patient had difficulty following commands on L side 2/2 neglect. Sensory: Sensation is symmetric to light touch in the arms  and legs. Noted extinction to left with DSS to light touch.   Plantars: Toes are downgoing bilaterally.  Cerebellar: FNF and HKS are intact bilaterally NIHSS 4.  Premorbid modified Rankin 1 ASSESSMENT/PLAN Barbara French is a 65 year old female with past medical history of prior right PCA infarct (4/13), bilateral carotid artery stenosis status post right carotid endarterectomy (07/2020) and left stent placement, hypertension, T2DM, PE on Eliquis, COPD, left homonymous hemianopsia, narcolepsy who presented to the ED after sudden onset of left sided weakness, left facial droop and slurred  speech. MRI shows right parietal lobe acute and subacute cortical infarctions along with a few subcentimeter acute/early subacute infarcts within the right frontoparietal white matter. CTA h/n re-demonstrates known right critical ICA stenosis.   Acute and early subacute Right PCA stroke in setting of proximal right ICA critical stenosis    2D Echo from recent admission at [facility] shows EF 60-65%, no shunt, no clot ,   Passed swallow eval for heart healthy diet   On ASA 81mg  and Eliquis prior to admission  Loaded with Brilinta, On ASA 81mg  daily and  Brilinta 90 mg BID   Eliquis on hold per Dr. Earleen Newport in preprocedure setting. Plan to start Eliquis and Brilinta post procedure at Dr. Pasty Arch discretion.   Therapy recommendations:  None   Disposition:  TBD  Right ICA stenosis  Plan for image-guided cerebral arteriogram with possible revascularization (angioplasty/stent placement) of right ICA stenosis in IR tentatively for Thursday 12/05/2020 with Dr. Earleen Newport and anesthesia   Hx of DVT  Hold Eliquis pre-procedure  Hypertension . Permissive hypertension (OK if < 220/120) but gradually normalize in 5-7 days . Long-term BP goal normotensive  Hyperlipidemia  Home meds: High intensity Lipitor 40mg  on board   LDL from 4/13 showing at 40 which is at goal < 70  Continue statin at discharge  Diabetes type II Controlled   HgbA1c 6.7 at goal 7, on 4/13  CBGs Recent Labs    12/04/20 2114 12/05/20 0636 12/05/20 1354  GLUCAP 130* 140* 137*      SSI  Management per primary team  Other Stroke Risk Factors  Current Cigarette smoker: 24 pack year history, counseled to quit.   Current ETOH use, alcohol level <10, advised to drink no more than 1 drink(s) a day  Obesity, Body mass index is 35.41 kg/m., BMI >/= 30 associated with increased stroke risk, recommend weight loss, diet and exercise as appropriate   Hx stroke/TIA  Coronary artery disease  Obstructive sleep  apnea in history, not on CPAP at home  Other [facility] day # 3   To contact Stroke Continuity provider, please refer to [email or web address] After hours, contact General Neurology

## 2020-12-05 NOTE — Anesthesia Procedure Notes (Signed)
Arterial Line Insertion Start/End4/28/2022 10:50 AM Performed by: Candis Shine, CRNA, CRNA  Patient location: Pre-op. Preanesthetic checklist: patient identified, IV checked, site marked, risks and benefits discussed, surgical consent, monitors and equipment checked, pre-op evaluation, timeout performed and anesthesia consent Lidocaine 1% used for infiltration Left, radial was placed Catheter size: 20 G Hand hygiene performed  and maximum sterile barriers used   Attempts: 1 Procedure performed without using ultrasound guided technique. Following insertion, dressing applied and Biopatch. Post procedure assessment: normal and unchanged  Patient tolerated the procedure well with no immediate complications.

## 2020-12-05 NOTE — Care Management Important Message (Signed)
Important Message  Patient Details  Name: Barbara French MRN: [id number] Date of Birth: March 18, 1956   Medicare Important Message Given:  Yes     [name] [address] 12/05/2020, 12:14 PM

## 2020-12-05 NOTE — Progress Notes (Signed)
   12/05/20 1100  OT Visit Information  Last OT Received On 12/05/20  Reason Eval/Treat Not Completed Patient at procedure or test/ unavailable. Plan to re-attempt at a later time/date.  History of Present Illness Pt is 64 yo female who presents after falling down 4 steps and being unable to get up, L facial droop, slurred speech. MRI showed extension of R PCA infarct from 4/13/222. Pt scheduled for Rt ICA stent 4/28. PMH: Bil carotid stenosis s/p R carotid endarterectomy 12/21 and L stent placement, HTN, DM2, smoker, PE, COPD, R TKA, multiple neck and back surgeries, and narcolepsy.    Tyrone Schimke, OT Acute Rehabilitation Services Pager: [phone] Office: [phone]

## 2020-12-05 NOTE — Transfer of Care (Signed)
Immediate Anesthesia Transfer of Care Note  Patient: Barbara French  Procedure(s) Performed: Stent placement right carotid artery (N/A )  Patient Location: PACU  Anesthesia Type:General  Level of Consciousness: awake, alert  and oriented  Airway & Oxygen Therapy: Patient Spontanous Breathing and Patient connected to face mask oxygen  Post-op Assessment: Report given to RN and Post -op Vital signs reviewed and stable  Post vital signs: Reviewed and stable  Last Vitals:  Vitals Value Taken Time  BP 112/51 12/05/20 1343  Temp    Pulse 83 12/05/20 1347  Resp 22 12/05/20 1347  SpO2 96 % 12/05/20 1347  Vitals shown include unvalidated device data.  Last Pain:  Vitals:   12/05/20 0749  TempSrc: Oral  PainSc: 0-No pain      Patients Stated Pain Goal: 3 (85/99/23 4144)  Complications: No complications documented.

## 2020-12-05 NOTE — H&P (Signed)
INTERVAL BRIEF H&P  Subjective: Pt scheduled for right ICA stent with embolic protection under general anesthesia.  She is resting in bed this am, daughter at bedside. Reports some mild dizziness, which she has had intermittently. Has been NPO as directed Has been receiving Brilinta as ordered.  Objective: BP (!) 159/72 (BP Location: Right Arm)   Pulse 95   Temp (!) 97.5 F (36.4 C) (Oral)   Resp 18   Ht 5\' 1"  (1.549 m) Comment: from 11/20/2020  Wt 85 kg   SpO2 97%   BMI 35.41 kg/m  General: NAD Lungs: CTA without w/r/r Heart: Regular  Assessment: Symptomatic (R)ICA stenosis  Plan: For angiogram with (R)ICA stenosis angioplasty today. Continue Brilinta Reviewed procedure, no new questions.  Ascencion Dike PA-C Interventional Radiology 12/05/2020 10:05 AM

## 2020-12-06 ENCOUNTER — Encounter (HOSPITAL_COMMUNITY): Payer: Self-pay | Admitting: Interventional Radiology

## 2020-12-06 LAB — CBC
HCT: 46.4 % — ABNORMAL HIGH (ref 36.0–46.0)
Hemoglobin: 14.5 g/dL (ref 12.0–15.0)
MCH: 26.8 pg (ref 26.0–34.0)
MCHC: 31.3 g/dL (ref 30.0–36.0)
MCV: 85.8 fL (ref 80.0–100.0)
Platelets: 384 10*3/uL (ref 150–400)
RBC: 5.41 MIL/uL — ABNORMAL HIGH (ref 3.87–5.11)
RDW: 20.1 % — ABNORMAL HIGH (ref 11.5–15.5)
WBC: 13.8 10*3/uL — ABNORMAL HIGH (ref 4.0–10.5)
nRBC: 0 % (ref 0.0–0.2)

## 2020-12-06 LAB — GLUCOSE, CAPILLARY
Glucose-Capillary: 125 mg/dL — ABNORMAL HIGH (ref 70–99)
Glucose-Capillary: 190 mg/dL — ABNORMAL HIGH (ref 70–99)

## 2020-12-06 MED ORDER — TICAGRELOR 90 MG PO TABS: 90.0000 mg | ORAL_TABLET | Freq: Two times a day (BID) | ORAL | 0 refills | Status: AC

## 2020-12-06 MED ORDER — APIXABAN 5 MG PO TABS
5.0000 mg | ORAL_TABLET | Freq: Two times a day (BID) | ORAL | Status: DC
  Administered 2020-12-06: 5 mg via ORAL
  Filled 2020-12-06: qty 1

## 2020-12-06 NOTE — Anesthesia Postprocedure Evaluation (Signed)
Anesthesia Post Note  Patient: Barbara French  Procedure(s) Performed: Stent placement right carotid artery (N/A )     Patient location during evaluation: PACU Anesthesia Type: General Level of consciousness: awake and alert and oriented Pain management: pain level controlled Vital Signs Assessment: post-procedure vital signs reviewed and stable Respiratory status: spontaneous breathing, nonlabored ventilation and respiratory function stable Cardiovascular status: blood pressure returned to baseline Postop Assessment: no apparent nausea or vomiting Anesthetic complications: no   No complications documented.        Brennan Bailey

## 2020-12-06 NOTE — Progress Notes (Signed)
Occupational Therapy Treatment Patient Details Name: Barbara French MRN: [id number] DOB: 10-29-1955 Today's Date: 12/06/2020    History of present illness Pt is 65 yo female who presents after falling down 4 steps and being unable to get up, L facial droop, slurred speech. MRI showed extension of R PCA infarct from 4/13/222. Pt scheduled for Rt ICA stent 4/28. PMH: Bil carotid stenosis s/p R carotid endarterectomy 12/21 and L stent placement, HTN, DM2, smoker, PE, COPD, R TKA, multiple neck and back surgeries, and narcolepsy.   OT comments  Pt is now able to complete ADLs with supervision.  She does require min cues to locate items, but can find them if given increased time to do so in a structured, familiar environment.  She require more direct supervision and cuing to navigate a busier, unstructured or unfamiliar environment due to difficulty with fully scanning her environment.  She also demonstrates deficits with memory and executive functions.  Recommend she optimally have 24 hour supervision; direct supervision with medications, financial management, cooking.  Recommend NO driving, and DIRECT assist/supervision if going outside or in the community.  Optimally feel OP therapies will challenge her and prepare her for community activities, but this may not be an option for her, and if not, then recommend [place].    Follow Up Recommendations  Outpatient OT;Home health OT    Equipment Recommendations  None recommended by OT    Recommendations for Other Services      Precautions / Restrictions Precautions Precautions: Fall Precaution Comments: pt has had multiple falls down stairs at home including one PTA yesterday; however, pt reports these have been associated with the occurrence of the strokes.  She also states that she was trying to navigate four steps with a RW and lost her balance Restrictions Weight Bearing Restrictions: No       Mobility Bed Mobility Overal bed mobility: Modified  Independent                  Transfers Overall transfer level: Needs assistance Equipment used: None Transfers: Sit to/from Stand;Stand Pivot Transfers Sit to Stand: Supervision Stand pivot transfers: Supervision       General transfer comment: supervision for visual deficits    Balance Overall balance assessment: Needs assistance;History of Falls Sitting-balance support: No upper extremity supported;Feet supported Sitting balance-Leahy Scale: Good     Standing balance support: During functional activity Standing balance-Leahy Scale: Good Standing balance comment: able to perform ADLs in sitting and standing without LOB or UE support                           ADL either performed or assessed with clinical judgement   ADL Overall ADL's : Needs assistance/impaired Eating/Feeding: Independent   Grooming: Wash/dry hands;Wash/dry face;Oral care;Brushing hair;Supervision/safety;Standing   Upper Body Bathing: Set up;Supervision/ safety;Sitting   Lower Body Bathing: Supervison/ safety;Sit to/from stand   Upper Body Dressing : Supervision/safety;Sitting   Lower Body Dressing: Supervision/safety;Sit to/from stand   Toilet Transfer: Supervision/safety;Comfort height toilet   Toileting- Clothing Manipulation and Hygiene: Supervision/safety;Sit to/from stand       Functional mobility during ADLs: Supervision/safety;Min guard General ADL Comments: She requires supervision in a structured environment, and min guard in an unstructured enironment such as the hallway due to increased difficulty scanning her environment     Vision   Additional Comments: Lt visual field deficit   Perception     Praxis      Cognition Arousal/Alertness:  Awake/alert Behavior During Therapy: WFL for tasks assessed/performed Overall Cognitive Status: Impaired/Different from baseline Area of Impairment: Attention;Memory;Following commands;Safety/judgement;Awareness;Problem  solving                   Current Attention Level: Selective Memory: Decreased short-term memory Following Commands: Follows one step commands consistently;Follows multi-step commands inconsistently Safety/Judgement: Decreased awareness of safety Awareness: Emergent Problem Solving: Requires verbal cues;Requires tactile cues General Comments: Pt is able to sequence and problem solve through familiar tasks, and structured environments, but struggles with novel tasks and unstructured environments.  She follows one step commands consistently, but struggles to recall the instructions and frequently self distracts.  She required mod cues to perform simple path finding in the hallway.   She scored 4/28 on the Short Blessed Test demonstrating difficulty with recall.  However, functionallly, she struggles significantly with higher level tasks and executive functions        Exercises     Shoulder Instructions       General Comments Discussed recommendation for 24 hour supervision optimally, organizing and structuring her environment to remove clutter and to keep items in consistent locations; need for direct supervision with cooking, finances, and medication management.  Recommend NO driving, and to not ambulate outside without direct supervision    Pertinent Vitals/ Pain       Pain Assessment: No/denies pain  Home Living                                          Prior Functioning/Environment              Frequency  Min 2X/week        Progress Toward Goals  OT Goals(current goals can now be found in the care plan section)  Progress towards OT goals: Progressing toward goals     Plan Discharge plan remains appropriate;Discharge plan needs to be updated    Co-evaluation                 AM-PAC OT "6 Clicks" Daily Activity     Outcome Measure   Help from another person eating meals?: None Help from another person taking care of personal  grooming?: A Little Help from another person toileting, which includes using toliet, bedpan, or urinal?: A Little Help from another person bathing (including washing, rinsing, drying)?: A Little Help from another person to put on and taking off regular upper body clothing?: A Little Help from another person to put on and taking off regular lower body clothing?: A Little 6 Click Score: 19    End of Session Equipment Utilized During Treatment: Gait belt      Activity Tolerance Patient tolerated treatment well   Patient Left in bed;with call bell/phone within reach;with bed alarm set   Nurse Communication Mobility status        Time: 5366-4403 OT Time Calculation (min): 49 min  Charges: OT General Charges $OT Visit: 1 Visit OT Treatments $Self Care/Home Management : 23-37 mins $Therapeutic Activity: 8-22 mins  Nilsa Nutting., OTR/L Acute Rehabilitation Services Pager [phone] Office [phone]    Lucille Passy M 12/06/2020, 10:14 AM

## 2020-12-06 NOTE — Discharge Instructions (Signed)
Barbara French,   It was a pleasure taking care of you during this admission.  You were hospitalized for an a stroke due to the right internal carotid artery blockage.  You underwent a stent placement to the artery.  Please stop taking aspirin and start Brilinta for 30 days.  Please follow-up with neurology and vascular interventional radiology after discharge.  Please follow-up with your primary care doctor in 1 week for repeat blood work.  They will help you set up outpatient physical and occupational therapy.   Take care,  Dr. Alfonse Spruce

## 2020-12-06 NOTE — Progress Notes (Signed)
Occupational Therapy Treatment Patient Details Name: Barbara French MRN: [id number] DOB: [birth year] Today's Date: 12/06/2020    History of present illness Pt is 65 yo female who presents after falling down 4 steps and being unable to get up, L facial droop, slurred speech. MRI showed extension of R PCA infarct from 4/13/222. Pt scheduled for Rt ICA stent 4/28. PMH: Bil carotid stenosis s/p R carotid endarterectomy 12/21 and L stent placement, HTN, DM2, smoker, PE, COPD, R TKA, multiple neck and back surgeries, and narcolepsy.   OT comments  Vision and memory compensatory strategies handouts provided to and reviewed with pt.  She was able to verbalize understanding of info covered.   Follow Up Recommendations  Outpatient OT;Home health OT    Equipment Recommendations  None recommended by OT    Recommendations for Other Services      Precautions / Restrictions Precautions Precautions: Fall Precaution Comments: pt has had multiple falls down stairs at home including one PTA yesterday; however, pt reports these have been associated with the occurrence of the strokes.  She also states that she was trying to navigate four steps with a RW and lost her balance       Mobility Bed Mobility                    Transfers                      Balance                                           ADL either performed or assessed with clinical judgement   ADL                                               Vision       Perception     Praxis      Cognition Arousal/Alertness: Awake/alert Behavior During Therapy: WFL for tasks assessed/performed Overall Cognitive Status: Impaired/Different from baseline Area of Impairment: Attention;Memory;Following commands;Safety/judgement;Awareness;Problem solving                   Current Attention Level: Selective Memory: Decreased short-term memory Following Commands: Follows one step  commands consistently;Follows multi-step commands inconsistently Safety/Judgement: Decreased awareness of safety Awareness: Emergent Problem Solving: Requires verbal cues;Requires tactile cues          Exercises Exercises: Other exercises Other Exercises Other Exercises: Pt provided with visual field deficit handout, managing environment handout for low vision, and memory strategies handout.  These were reviewed with her and she verbalized understanding.   Shoulder Instructions       General Comments      Pertinent Vitals/ Pain       Pain Assessment: No/denies pain  Home Living                                          Prior Functioning/Environment              Frequency  Min 2X/week        Progress Toward Goals  OT Goals(current goals can now be found in the care  plan section)  Progress towards OT goals: Progressing toward goals     Plan Discharge plan remains appropriate;Discharge plan needs to be updated    Co-evaluation                 AM-PAC OT "6 Clicks" Daily Activity     Outcome Measure   Help from another person eating meals?: None Help from another person taking care of personal grooming?: A Little Help from another person toileting, which includes using toliet, bedpan, or urinal?: A Little Help from another person bathing (including washing, rinsing, drying)?: A Little Help from another person to put on and taking off regular upper body clothing?: A Little Help from another person to put on and taking off regular lower body clothing?: A Little 6 Click Score: 19    End of Session Equipment Utilized During Treatment: Gait belt      Activity Tolerance Patient tolerated treatment well   Patient Left in bed;with call bell/phone within reach;with nursing/sitter in room   Nurse Communication Mobility status        Time: 1211-1219 OT Time Calculation (min): 8 min  Charges: OT General Charges $OT Visit: 1 Visit OT  Treatments $Cognitive Funtion inital: Initial 15 mins  Nilsa Nutting., OTR/L Acute Rehabilitation Services Pager [phone] Office [phone]    Lucille Passy M 12/06/2020, 1:10 PM

## 2020-12-06 NOTE — Progress Notes (Signed)
STROKE TEAM PROGRESS NOTE     INTERVAL HISTORY Patient is lying in bed comfortably.  No complaints.  Vital signs stable.  Neurological exam unchanged.  She had successful right carotid artery angioplasty stenting by Dr. Earleen Newport which went uneventfully.  Neurological exam still shows left hemianopsia and left sided sensory impairment.  Vitals:   12/06/20 0900 12/06/20 1000 12/06/20 1100 12/06/20 1200  BP: (!) 115/58 129/64 137/74 124/61  Pulse:  (!) 151 (!) 172 (!) 129  Resp: 17 14 18  (!) 21  Temp:      TempSrc:      SpO2:  (!) 87% 93% 99%  Weight:      Height:       CBC:  Recent Labs  Lab 12/02/20 1330 12/02/20 1338 12/05/20 0311 12/06/20 0946  WBC 12.5*   < > 15.7* 13.8*  NEUTROABS 9.0*  --   --   --   HGB 13.7   < > 17.0* 14.5  HCT 43.8   < > 52.6* 46.4*  MCV 87.1   < > 82.8 85.8  PLT 275   < > 383 384   < > = values in this interval not displayed.   Basic Metabolic Panel:  Recent Labs  Lab 12/04/20 0209 12/05/20 0311  NA 136 134*  K 3.8 3.9  CL 98 98  CO2 25 24  GLUCOSE 125* 125*  BUN <5* 8  CREATININE 0.47 0.50  CALCIUM 9.6 9.7   Lipid Panel: No results for input(s): CHOL, TRIG, HDL, CHOLHDL, VLDL, LDLCALC in the last 168 hours. HgbA1c: No results for input(s): HGBA1C in the last 168 hours. Urine Drug Screen:  Recent Labs  Lab 12/03/20 0002  LABOPIA POSITIVE*  COCAINSCRNUR NONE DETECTED  LABBENZ NONE DETECTED  AMPHETMU NONE DETECTED  THCU NONE DETECTED  LABBARB NONE DETECTED    Alcohol Level  Recent Labs  Lab 12/02/20 1330  ETH <10   IMAGING  NCT head  There is no acute intracranial hemorrhage or evidence of acute infarction. ASPECT score is 10. No significant change since recent prior study. Small infarcts on the prior MRI are not well seen. Subacute and chronic infarcts as seen previously.  CTA head and neck and perfusion No significant change since CTA 11/21/2020. No new large vessel occlusion. Remains critical stenosis of the  proximal right ICA with near occlusion. Perfusion imaging demonstrates no evidence of core infarction. There is calculated penumbra involving the area of subacute right PCA territory infarction as well as the right MCA watershed territory likely related to above.  MRI Acute/early subacute cortical infarction changes within the right parietal lobe (PCA vascular territory), slightly progressed from the brain MRI of 11/21/2020.  PHYSICAL EXAM Psych: Affect appropriate to situation Eyes: No scleral injection HENT: No OP obstrucion Head: Normocephalic.  Cardiovascular: Normal rate and regular rhythm.  Respiratory: Effort normal . GI: Soft.  No distension. There is no tenderness.  Skin: WDI  Neuro: Mental Status: Patient is awake, alert,. She can state age but not month. Patient is unable to give a clear and coherent history. Speech/Language:  Mild dysarthria and no aphasia. Fluency, naming, repetition, and comprehensio intact. Cranial Nerves: II: Pupils are equal, round, and reactive to light.   Dense LHH III,IV, VI: EOMI without ptosis or diploplia. V: Facial sensation is symmetric to light touch.  VII: Slight left facial droop. X: Uvula elevates symmetrically XI: Shoulder shrug is symmetric. XII: tongue is midline without atrophy or fasciculations.  Motor: Tone is normal. Bulk is  increased. 5/5 strength was present in all four extremities, although patient had difficulty following commands on L side 2/2 neglect. Sensory: Sensation is symmetric to light touch in the arms and legs. Noted extinction to left with DSS to light touch.   Plantars: Toes are downgoing bilaterally.  Cerebellar: FNF and HKS are intact bilaterally NIHSS 4.  Premorbid modified Rankin 1 ASSESSMENT/PLAN Barbara French is a 65 year old female with past medical history of prior right PCA infarct (4/13), bilateral carotid artery stenosis status post right carotid endarterectomy (07/2020) and left stent  placement, hypertension, T2DM, PE on Eliquis, COPD, left homonymous hemianopsia, narcolepsy who presented to the ED after sudden onset of left sided weakness, left facial droop and slurred speech. MRI shows right parietal lobe acute and subacute cortical infarctions along with a few subcentimeter acute/early subacute infarcts within the right frontoparietal white matter. CTA h/n re-demonstrates known right critical ICA stenosis.   Acute and early subacute Right PCA stroke in setting of proximal right ICA critical stenosis    2D Echo from recent admission at [facility] shows EF 60-65%, no shunt, no clot ,   Passed swallow eval for heart healthy diet   On ASA 81mg  and Eliquis prior to admission  Loaded with Brilinta, On ASA 81mg  daily and will begin Brilinta 90 mg BID tomorrow  Eliquis on hold per Dr. Earleen Newport in preprocedure setting  Therapy recommendations:  None   Disposition:  TBD  Right ICA stenosis  Plan for image-guided cerebral arteriogram with possible revascularization (angioplasty/stent placement) of right ICA stenosis in IR tentatively for Thursday 12/05/2020 with Dr. Earleen Newport and anesthesia   Hx of DVT  Hold Eliquis pre-procedure  Hypertension . Permissive hypertension (OK if < 220/120) but gradually normalize in 5-7 days . Long-term BP goal normotensive  Hyperlipidemia  Home meds: High intensity Lipitor 40mg  on board   LDL from 4/13 showing at 40 which is at goal < 70  Continue statin at discharge  Diabetes type II Controlled   HgbA1c 6.7 at goal 7, on 4/13  CBGs Recent Labs    12/05/20 2148 12/06/20 0720 12/06/20 1107  GLUCAP 244* 125* 190*      SSI  Management per primary team  Other Stroke Risk Factors  Current Cigarette smoker: 24 pack year history, counseled to quit.   Current ETOH use, alcohol level <10, advised to drink no more than 1 drink(s) a day  Obesity, Body mass index is 31.95 kg/m., BMI >/= 30 associated with increased stroke  risk, recommend weight loss, diet and exercise as appropriate   Hx stroke/TIA  Coronary artery disease  Obstructive sleep apnea in history, not on CPAP at home  Other [facility] day # 4    Continue Eliquis and Brilinta for 1 month and then Eliquis alone as per discussion with Dr. Earleen Newport.  Maintain strict control of blood pressure with systolic goal [id number] for the first 24 hours.  Mobilize out of bed.  Patient counseled to quit smoking and is agreeable.  Maintain aggressive risk factor modification.  Discussed with patient and Dr. Earleen Newport and Dr Alfonse Spruce.  Greater than 50% time during this 35-minute visit was spent on counseling and coordination of care discussion with care team.   Antony Contras, MD Medical Director [place] Pager: [phone] 12/06/2020 1:59 PM   To contact Stroke Continuity provider, please refer to [email or web address] After hours, contact General Neurology

## 2020-12-06 NOTE — Progress Notes (Signed)
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 angiogram findings. There is a relatively straight course of the cervical ICA, with mild tortuosity at the site of the stenosis. Degree of stenosis estimated 83% by NASCET criteria. Right MCA: M1 segment patent. Mild tortuosity of the distal M1, into the M2 divisions. Insular and opercular segments patent. Unremarkable caliber and course of the cortical segments. Typical arterial, capillary/ parenchymal, and venous phase. No early venous shunting identified, particularly in the parietooccipital region. Right ACA: A 1 segment patent. A 2 segment perfuses the right territory. Right vertebral artery/posterior: Mildly tortuous right vertebral artery, Co dominant to the left. No significant atherosclerotic. Changes of the vertebral artery. There is extradural posterior-inferior cerebellar artery origin, with in early division of the PICA perfusing bilateral cerebellar hemispheres. There is also robust perfusion of the typical left PICA territory by the duplicated anterior  inferior cerebellar artery. Symmetric perfusion of the bilateral PCA. Of note there is significant filling of the right MCA branches via patent posterior communicating artery, with perfusion of anterior cerebral artery  and MCA territory of the right hemisphere given the cervical stenosis. Post-intervention: Right MCA: Post angioplasty and stenting, NASCET criteria measure 16% stenosis residual. Complete perfusion of the right MCA and ACA branches maintained. No early venous shunting. No cross fill to the left, indicating absent anterior communicating artery. After stenting there is robust filling of the ophthalmic artery which was not perceived on the pre stent images. IMPRESSION: Status post ultrasound guided access right common femoral artery for cervical and cerebral angiogram and treatment of high-grade symptomatic right cervical ICA stenosis with stent assisted angioplasty using distal cerebral embolic protection, with less than 20 percent residual. Angio-Seal deployed for hemostasis. Signed, Dulcy Fanny. Dellia Nims, RPVI [facility] Inter[facility] Radiology [facility] Radiology Electronically Signed   By: Corrie Mckusick D.O.   On: 12/05/2020 15:51   IR US Guide Vasc Access Right  Result Date: 12/05/2020 INDICATION: 65 year old female with a history of right carotid endarterectomy and a prior left-sided carotid stent for bilateral extracranial cerebrovascular disease. Within the last month she has had a right PCA clinical stroke and infarction on MR, with her prior workup revealing a right ICA recurrent stenosis at the carotid endarterectomy site, high-grade. She currently is admitted with right frontoparietal watershed infarction and stroke symptoms for the high-grade stenosis. She presents for treatment of her symptomatic, recurrent right ICA high-grade stenosis, high risk for surgery given prior endarterectomy. EXAM: ULTRASOUND-GUIDED ACCESS RIGHT COMMON FEMORAL ARTERY CEREBRAL AND CERVICAL  ANGIOGRAM RIGHT ICA ANGIOPLASTY AND STENT WITH DISTAL CEREBRAL EMBOLIC PROTECTION ANGIO-SEAL FOR HEMOSTASIS MEDICATIONS: 5000 UNITS IV HEPARIN. The antibiotic was administered within 1 hour of the procedure ANESTHESIA/SEDATION: The anesthesia team was present to provide general endotracheal tube anesthesia and for patient monitoring during the procedure. Intubation was performed in neuro IR room. Left radial arterial line was performed by the anesthesia team. Interventional neuro radiology nursing staff was also present. FLUOROSCOPY TIME:  Fluoroscopy Time: 11 minutes 54 seconds (1,224 mGy). COMPLICATIONS: None TECHNIQUE: Informed written consent was obtained from the patient and the patient's family after a thorough discussion of the procedural risks, benefits and alternatives. Specific risks discussed include: Bleeding, infection, contrast reaction, kidney injury/failure, need for further procedure/surgery, arterial injury or dissection, approximately 6% risk of intra procedural stroke, post perfusion syndrome/neurologic deterioration, cardiopulmonary collapse, death. All questions were addressed. Maximal Sterile Barrier Technique was utilized including during the procedure including caps, mask, sterile gowns, sterile gloves, sterile drape, hand hygiene and skin antiseptic. A timeout was performed prior to the initiation of the procedure. The anesthesia team was present to provide general endotracheal tube anesthesia and for patient monitoring during the procedure. Interventional neuro radiology nursing staff was also present. PROCEDURE: After the anesthesia team initiated general anesthesia, a time-out was performed. The patient is prepped and draped in the usual sterile fashion, room 2 in the neuro IR suite. Ultrasound survey of the right inguinal region was performed with images stored and sent to PACs. 11 blade scalpel was used to make a small incision. Blunt dissection was performed with ultrasound guidance.  A micropuncture needle was used access the right common femoral artery under ultrasound. With excellent arterial blood flow returned, an .018 micro wire was passed through the needle, observed to enter the abdominal aorta under fluoroscopy. The needle was removed, and a micropuncture sheath was placed over the wire. The inner dilator and wire were removed, and an 035 Bentson wire was advanced under fluoroscopy into the abdominal aorta. The sheath was removed and a standard 5 [place] vascular sheath  was placed. The dilator was removed and the sheath was flushed. A 38F JB-1 diagnostic catheter was advanced over the wire to the proximal descending thoracic aorta. Wire was then removed. Double flush of the catheter was performed. We then proceeded with a four-vessel cerebral angiogram. Images were reviewed. We then proceeded with our treatment plan. 5000 units of IV heparin was administered. The JB 1 catheter was used to select the innominate artery, directed then into the right common carotid artery. With an adequate catheter position in the common carotid artery, roadmap angiogram was performed. The catheter was then navigated with the assistance of a Glidewire into the external carotid artery branches. Wire was removed and a rosen wire was placed. Catheter was removed on the Rosen wire. The 5 French sheath was then removed and exchanged for 8 French 55 centimeter BrightTip sheath. Sheath was flushed and attached to pressurized and heparinized saline bag for constant forward flow. Then an 8 [place], 85 cm Flowgate balloon tip catheter was prepared on the back table with inflation of the balloon with 50/50 concentration of dilute contrast. The balloon catheter was then advanced over the wire, positioned into the distal right common carotid artery. Copious back flush was performed and the balloon catheter was attached to heparinized and pressurized saline bag for forward flow. Angiogram was then performed. After review of  the images and measurements, we elected to proceed with distal embolic protection using Nav 6 device, 4.43m size. We selected in 8-6 x 40 mm stent, with a plan for 4 mm x 30 mm balloon angioplasty with a Viatrac balloon. These devices were prepped on the back table, including the retrieval device of the Nav 6. Confirmation angiogram was performed for road map guide. The bare wire was advanced through the stenotic region of the ICA, into a distal position within the cervical ICA. The Nav-6 device was then advanced into a safe location above the carotid lesion with deployment of the Nav 6. Timer was started, and a gentle infusion of contrast confirmed forward flow. The deployment device was removed on the bare wire and was disposed. 4 mm balloon angioplasty was then performed. Nine atmospheres was observed. The patient remained hemodynamically stable throughout this maneuver. Balloon was removed and disposed. The 8-6 x 40 mm Xact stent was then deployed at the lesion with the deployment device removed from the bare wire and disposed. Angiogram was performed. The retrieval device was then passed over the bare wire and the Nav 6 embolic protection device was retrieved. The entire unit was removed from the balloon guide catheter under fluoroscopic observation as this traversed the new stent. We stopped the clock, 10 minutes and 40 seconds of embolic protection open time. Angiogram was performed. We then observed 5 minutes interval and a final angiogram was performed. The 55 centimeter 8 French sheath was exchanged for standard 7 French sheath at the common femoral artery puncture site. This maneuver was performed because of the lack of availability of an 8 [place]io-Seal, with only 6 [place]io-Seal in stock. We observed 2 minutes interval time and then the 6 French Angio-Seal was deployed. Patient tolerated the procedure well and remained hemodynamically stable throughout. The patient was extubated. No  complications were encountered and no significant blood loss encountered. .[name] KitchenFINDINGS: Left Findings: Left common carotid artery:  Normal course caliber and contour. Left external carotid artery: Patent with antegrade flow. Left internal carotid artery: Patent cervical ICA stent without evidence of high-grade recurrent stenosis/in stent stenosis. Normal course  caliber and contour of the cervical ICA to the skull base. Left MCA: M1 segment patent. M1 is a short M1 within early superior division. Insular and opercular segments patent. Unremarkable caliber and course of the cortical segments. Typical arterial, capillary/ parenchymal, and venous phase. Of note, there is relative absence cross over fill from the left ICA to the right ICA territory indicating absence of an anterior communicating artery. Left ACA: A 1 segment patent. A 2 segment perfuses only the left territory. Left vertebral artery/posterior: Atherosclerotic changes at the origin of the left vertebral artery. Estimated 60%-70% stenosis at the origin. Left vertebral artery this code dominant, when compared to the right. There is a prominent ascending cervical branch from the thyrocervical trunk, which has end anastomosis with the posterior occipital artery. There is also a C1 branch into the same anastomotic network of the posterior cerebral artery. Patent posteroinferior cerebellar artery. No intracranial stenosis of the distal vertebral artery. There is symmetric filling of the bilateral posterior cerebral artery at the basilar artery terminus. Relatively symmetric filling of the bilateral PCA. Bilateral superior cerebellar arteries are patent. There appear to be bilateral duplicated anterior inferior cerebellar arteries. No evidence of stenosis of the basilar. Of note, there is collateral filling of the right hemisphere from a patent right posterior communicating artery, with filling of the anterior cerebral artery territory and the MCA territory as  developing collateral flow given the cervical stenosis. Right Findings: Pre-intervention: Right common carotid artery:  Normal course caliber and contour. Right external carotid artery: Patent with antegrade flow. Right internal carotid artery: Short segment high-grade stenosis just after the bifurcation, compatible with prior CT angiogram findings. There is a relatively straight course of the cervical ICA, with mild tortuosity at the site of the stenosis. Degree of stenosis estimated 83% by NASCET criteria. Right MCA: M1 segment patent. Mild tortuosity of the distal M1, into the M2 divisions. Insular and opercular segments patent. Unremarkable caliber and course of the cortical segments. Typical arterial, capillary/ parenchymal, and venous phase. No early venous shunting identified, particularly in the parietooccipital region. Right ACA: A 1 segment patent. A 2 segment perfuses the right territory. Right vertebral artery/posterior: Mildly tortuous right vertebral artery, Co dominant to the left. No significant atherosclerotic. Changes of the vertebral artery. There is extradural posterior-inferior cerebellar artery origin, with in early division of the PICA perfusing bilateral cerebellar hemispheres. There is also robust perfusion of the typical left PICA territory by the duplicated anterior inferior cerebellar artery. Symmetric perfusion of the bilateral PCA. Of note there is significant filling of the right MCA branches via patent posterior communicating artery, with perfusion of anterior cerebral artery and MCA territory of the right hemisphere given the cervical stenosis. Post-intervention: Right MCA: Post angioplasty and stenting, NASCET criteria measure 16% stenosis residual. Complete perfusion of the right MCA and ACA branches maintained. No early venous shunting. No cross fill to the left, indicating absent anterior communicating artery. After stenting there is robust filling of the ophthalmic artery which  was not perceived on the pre stent images. IMPRESSION: Status post ultrasound guided access right common femoral artery for cervical and cerebral angiogram and treatment of high-grade symptomatic right cervical ICA stenosis with stent assisted angioplasty using distal cerebral embolic protection, with less than 20 percent residual. Angio-Seal deployed for hemostasis. Signed, Dulcy Fanny. Dellia Nims, RPVI Vascular and Interventional Radiology Specialists [facility] Radiology Electronically Signed   By: Corrie Mckusick D.O.   On: 12/05/2020 15:51   CT CEREBRAL PERFUSION W CONTRAST  Result Date: 12/02/2020 CLINICAL DATA:  Left-sided weakness EXAM: CT ANGIOGRAPHY HEAD AND NECK CT PERFUSION BRAIN TECHNIQUE: Multidetector CT imaging of the head and neck was performed using the standard protocol during bolus administration of intravenous contrast. Multiplanar CT image reconstructions and MIPs were obtained to evaluate the vascular anatomy. Carotid stenosis measurements (when applicable) are obtained utilizing NASCET criteria, using the distal internal carotid diameter as the denominator. Multiphase CT imaging of the brain was performed following IV bolus contrast injection. Subsequent parametric perfusion maps were calculated using RAPID software. CONTRAST:  164m OMNIPAQUE IOHEXOL 350 MG/ML SOLN COMPARISON:  CTA 11/21/2020 FINDINGS: CTA NECK FINDINGS Aortic arch: Stable appearance with primarily calcified plaque along the arch and great vessel origins. Right carotid system: Common carotid is patent. As before, soft tissue thickening is noted about the bifurcation, which may reflect prior endarterectomy. Remains similar critical stenosis of the proximal ICA with near occlusion. Left carotid system: Patent. As before, there is a patent stent spanning distal common and proximal internal carotid arteries. Vertebral arteries: Patent. Plaque at the left vertebral origin. No new stenosis. Skeleton: Stable appearance of  degenerative and postoperative changes of the cervical spine. Degenerative changes of the left temporomandibular joint. Other neck: No new findings. Upper chest: Imaged in expiration. Right upper lobe nodule is not well evaluated. Review of the MIP images confirms the above findings CTA HEAD FINDINGS Anterior circulation: Intracranial internal carotid arteries are patent with calcified plaque causing mild stenosis. Anterior and middle cerebral arteries are patent. Posterior circulation: Intracranial vertebral arteries are patent with calcified plaque causing mild stenosis on the left. Basilar artery is patent. Major cerebellar artery origins are patent. Stable severe stenosis of the right P2 PCA. Left posterior cerebral artery is patent. Venous sinuses: As permitted by contrast timing, patent. Review of the MIP images confirms the above findings CT Brain Perfusion Findings: CBF (<30%) Volume: 065mPerfusion (Tmax>6.0s) volume: 8457mismatch Volume: 83m68mfarction Location:Number is located within the area of right PCA infarction with additional involvement of right MCA watershed territory. IMPRESSION: No significant change since CTA 11/21/2020. No new large vessel occlusion. Remains critical stenosis of the proximal right ICA with near occlusion. Perfusion imaging demonstrates no evidence of core infarction. There is calculated penumbra involving the area of subacute right PCA territory infarction as well as the right MCA watershed territory likely related to above. Electronically Signed   By: PranMacy Mis.   On: 12/02/2020 14:21   DG Chest Portable 1 View  Result Date: 12/02/2020 CLINICAL DATA:  Patient for surgery.  Carotid artery disease. EXAM: PORTABLE CHEST 1 VIEW COMPARISON:  Single-view of the chest 11/20/2020. FINDINGS: There is cardiomegaly and mild vascular congestion. No consolidative process, pneumothorax or effusion. Aortic atherosclerosis is noted. Suture material projects over the lower left  chest. No acute or focal bony abnormality. IMPRESSION: Cardiomegaly and mild vascular congestion. Electronically Signed   By: ThomInge Rise.   On: 12/02/2020 14:53   CT HEAD CODE STROKE WO CONTRAST  Result Date: 12/02/2020 CLINICAL DATA:  Code stroke.  Left facial droop and weakness EXAM: CT HEAD WITHOUT CONTRAST TECHNIQUE: Contiguous axial images were obtained from the base of the skull through the vertex without intravenous contrast. COMPARISON:  11/21/2020 FINDINGS: Brain: No acute intracranial hemorrhage, mass effect, or edema. No new loss of gray-white differentiation. Small right cerebral infarcts on recent prior MRI are not well seen. Subacute infarct of the right PCA territory and chronic infarct of the left parietal lobe. Additional patchy hypoattenuation  in the supratentorial white matter is nonspecific but probably reflects similar chronic microvascular ischemic changes. Ventricles and sulci are stable in size and configuration. Vascular: No hyperdense vessel. There is intracranial atherosclerotic calcification at the skull base. Skull: Unremarkable. Sinuses/Orbits: Aerated.  No acute orbital abnormality. Other: Mastoid air cells are clear. ASPECTS ([place] Stroke Program Early CT Score) - Ganglionic level infarction (caudate, lentiform nuclei, internal capsule, insula, M1-M3 cortex): 7 - Supraganglionic infarction (M4-M6 cortex): 3 Total score (0-10 with 10 being normal): 10 IMPRESSION: There is no acute intracranial hemorrhage or evidence of acute infarction. ASPECT score is 10. No significant change since recent prior study. Small infarcts on the prior MRI are not well seen. Subacute and chronic infarcts as seen previously. Initial results were communicated to Dr. Quinn Axe At 1:49 pm on 12/02/2020 by text page via the [facility] messaging system. Electronically Signed   By: Macy Mis M.D.   On: 12/02/2020 13:59   CT ANGIO HEAD CODE STROKE  Result Date: 12/02/2020 CLINICAL DATA:  Left-sided  weakness EXAM: CT ANGIOGRAPHY HEAD AND NECK CT PERFUSION BRAIN TECHNIQUE: Multidetector CT imaging of the head and neck was performed using the standard protocol during bolus administration of intravenous contrast. Multiplanar CT image reconstructions and MIPs were obtained to evaluate the vascular anatomy. Carotid stenosis measurements (when applicable) are obtained utilizing NASCET criteria, using the distal internal carotid diameter as the denominator. Multiphase CT imaging of the brain was performed following IV bolus contrast injection. Subsequent parametric perfusion maps were calculated using RAPID software. CONTRAST:  171m OMNIPAQUE IOHEXOL 350 MG/ML SOLN COMPARISON:  CTA 11/21/2020 FINDINGS: CTA NECK FINDINGS Aortic arch: Stable appearance with primarily calcified plaque along the arch and great vessel origins. Right carotid system: Common carotid is patent. As before, soft tissue thickening is noted about the bifurcation, which may reflect prior endarterectomy. Remains similar critical stenosis of the proximal ICA with near occlusion. Left carotid system: Patent. As before, there is a patent stent spanning distal common and proximal internal carotid arteries. Vertebral arteries: Patent. Plaque at the left vertebral origin. No new stenosis. Skeleton: Stable appearance of degenerative and postoperative changes of the cervical spine. Degenerative changes of the left temporomandibular joint. Other neck: No new findings. Upper chest: Imaged in expiration. Right upper lobe nodule is not well evaluated. Review of the MIP images confirms the above findings CTA HEAD FINDINGS Anterior circulation: Intracranial internal carotid arteries are patent with calcified plaque causing mild stenosis. Anterior and middle cerebral arteries are patent. Posterior circulation: Intracranial vertebral arteries are patent with calcified plaque causing mild stenosis on the left. Basilar artery is patent. Major cerebellar artery  origins are patent. Stable severe stenosis of the right P2 PCA. Left posterior cerebral artery is patent. Venous sinuses: As permitted by contrast timing, patent. Review of the MIP images confirms the above findings CT Brain Perfusion Findings: CBF (<30%) Volume: 06mPerfusion (Tmax>6.0s) volume: 8467mismatch Volume: 92m51mfarction Location:Number is located within the area of right PCA infarction with additional involvement of right MCA watershed territory. IMPRESSION: No significant change since CTA 11/21/2020. No new large vessel occlusion. Remains critical stenosis of the proximal right ICA with near occlusion. Perfusion imaging demonstrates no evidence of core infarction. There is calculated penumbra involving the area of subacute right PCA territory infarction as well as the right MCA watershed territory likely related to above. Electronically Signed   By: PranMacy Mis.   On: 12/02/2020 14:21   CT ANGIO NECK CODE STROKE  Result Date: 12/02/2020 CLINICAL  DATA:  Left-sided weakness EXAM: CT ANGIOGRAPHY HEAD AND NECK CT PERFUSION BRAIN TECHNIQUE: Multidetector CT imaging of the head and neck was performed using the standard protocol during bolus administration of intravenous contrast. Multiplanar CT image reconstructions and MIPs were obtained to evaluate the vascular anatomy. Carotid stenosis measurements (when applicable) are obtained utilizing NASCET criteria, using the distal internal carotid diameter as the denominator. Multiphase CT imaging of the brain was performed following IV bolus contrast injection. Subsequent parametric perfusion maps were calculated using RAPID software. CONTRAST:  147m OMNIPAQUE IOHEXOL 350 MG/ML SOLN COMPARISON:  CTA 11/21/2020 FINDINGS: CTA NECK FINDINGS Aortic arch: Stable appearance with primarily calcified plaque along the arch and great vessel origins. Right carotid system: Common carotid is patent. As before, soft tissue thickening is noted about the  bifurcation, which may reflect prior endarterectomy. Remains similar critical stenosis of the proximal ICA with near occlusion. Left carotid system: Patent. As before, there is a patent stent spanning distal common and proximal internal carotid arteries. Vertebral arteries: Patent. Plaque at the left vertebral origin. No new stenosis. Skeleton: Stable appearance of degenerative and postoperative changes of the cervical spine. Degenerative changes of the left temporomandibular joint. Other neck: No new findings. Upper chest: Imaged in expiration. Right upper lobe nodule is not well evaluated. Review of the MIP images confirms the above findings CTA HEAD FINDINGS Anterior circulation: Intracranial internal carotid arteries are patent with calcified plaque causing mild stenosis. Anterior and middle cerebral arteries are patent. Posterior circulation: Intracranial vertebral arteries are patent with calcified plaque causing mild stenosis on the left. Basilar artery is patent. Major cerebellar artery origins are patent. Stable severe stenosis of the right P2 PCA. Left posterior cerebral artery is patent. Venous sinuses: As permitted by contrast timing, patent. Review of the MIP images confirms the above findings CT Brain Perfusion Findings: CBF (<30%) Volume: 02mPerfusion (Tmax>6.0s) volume: 8452mismatch Volume: 79m64mfarction Location:Number is located within the area of right PCA infarction with additional involvement of right MCA watershed territory. IMPRESSION: No significant change since CTA 11/21/2020. No new large vessel occlusion. Remains critical stenosis of the proximal right ICA with near occlusion. Perfusion imaging demonstrates no evidence of core infarction. There is calculated penumbra involving the area of subacute right PCA territory infarction as well as the right MCA watershed territory likely related to above. Electronically Signed   By: PranMacy Mis.   On: 12/02/2020 14:21   IR ANGIO INTRA  EXTRACRAN SEL COM CAROTID INNOMINATE UNI R MOD SED  Result Date: 12/05/2020 INDICATION: 65 y24r old female with a history of right carotid endarterectomy and a prior left-sided carotid stent for bilateral extracranial cerebrovascular disease. Within the last month she has had a right PCA clinical stroke and infarction on MR, with her prior workup revealing a right ICA recurrent stenosis at the carotid endarterectomy site, high-grade. She currently is admitted with right frontoparietal watershed infarction and stroke symptoms for the high-grade stenosis. She presents for treatment of her symptomatic, recurrent right ICA high-grade stenosis, high risk for surgery given prior endarterectomy. EXAM: ULTRASOUND-GUIDED ACCESS RIGHT COMMON FEMORAL ARTERY CEREBRAL AND CERVICAL ANGIOGRAM RIGHT ICA ANGIOPLASTY AND STENT WITH DISTAL CEREBRAL EMBOLIC PROTECTION ANGIO-SEAL FOR HEMOSTASIS MEDICATIONS: 5000 UNITS IV HEPARIN. The antibiotic was administered within 1 hour of the procedure ANESTHESIA/SEDATION: The anesthesia team was present to provide general endotracheal tube anesthesia and for patient monitoring during the procedure. Intubation was performed in neuro IR room. Left radial arterial line was performed by the anesthesia team. Interventional neuro radiology  nursing staff was also present. FLUOROSCOPY TIME:  Fluoroscopy Time: 11 minutes 54 seconds (1,224 mGy). COMPLICATIONS: None TECHNIQUE: Informed written consent was obtained from the patient and the patient's family after a thorough discussion of the procedural risks, benefits and alternatives. Specific risks discussed include: Bleeding, infection, contrast reaction, kidney injury/failure, need for further procedure/surgery, arterial injury or dissection, approximately 6% risk of intra procedural stroke, post perfusion syndrome/neurologic deterioration, cardiopulmonary collapse, death. All questions were addressed. Maximal Sterile Barrier Technique was utilized  including during the procedure including caps, mask, sterile gowns, sterile gloves, sterile drape, hand hygiene and skin antiseptic. A timeout was performed prior to the initiation of the procedure. The anesthesia team was present to provide general endotracheal tube anesthesia and for patient monitoring during the procedure. Interventional neuro radiology nursing staff was also present. PROCEDURE: After the anesthesia team initiated general anesthesia, a time-out was performed. The patient is prepped and draped in the usual sterile fashion, room 2 in the neuro IR suite. Ultrasound survey of the right inguinal region was performed with images stored and sent to PACs. 11 blade scalpel was used to make a small incision. Blunt dissection was performed with ultrasound guidance. A micropuncture needle was used access the right common femoral artery under ultrasound. With excellent arterial blood flow returned, an .018 micro wire was passed through the needle, observed to enter the abdominal aorta under fluoroscopy. The needle was removed, and a micropuncture sheath was placed over the wire. The inner dilator and wire were removed, and an 035 Bentson wire was advanced under fluoroscopy into the abdominal aorta. The sheath was removed and a standard 5 [place] vascular sheath was placed. The dilator was removed and the sheath was flushed. A 83F JB-1 diagnostic catheter was advanced over the wire to the proximal descending thoracic aorta. Wire was then removed. Double flush of the catheter was performed. We then proceeded with a four-vessel cerebral angiogram. Images were reviewed. We then proceeded with our treatment plan. 5000 units of IV heparin was administered. The JB 1 catheter was used to select the innominate artery, directed then into the right common carotid artery. With an adequate catheter position in the common carotid artery, roadmap angiogram was performed. The catheter was then navigated with the assistance of  a Glidewire into the external carotid artery branches. Wire was removed and a rosen wire was placed. Catheter was removed on the Rosen wire. The 5 French sheath was then removed and exchanged for 8 French 55 centimeter BrightTip sheath. Sheath was flushed and attached to pressurized and heparinized saline bag for constant forward flow. Then an 8 [place], 85 cm Flowgate balloon tip catheter was prepared on the back table with inflation of the balloon with 50/50 concentration of dilute contrast. The balloon catheter was then advanced over the wire, positioned into the distal right common carotid artery. Copious back flush was performed and the balloon catheter was attached to heparinized and pressurized saline bag for forward flow. Angiogram was then performed. After review of the images and measurements, we elected to proceed with distal embolic protection using Nav 6 device, 4.34m size. We selected in 8-6 x 40 mm stent, with a plan for 4 mm x 30 mm balloon angioplasty with a Viatrac balloon. These devices were prepped on the back table, including the retrieval device of the Nav 6. Confirmation angiogram was performed for road map guide. The bare wire was advanced through the stenotic region of the ICA, into a distal position within the cervical ICA.  The Nav-6 device was then advanced into a safe location above the carotid lesion with deployment of the Nav 6. Timer was started, and a gentle infusion of contrast confirmed forward flow. The deployment device was removed on the bare wire and was disposed. 4 mm balloon angioplasty was then performed. Nine atmospheres was observed. The patient remained hemodynamically stable throughout this maneuver. Balloon was removed and disposed. The 8-6 x 40 mm Xact stent was then deployed at the lesion with the deployment device removed from the bare wire and disposed. Angiogram was performed. The retrieval device was then passed over the bare wire and the Nav 6 embolic protection  device was retrieved. The entire unit was removed from the balloon guide catheter under fluoroscopic observation as this traversed the new stent. We stopped the clock, 10 minutes and 40 seconds of embolic protection open time. Angiogram was performed. We then observed 5 minutes interval and a final angiogram was performed. The 55 centimeter 8 French sheath was exchanged for standard 7 French sheath at the common femoral artery puncture site. This maneuver was performed because of the lack of availability of an 8 [place] Angio-Seal, with only 6 [place] Angio-Seal in stock. We observed 2 minutes interval time and then the 6 [place] Angio-Seal was deployed. Patient tolerated the procedure well and remained hemodynamically stable throughout. The patient was extubated. No complications were encountered and no significant blood loss encountered. [name] Kitchen FINDINGS: Left Findings: Left common carotid artery:  Normal course caliber and contour. Left external carotid artery: Patent with antegrade flow. Left internal carotid artery: Patent cervical ICA stent without evidence of high-grade recurrent stenosis/in stent stenosis. Normal course caliber and contour of the cervical ICA to the skull base. Left MCA: M1 segment patent. M1 is a short M1 within early superior division. Insular and opercular segments patent. Unremarkable caliber and course of the cortical segments. Typical arterial, capillary/ parenchymal, and venous phase. Of note, there is relative absence cross over fill from the left ICA to the right ICA territory indicating absence of an anterior communicating artery. Left ACA: A 1 segment patent. A 2 segment perfuses only the left territory. Left vertebral artery/posterior: Atherosclerotic changes at the origin of the left vertebral artery. Estimated 60%-70% stenosis at the origin. Left vertebral artery this code dominant, when compared to the right. There is a prominent ascending cervical branch from the thyrocervical trunk,  which has end anastomosis with the posterior occipital artery. There is also a C1 branch into the same anastomotic network of the posterior cerebral artery. Patent posteroinferior cerebellar artery. No intracranial stenosis of the distal vertebral artery. There is symmetric filling of the bilateral posterior cerebral artery at the basilar artery terminus. Relatively symmetric filling of the bilateral PCA. Bilateral superior cerebellar arteries are patent. There appear to be bilateral duplicated anterior inferior cerebellar arteries. No evidence of stenosis of the basilar. Of note, there is collateral filling of the right hemisphere from a patent right posterior communicating artery, with filling of the anterior cerebral artery territory and the MCA territory as developing collateral flow given the cervical stenosis. Right Findings: Pre-intervention: Right common carotid artery:  Normal course caliber and contour. Right external carotid artery: Patent with antegrade flow. Right internal carotid artery: Short segment high-grade stenosis just after the bifurcation, compatible with prior CT angiogram findings. There is a relatively straight course of the cervical ICA, with mild tortuosity at the site of the stenosis. Degree of stenosis estimated 83% by NASCET criteria. Right MCA: M1 segment patent. Mild  tortuosity of the distal M1, into the M2 divisions. Insular and opercular segments patent. Unremarkable caliber and course of the cortical segments. Typical arterial, capillary/ parenchymal, and venous phase. No early venous shunting identified, particularly in the parietooccipital region. Right ACA: A 1 segment patent. A 2 segment perfuses the right territory. Right vertebral artery/posterior: Mildly tortuous right vertebral artery, Co dominant to the left. No significant atherosclerotic. Changes of the vertebral artery. There is extradural posterior-inferior cerebellar artery origin, with in early division of the PICA  perfusing bilateral cerebellar hemispheres. There is also robust perfusion of the typical left PICA territory by the duplicated anterior inferior cerebellar artery. Symmetric perfusion of the bilateral PCA. Of note there is significant filling of the right MCA branches via patent posterior communicating artery, with perfusion of anterior cerebral artery and MCA territory of the right hemisphere given the cervical stenosis. Post-intervention: Right MCA: Post angioplasty and stenting, NASCET criteria measure 16% stenosis residual. Complete perfusion of the right MCA and ACA branches maintained. No early venous shunting. No cross fill to the left, indicating absent anterior communicating artery. After stenting there is robust filling of the ophthalmic artery which was not perceived on the pre stent images. IMPRESSION: Status post ultrasound guided access right common femoral artery for cervical and cerebral angiogram and treatment of high-grade symptomatic right cervical ICA stenosis with stent assisted angioplasty using distal cerebral embolic protection, with less than 20 percent residual. Angio-Seal deployed for hemostasis. Signed, Dulcy Fanny. Dellia Nims, RPVI [facility] Radiology Electronically Signed   By: Corrie Mckusick D.O.   On: 12/05/2020 15:51   IR ANGIO VERTEBRAL SEL VERTEBRAL BILAT MOD SED  Result Date: 12/05/2020 INDICATION: 65 year old female with a history of right carotid endarterectomy and a prior left-sided carotid stent for bilateral extracranial cerebrovascular disease. Within the last month she has had a right PCA clinical stroke and infarction on MR, with her prior workup revealing a right ICA recurrent stenosis at the carotid endarterectomy site, high-grade. She currently is admitted with right frontoparietal watershed infarction and stroke symptoms for the high-grade stenosis. She presents for treatment of her symptomatic, recurrent right ICA  high-grade stenosis, high risk for surgery given prior endarterectomy. EXAM: ULTRASOUND-GUIDED ACCESS RIGHT COMMON FEMORAL ARTERY CEREBRAL AND CERVICAL ANGIOGRAM RIGHT ICA ANGIOPLASTY AND STENT WITH DISTAL CEREBRAL EMBOLIC PROTECTION ANGIO-SEAL FOR HEMOSTASIS MEDICATIONS: 5000 UNITS IV HEPARIN. The antibiotic was administered within 1 hour of the procedure ANESTHESIA/SEDATION: The anesthesia team was present to provide general endotracheal tube anesthesia and for patient monitoring during the procedure. Intubation was performed in neuro IR room. Left radial arterial line was performed by the anesthesia team. Interventional neuro radiology nursing staff was also present. FLUOROSCOPY TIME:  Fluoroscopy Time: 11 minutes 54 seconds (1,224 mGy). COMPLICATIONS: None TECHNIQUE: Informed written consent was obtained from the patient and the patient's family after a thorough discussion of the procedural risks, benefits and alternatives. Specific risks discussed include: Bleeding, infection, contrast reaction, kidney injury/failure, need for further procedure/surgery, arterial injury or dissection, approximately 6% risk of intra procedural stroke, post perfusion syndrome/neurologic deterioration, cardiopulmonary collapse, death. All questions were addressed. Maximal Sterile Barrier Technique was utilized including during the procedure including caps, mask, sterile gowns, sterile gloves, sterile drape, hand hygiene and skin antiseptic. A timeout was performed prior to the initiation of the procedure. The anesthesia team was present to provide general endotracheal tube anesthesia and for patient monitoring during the procedure. Interventional neuro radiology nursing staff was also present. PROCEDURE: After the anesthesia  team initiated general anesthesia, a time-out was performed. The patient is prepped and draped in the usual sterile fashion, room 2 in the neuro IR suite. Ultrasound survey of the right inguinal region was  performed with images stored and sent to PACs. 11 blade scalpel was used to make a small incision. Blunt dissection was performed with ultrasound guidance. A micropuncture needle was used access the right common femoral artery under ultrasound. With excellent arterial blood flow returned, an .018 micro wire was passed through the needle, observed to enter the abdominal aorta under fluoroscopy. The needle was removed, and a micropuncture sheath was placed over the wire. The inner dilator and wire were removed, and an 035 Bentson wire was advanced under fluoroscopy into the abdominal aorta. The sheath was removed and a standard 5 [place] vascular sheath was placed. The dilator was removed and the sheath was flushed. A 81F JB-1 diagnostic catheter was advanced over the wire to the proximal descending thoracic aorta. Wire was then removed. Double flush of the catheter was performed. We then proceeded with a four-vessel cerebral angiogram. Images were reviewed. We then proceeded with our treatment plan. 5000 units of IV heparin was administered. The JB 1 catheter was used to select the innominate artery, directed then into the right common carotid artery. With an adequate catheter position in the common carotid artery, roadmap angiogram was performed. The catheter was then navigated with the assistance of a Glidewire into the external carotid artery branches. Wire was removed and a rosen wire was placed. Catheter was removed on the Rosen wire. The 5 French sheath was then removed and exchanged for 8 French 55 centimeter BrightTip sheath. Sheath was flushed and attached to pressurized and heparinized saline bag for constant forward flow. Then an 8 [place], 85 cm Flowgate balloon tip catheter was prepared on the back table with inflation of the balloon with 50/50 concentration of dilute contrast. The balloon catheter was then advanced over the wire, positioned into the distal right common carotid artery. Copious back flush  was performed and the balloon catheter was attached to heparinized and pressurized saline bag for forward flow. Angiogram was then performed. After review of the images and measurements, we elected to proceed with distal embolic protection using Nav 6 device, 4.15m size. We selected in 8-6 x 40 mm stent, with a plan for 4 mm x 30 mm balloon angioplasty with a Viatrac balloon. These devices were prepped on the back table, including the retrieval device of the Nav 6. Confirmation angiogram was performed for road map guide. The bare wire was advanced through the stenotic region of the ICA, into a distal position within the cervical ICA. The Nav-6 device was then advanced into a safe location above the carotid lesion with deployment of the Nav 6. Timer was started, and a gentle infusion of contrast confirmed forward flow. The deployment device was removed on the bare wire and was disposed. 4 mm balloon angioplasty was then performed. Nine atmospheres was observed. The patient remained hemodynamically stable throughout this maneuver. Balloon was removed and disposed. The 8-6 x 40 mm Xact stent was then deployed at the lesion with the deployment device removed from the bare wire and disposed. Angiogram was performed. The retrieval device was then passed over the bare wire and the Nav 6 embolic protection device was retrieved. The entire unit was removed from the balloon guide catheter under fluoroscopic observation as this traversed the new stent. We stopped the clock, 10 minutes and 40 seconds of  embolic protection open time. Angiogram was performed. We then observed 5 minutes interval and a final angiogram was performed. The 55 centimeter 8 French sheath was exchanged for standard 7 French sheath at the common femoral artery puncture site. This maneuver was performed because of the lack of availability of an 8 [place] Angio-Seal, with only 6 [place] Angio-Seal in stock. We observed 2 minutes interval time and then the 6  [place] Angio-Seal was deployed. Patient tolerated the procedure well and remained hemodynamically stable throughout. The patient was extubated. No complications were encountered and no significant blood loss encountered. [name] Kitchen FINDINGS: Left Findings: Left common carotid artery:  Normal course caliber and contour. Left external carotid artery: Patent with antegrade flow. Left internal carotid artery: Patent cervical ICA stent without evidence of high-grade recurrent stenosis/in stent stenosis. Normal course caliber and contour of the cervical ICA to the skull base. Left MCA: M1 segment patent. M1 is a short M1 within early superior division. Insular and opercular segments patent. Unremarkable caliber and course of the cortical segments. Typical arterial, capillary/ parenchymal, and venous phase. Of note, there is relative absence cross over fill from the left ICA to the right ICA territory indicating absence of an anterior communicating artery. Left ACA: A 1 segment patent. A 2 segment perfuses only the left territory. Left vertebral artery/posterior: Atherosclerotic changes at the origin of the left vertebral artery. Estimated 60%-70% stenosis at the origin. Left vertebral artery this code dominant, when compared to the right. There is a prominent ascending cervical branch from the thyrocervical trunk, which has end anastomosis with the posterior occipital artery. There is also a C1 branch into the same anastomotic network of the posterior cerebral artery. Patent posteroinferior cerebellar artery. No intracranial stenosis of the distal vertebral artery. There is symmetric filling of the bilateral posterior cerebral artery at the basilar artery terminus. Relatively symmetric filling of the bilateral PCA. Bilateral superior cerebellar arteries are patent. There appear to be bilateral duplicated anterior inferior cerebellar arteries. No evidence of stenosis of the basilar. Of note, there is collateral filling of the right  hemisphere from a patent right posterior communicating artery, with filling of the anterior cerebral artery territory and the MCA territory as developing collateral flow given the cervical stenosis. Right Findings: Pre-intervention: Right common carotid artery:  Normal course caliber and contour. Right external carotid artery: Patent with antegrade flow. Right internal carotid artery: Short segment high-grade stenosis just after the bifurcation, compatible with prior CT angiogram findings. There is a relatively straight course of the cervical ICA, with mild tortuosity at the site of the stenosis. Degree of stenosis estimated 83% by NASCET criteria. Right MCA: M1 segment patent. Mild tortuosity of the distal M1, into the M2 divisions. Insular and opercular segments patent. Unremarkable caliber and course of the cortical segments. Typical arterial, capillary/ parenchymal, and venous phase. No early venous shunting identified, particularly in the parietooccipital region. Right ACA: A 1 segment patent. A 2 segment perfuses the right territory. Right vertebral artery/posterior: Mildly tortuous right vertebral artery, Co dominant to the left. No significant atherosclerotic. Changes of the vertebral artery. There is extradural posterior-inferior cerebellar artery origin, with in early division of the PICA perfusing bilateral cerebellar hemispheres. There is also robust perfusion of the typical left PICA territory by the duplicated anterior inferior cerebellar artery. Symmetric perfusion of the bilateral PCA. Of note there is significant filling of the right MCA branches via patent posterior communicating artery, with perfusion of anterior cerebral artery and MCA territory of the right  hemisphere given the cervical stenosis. Post-intervention: Right MCA: Post angioplasty and stenting, NASCET criteria measure 16% stenosis residual. Complete perfusion of the right MCA and ACA branches maintained. No early venous shunting. No  cross fill to the left, indicating absent anterior communicating artery. After stenting there is robust filling of the ophthalmic artery which was not perceived on the pre stent images. IMPRESSION: Status post ultrasound guided access right common femoral artery for cervical and cerebral angiogram and treatment of high-grade symptomatic right cervical ICA stenosis with stent assisted angioplasty using distal cerebral embolic protection, with less than 20 percent residual. Angio-Seal deployed for hemostasis. Signed, Dulcy Fanny. Dellia Nims, RPVI [facility] Radiology Electronically Signed   By: Corrie Mckusick D.O.   On: 12/05/2020 15:51    Labs:  CBC: Recent Labs    12/02/20 1330 12/02/20 1338 12/03/20 0257 12/04/20 0209 12/05/20 0311  WBC 12.5*  --  9.0 11.1* 15.7*  HGB 13.7 15.0 12.5 15.8* 17.0*  HCT 43.8 44.0 39.8 50.0* 52.6*  PLT 275  --  256 323 383    COAGS: Recent Labs    12/02/20 1330  INR 1.0  APTT 32    BMP: Recent Labs    12/02/20 1330 12/02/20 1338 12/03/20 0257 12/04/20 0209 12/05/20 0311  NA 131* 132* 134* 136 134*  K 6.1* 5.8* 4.6 3.8 3.9  CL 97* 99 103 98 98  CO2 26  --  _0 GLUCOSE 93 91 92 125* 125*  BUN 23 26* 13 <5* 8  CALCIUM 8.7*  --  8.2* 9.6 9.7  CREATININE 0.83 0.80 0.50 0.47 0.50  GFRNONAA >60  --  >60 >60 >60    LIVER FUNCTION TESTS: Recent Labs    12/02/20 1330  BILITOT 0.6  AST 21  ALT 11  ALKPHOS 72  PROT 6.5  ALBUMIN 2.9*    TUMOR MARKERS: No results for input(s): AFPTM, CEA, CA199, CHROMGRNA in the last 8760 hours.  Assessment and Plan:  Ms Ask is a 65 yo female POD 1, SP right ICA stent with embolic protection for symptomatic recurrent high grade ICA stenosis after prior CEA.    She has done well overnight, and is feeling well.    Given her need for anti-coagulation (eliquis formerly) we would recommend continuing single anti-platelet therapy with the eliquis.     Would recommend continuing BID brilinta x 30 days, then transition to 50m plavix once per day.  It might be reasonable to consider moving then to 874mdaily aspirin after 12 months of this therapy.    We will see her back in 4-6 weeks in the clinic, with carotid duplex.  Dr. CrMaryjean Mornould also like to follow her in the long-term, which I did let her know.    She understands that should she have any new events in the next 4-6 weeks she should seek care at an ED.    Regarding medical management, maximal medical therapy for reduction of risk factors is indicated as recommended by updated AHA guidelines1.  This includes anti-platelet medication, tight blood glucose control to a HbA1c < 7, tight blood pressure control, maximum-dose HMG-CoA reductase inhibitor, and smoking cessation.  Smoking cessation was addressed, with strategies including counseling and nicotine replacement.  She expressed her desire to attempt cessation at this time.  I also inquired about her use of marijuana, a known independent risk factor for recurrent stroke, and she denied any usage.   Plan: - She is OK  to tx from the ICU to bed status, or she could potentially be DC'd from ICU.  Our team is OK with DC when other goals met - OK to restart eliquis - Continue single-antiplatelet therapy with brilinta 38m BID, given the need for eliquis - After 30 days of brilinta, would switch to 750mplavix daily.  - We will [facility] in 4-6 weeks [facility]. WaEarleen [place]with carotid duplex - Continue Intensive Maximal medical (IMM)  therapy.  - Smoking cessation was also addressed, with strategies including counseling and nicotine replacement.  She expressed her desire to attempt cessation at this time.  I also inquired about her use of marijuana, a known independent risk factor for recurrent stroke, and she denied any usage.    Electronically Signed: JaCorrie MckusickDO 12/06/2020, 8:16 AM     I spent a total of 35 Minutes  at the the patient's bedside AND on the patient's hospital floor or unit, greater than 50% of which was counseling/coordinating care for symptomatic right ICA high grade stenosis, SP right ICA carotid stent with embolic protection

## 2020-12-06 NOTE — Discharge Summary (Signed)
Name: Barbara French MRN: [id number] DOB: 09-28-1955 65 y.o. PCP: Nicholos Johns, MD  Date of Admission: 12/02/2020  1:30 PM Date of Discharge:  Attending Physician: Sid Falcon, MD  Discharge Diagnosis: Acute/early subacute infarct of the right parietal lobe History of prior right PCA infarct Right ICA stenosis s/p stent placment Hypertension History of PE on Eliquis Type 2 diabetes Tobacco use disorder Incidental finding of a lung nodule  Discharge Medications: Allergies as of 12/06/2020      Reactions   Bee Venom Anaphylaxis   Yellow jackets-Throat closed up   Cortisone Other (See Comments), Hives, Swelling   Injection caused temporary paralysis  Shots   Peanut Oil Other (See Comments)   Allergy test showed positive never had a reaction. Keeps epipen with her Identified by allergy test, never had a reaction      Medication List    STOP taking these medications   aspirin EC 81 MG tablet   enoxaparin 80 MG/0.8ML injection Commonly known as: LOVENOX     TAKE these medications   acetaminophen 500 MG tablet Commonly known as: TYLENOL Take 1,000 mg by mouth 4 (four) times daily as needed for moderate pain or headache.   albuterol 108 (90 Base) MCG/ACT inhaler Commonly known as: VENTOLIN HFA Inhale 1-2 puffs into the lungs 4 (four) times daily as needed for wheezing or shortness of breath.   amLODipine 5 MG tablet Commonly known as: NORVASC Take 1 tablet by mouth every morning.   amoxicillin-clavulanate 875-125 MG tablet Commonly known as: AUGMENTIN Take 1 tablet by mouth 2 (two) times daily.   amphetamine-dextroamphetamine 15 MG tablet Commonly known as: ADDERALL Take 15 mg by mouth daily as needed (when needs to be alert/awake; for MD appointments).   atorvastatin 40 MG tablet Commonly known as: LIPITOR Take 40 mg by mouth every evening.   cilostazol 50 MG tablet Commonly known as: PLETAL Take 100 mg by mouth 2 (two) times daily.   cyclobenzaprine  10 MG tablet Commonly known as: FLEXERIL Take 10 mg by mouth 3 (three) times daily.   Eliquis 5 MG Tabs tablet Generic drug: apixaban Take 5 mg by mouth 2 (two) times daily.   EPINEPHrine 0.3 mg/0.3 mL Soaj injection Commonly known as: EPI-PEN Inject 0.3 mg into the muscle once as needed for anaphylaxis.   escitalopram 20 MG tablet Commonly known as: LEXAPRO Take 20 mg by mouth every morning.   gabapentin 300 MG capsule Commonly known as: NEURONTIN Take 300 mg by mouth 3 (three) times daily.   HYDROmorphone 4 MG tablet Commonly known as: DILAUDID Take 4 mg by mouth 4 (four) times daily as needed (pain).   losartan 50 MG tablet Commonly known as: COZAAR Take 50 mg by mouth every morning.   Melatonin 5 MG Chew Chew 10 mg by mouth at bedtime.   metFORMIN 500 MG tablet Commonly known as: GLUCOPHAGE Take 500 mg by mouth 2 (two) times daily with a meal.   nitroGLYCERIN 0.4 MG SL tablet Commonly known as: NITROSTAT Place 1 tablet (0.4 mg total) under the tongue every 5 (five) minutes as needed for chest pain.   pantoprazole 40 MG tablet Commonly known as: PROTONIX Take 40 mg by mouth every morning.   potassium chloride 10 MEQ tablet Commonly known as: KLOR-CON Take 10 mEq by mouth every morning.   sitaGLIPtin 100 MG tablet Commonly known as: JANUVIA Take 100 mg by mouth every morning.   ticagrelor 90 MG Tabs tablet Commonly known as: [employer]  Take 1 tablet (90 mg total) by mouth 2 (two) times daily for 28 days.       Disposition and follow-up:   Barbara French was discharged from [facility] in Stable condition.  At the hospital follow up visit please address:  1.   Acute/early subacute infarct of the right parietal lobe History of prior right PCA infarct Right ICA stenosis s/p stent placment -Follow-up with neurology outpatient  -Follow up with VIR with Dr. Earleen Newport in 4-6 weeks  -Maximize medical therapy for risk reduction: BP  normotensive, LDL < 40 and A1C < 7.  -Stop aspirin.  Start Brilinta for 30 days then transition to Plavix 75 mg daily. -Encourage smoking cessation -Patient will require outpatient PT/OT  Physical deconsitioning Family reports declining in physical functioning memories after her last CVA.  Initial labs on admission showed hydration with hyponatremia and hypokalemia. -Assessing the need for higher level of care at home -Outpatient PT/OT  History of PE History of DVT after total knee replacement.  -Reevaluate the need for Eliquis  Hypertension -Resume amlodipine and losartan  Tobacco use disorder -Encourage smoking cessation.  Lung nodule CTA on 11/20/2020 admission showed 10 mm on right upper lobe -Consider repeat imaging if appropriate  2.  Labs / imaging needed at time of follow-up: CBC, CMP  3.  Pending labs/ test needing follow-up: NA  Follow-up Appointments:  Follow-up Information    Nicholos Johns, MD Follow up in 1 week(s).   Specialty: Internal Medicine Contact information: [address] [place] [address] [phone]        Corrie Mckusick, DO Follow up in 4 week(s).   Specialties: Interventional Radiology, Radiology Contact information: [address] [place] [address] [phone]               Hospital Course: Barbara French is a 65 year old female with past medical history of prior right PCA infarct (4/13), bilateral carotid artery stenosis status post right carotid endarterectomy (07/2020) and left stent placement, hypertension, T2DM, PE on Eliquis, COPD, narcolepsy who presented to the ED after sudden onset of left sided weakness, left facial droop and slurred speech, which is new compared to her baseline.  Patient has had a right PCA infarct 3 weeks prior and found to have a critical stenosis of the right internal carotid artery. Echocardiogram showed normal EF with no right to left shunt. She has a scheduled  revascularization procedure on 12/04/2020.  Brain MRI showed acute/early subacute infarct of the right parietal lobe which is likely secondary to her stenotic right ICA.  CTA showed stable critical stenosis of the proximal right ICA.  After discussion with patient and neurology team, we decided to proceed with placing a right ICA stent.  Patient was started on single antiplatelet therapy with Brilinta for 30 days and transition to Plavix after.  Her Eliquis was resumed after the procedure.  Patient also received maximal medical therapy for risk reduction.  Patient was counseled on smoking cessation due to the risk of her stenosis artery and she agrees with quitting.  On admission she has some signs of dehydration with hyponatremia and hyperkalemia.  Her electrolytes normalized after receiving fluid.  Patient will follow up with PCP and will benefit from outpatient PT/OT for impaired balance.  Subjective  Patient is seen at bedside. She appears comfortable and in no acute distress. She denies any acute focal weakness or change in her vision. States that she will stop smoking completely,  declines nicotine patch. Patient would like to go home today.   Discharge Exam:   BP 124/61 (BP Location: Left Arm)   Pulse (!) 129   Temp 98.2 F (36.8 C) (Oral)   Resp (!) 21   Ht 5\' 1"  (1.549 m) Comment: from 11/20/2020  Wt 76.7 kg   SpO2 99%   BMI 31.95 kg/m  Discharge exam:   Physical Exam Constitutional:      General: She is not in acute distress.    Appearance: She is not ill-appearing.  Eyes:     General: No scleral icterus.       Right eye: No discharge.        Left eye: No discharge.     Conjunctiva/sclera: Conjunctivae normal.  Cardiovascular:     Rate and Rhythm: Normal rate and regular rhythm.     Heart sounds: Normal heart sounds.     Comments: No LE edema Pulmonary:     Effort: Pulmonary effort is normal. No respiratory distress.  Musculoskeletal:     Comments: Right femoral access  site appears non-infected, no hematoma or fluctuating mass palpated. +2 right pedis pulse palpated.   Neurological:     Mental Status: She is alert.  Psychiatric:        Mood and Affect: Mood normal.        Thought Content: Thought content normal.        Judgment: Judgment normal.   Pertinent Labs, Studies, and Procedures:  CBC Latest Ref Rng & Units 12/06/2020 12/05/2020 12/04/2020  WBC 4.0 - 10.5 K/uL 13.8(H) 15.7(H) 11.1(H)  Hemoglobin 12.0 - 15.0 g/dL 14.5 17.0(H) 15.8(H)  Hematocrit 36.0 - 46.0 % 46.4(H) 52.6(H) 50.0(H)  Platelets 150 - 400 K/uL 384 383 323   CMP Latest Ref Rng & Units 12/05/2020 12/04/2020 12/03/2020  Glucose 70 - 99 mg/dL 125(H) 125(H) 92  BUN 8 - 23 mg/dL 8 <5(L) 13  Creatinine 0.44 - 1.00 mg/dL 0.50 0.47 0.50  Sodium 135 - 145 mmol/L 134(L) 136 134(L)  Potassium 3.5 - 5.1 mmol/L 3.9 3.8 4.6  Chloride 98 - 111 mmol/L 98 98 103  CO2 22 - 32 mmol/L 24 25 24   Calcium 8.9 - 10.3 mg/dL 9.7 9.6 8.2(L)  Total Protein 6.5 - 8.1 g/dL - - -  Total Bilirubin 0.3 - 1.2 mg/dL - - -  Alkaline Phos 38 - 126 U/L - - -  AST 15 - 41 U/L - - -  ALT 0 - 44 U/L - - -   MR BRAIN WO CONTRAST  Result Date: 12/02/2020 CLINICAL DATA:  Neuro deficit, acute, stroke suspected; right PCA stroke 2 weeks ago. Additional history provided: Left-sided facial droop, left arm and leg weakness, slurred speech, last known normal 8 p.m. yesterday. EXAM: MRI HEAD WITHOUT CONTRAST TECHNIQUE: Multiplanar, multiecho pulse sequences of the brain and surrounding structures were obtained without intravenous contrast. COMPARISON:  Noncontrast head CT, CT angiogram head/neck and CT perfusion 12/02/2020. MRI/MRA head 11/21/2020. FINDINGS: Brain: Intermittently motion degraded examination. Most notably, there is mild-to-moderate motion degradation of the sagittal T1 weighted sequence, moderate motion degradation of the axial T2 weighted sequence, mild-to-moderate motion degradation of the axial T2/FLAIR sequence,  mild-to-moderate motion degradation of the axial SWI sequence and moderate motion degradation of the coronal T2 weighted sequence. Mild cerebral atrophy. Acute/early subacute cortical infarction changes within the right parietal lobe (PCA vascular territory), slightly progressed as compared to the brain MRI of 11/21/2020. Additionally, there are a few subcentimeter acute/early subacute infarcts within  the right frontoparietal white matter which are new from the prior MRI. There has been otherwise unremarkable evolution of previously demonstrated subacute infarcts within the right cerebral hemisphere. As before, these infarcts most notably affect the right PCA territory, including the callosal splenium, right parietal lobe, right occipital lobe and right temporal lobe. Redemonstrated cortical laminar necrosis and subacute petechial hemorrhage within portions of the subacute infarction territory. Redemonstrated chronic cortical infarcts within the left parietal lobe. Background mild multifocal T2/FLAIR hyperintensity within the cerebral white matter, nonspecific but compatible with chronic small vessel ischemic disease. No evidence of intracranial mass. No extra-axial fluid collection. No midline shift. Vascular: Expected proximal arterial flow voids. Skull and upper cervical spine: No focal marrow lesion. Susceptibility artifact from cervical spinal fusion hardware. Sinuses/Orbits: Visualized orbits show no acute finding. Trace bilateral ethmoid sinus mucosal thickening. IMPRESSION: Motion degraded examination, as described. Acute/early subacute cortical infarction changes within the right parietal lobe (PCA vascular territory), slightly progressed from the brain MRI of 11/21/2020. Additionally, there are a few subcentimeter acute/early subacute infarcts within the right frontoparietal white matter which are new from the prior MRI. Otherwise unremarkable evolution of known subacute infarcts within the right cerebral  hemisphere. As before, these infarcts most notably affect the right PCA territory, including the callosal splenium, right parietal lobe, right occipital lobe and right temporal lobe. Redemonstrated cortical laminar necrosis and subacute petechial hemorrhage within portions of the subacute infarction territory. Redemonstrated chronic left parietal lobe cortical infarcts. Stable background mild cerebral atrophy and chronic small vessel ischemic disease. Electronically Signed   By: Kellie Simmering DO   On: 12/02/2020 19:56   CT CEREBRAL PERFUSION W CONTRAST  Result Date: 12/02/2020 CLINICAL DATA:  Left-sided weakness EXAM: CT ANGIOGRAPHY HEAD AND NECK CT PERFUSION BRAIN TECHNIQUE: Multidetector CT imaging of the head and neck was performed using the standard protocol during bolus administration of intravenous contrast. Multiplanar CT image reconstructions and MIPs were obtained to evaluate the vascular anatomy. Carotid stenosis measurements (when applicable) are obtained utilizing NASCET criteria, using the distal internal carotid diameter as the denominator. Multiphase CT imaging of the brain was performed following IV bolus contrast injection. Subsequent parametric perfusion maps were calculated using RAPID software. CONTRAST:  120mL OMNIPAQUE IOHEXOL 350 MG/ML SOLN COMPARISON:  CTA 11/21/2020 FINDINGS: CTA NECK FINDINGS Aortic arch: Stable appearance with primarily calcified plaque along the arch and great vessel origins. Right carotid system: Common carotid is patent. As before, soft tissue thickening is noted about the bifurcation, which may reflect prior endarterectomy. Remains similar critical stenosis of the proximal ICA with near occlusion. Left carotid system: Patent. As before, there is a patent stent spanning distal common and proximal internal carotid arteries. Vertebral arteries: Patent. Plaque at the left vertebral origin. No new stenosis. Skeleton: Stable appearance of degenerative and postoperative  changes of the cervical spine. Degenerative changes of the left temporomandibular joint. Other neck: No new findings. Upper chest: Imaged in expiration. Right upper lobe nodule is not well evaluated. Review of the MIP images confirms the above findings CTA HEAD FINDINGS Anterior circulation: Intracranial internal carotid arteries are patent with calcified plaque causing mild stenosis. Anterior and middle cerebral arteries are patent. Posterior circulation: Intracranial vertebral arteries are patent with calcified plaque causing mild stenosis on the left. Basilar artery is patent. Major cerebellar artery origins are patent. Stable severe stenosis of the right P2 PCA. Left posterior cerebral artery is patent. Venous sinuses: As permitted by contrast timing, patent. Review of the MIP images confirms the above findings  CT Brain Perfusion Findings: CBF (<30%) Volume: 68mL Perfusion (Tmax>6.0s) volume: 34mL Mismatch Volume: 53mL Infarction Location:Number is located within the area of right PCA infarction with additional involvement of right MCA watershed territory. IMPRESSION: No significant change since CTA 11/21/2020. No new large vessel occlusion. Remains critical stenosis of the proximal right ICA with near occlusion. Perfusion imaging demonstrates no evidence of core infarction. There is calculated penumbra involving the area of subacute right PCA territory infarction as well as the right MCA watershed territory likely related to above. Electronically Signed   By: Macy Mis M.D.   On: 12/02/2020 14:21   DG Chest Portable 1 View  Result Date: 12/02/2020 CLINICAL DATA:  Patient for surgery.  Carotid artery disease. EXAM: PORTABLE CHEST 1 VIEW COMPARISON:  Single-view of the chest 11/20/2020. FINDINGS: There is cardiomegaly and mild vascular congestion. No consolidative process, pneumothorax or effusion. Aortic atherosclerosis is noted. Suture material projects over the lower left chest. No acute or focal bony  abnormality. IMPRESSION: Cardiomegaly and mild vascular congestion. Electronically Signed   By: Inge Rise M.D.   On: 12/02/2020 14:53   CT HEAD CODE STROKE WO CONTRAST  Result Date: 12/02/2020 CLINICAL DATA:  Code stroke.  Left facial droop and weakness EXAM: CT HEAD WITHOUT CONTRAST TECHNIQUE: Contiguous axial images were obtained from the base of the skull through the vertex without intravenous contrast. COMPARISON:  11/21/2020 FINDINGS: Brain: No acute intracranial hemorrhage, mass effect, or edema. No new loss of gray-white differentiation. Small right cerebral infarcts on recent prior MRI are not well seen. Subacute infarct of the right PCA territory and chronic infarct of the left parietal lobe. Additional patchy hypoattenuation in the supratentorial white matter is nonspecific but probably reflects similar chronic microvascular ischemic changes. Ventricles and sulci are stable in size and configuration. Vascular: No hyperdense vessel. There is intracranial atherosclerotic calcification at the skull base. Skull: Unremarkable. Sinuses/Orbits: Aerated.  No acute orbital abnormality. Other: Mastoid air cells are clear. ASPECTS ([place] Stroke Program Early CT Score) - Ganglionic level infarction (caudate, lentiform nuclei, internal capsule, insula, M1-M3 cortex): 7 - Supraganglionic infarction (M4-M6 cortex): 3 Total score (0-10 with 10 being normal): 10 IMPRESSION: There is no acute intracranial hemorrhage or evidence of acute infarction. ASPECT score is 10. No significant change since recent prior study. Small infarcts on the prior MRI are not well seen. Subacute and chronic infarcts as seen previously. Initial results were communicated to Dr. Quinn Axe At 1:49 pm on 12/02/2020 by text page via the [facility] messaging system. Electronically Signed   By: Macy Mis M.D.   On: 12/02/2020 13:59   CT ANGIO HEAD CODE STROKE  Result Date: 12/02/2020 CLINICAL DATA:  Left-sided weakness EXAM: CT ANGIOGRAPHY  HEAD AND NECK CT PERFUSION BRAIN TECHNIQUE: Multidetector CT imaging of the head and neck was performed using the standard protocol during bolus administration of intravenous contrast. Multiplanar CT image reconstructions and MIPs were obtained to evaluate the vascular anatomy. Carotid stenosis measurements (when applicable) are obtained utilizing NASCET criteria, using the distal internal carotid diameter as the denominator. Multiphase CT imaging of the brain was performed following IV bolus contrast injection. Subsequent parametric perfusion maps were calculated using RAPID software. CONTRAST:  121mL OMNIPAQUE IOHEXOL 350 MG/ML SOLN COMPARISON:  CTA 11/21/2020 FINDINGS: CTA NECK FINDINGS Aortic arch: Stable appearance with primarily calcified plaque along the arch and great vessel origins. Right carotid system: Common carotid is patent. As before, soft tissue thickening is noted about the bifurcation, which may reflect prior endarterectomy. Remains  similar critical stenosis of the proximal ICA with near occlusion. Left carotid system: Patent. As before, there is a patent stent spanning distal common and proximal internal carotid arteries. Vertebral arteries: Patent. Plaque at the left vertebral origin. No new stenosis. Skeleton: Stable appearance of degenerative and postoperative changes of the cervical spine. Degenerative changes of the left temporomandibular joint. Other neck: No new findings. Upper chest: Imaged in expiration. Right upper lobe nodule is not well evaluated. Review of the MIP images confirms the above findings CTA HEAD FINDINGS Anterior circulation: Intracranial internal carotid arteries are patent with calcified plaque causing mild stenosis. Anterior and middle cerebral arteries are patent. Posterior circulation: Intracranial vertebral arteries are patent with calcified plaque causing mild stenosis on the left. Basilar artery is patent. Major cerebellar artery origins are patent. Stable severe  stenosis of the right P2 PCA. Left posterior cerebral artery is patent. Venous sinuses: As permitted by contrast timing, patent. Review of the MIP images confirms the above findings CT Brain Perfusion Findings: CBF (<30%) Volume: 78mL Perfusion (Tmax>6.0s) volume: 59mL Mismatch Volume: 56mL Infarction Location:Number is located within the area of right PCA infarction with additional involvement of right MCA watershed territory. IMPRESSION: No significant change since CTA 11/21/2020. No new large vessel occlusion. Remains critical stenosis of the proximal right ICA with near occlusion. Perfusion imaging demonstrates no evidence of core infarction. There is calculated penumbra involving the area of subacute right PCA territory infarction as well as the right MCA watershed territory likely related to above. Electronically Signed   By: Macy Mis M.D.   On: 12/02/2020 14:21   CT ANGIO NECK CODE STROKE  Result Date: 12/02/2020 CLINICAL DATA:  Left-sided weakness EXAM: CT ANGIOGRAPHY HEAD AND NECK CT PERFUSION BRAIN TECHNIQUE: Multidetector CT imaging of the head and neck was performed using the standard protocol during bolus administration of intravenous contrast. Multiplanar CT image reconstructions and MIPs were obtained to evaluate the vascular anatomy. Carotid stenosis measurements (when applicable) are obtained utilizing NASCET criteria, using the distal internal carotid diameter as the denominator. Multiphase CT imaging of the brain was performed following IV bolus contrast injection. Subsequent parametric perfusion maps were calculated using RAPID software. CONTRAST:  135mL OMNIPAQUE IOHEXOL 350 MG/ML SOLN COMPARISON:  CTA 11/21/2020 FINDINGS: CTA NECK FINDINGS Aortic arch: Stable appearance with primarily calcified plaque along the arch and great vessel origins. Right carotid system: Common carotid is patent. As before, soft tissue thickening is noted about the bifurcation, which may reflect prior  endarterectomy. Remains similar critical stenosis of the proximal ICA with near occlusion. Left carotid system: Patent. As before, there is a patent stent spanning distal common and proximal internal carotid arteries. Vertebral arteries: Patent. Plaque at the left vertebral origin. No new stenosis. Skeleton: Stable appearance of degenerative and postoperative changes of the cervical spine. Degenerative changes of the left temporomandibular joint. Other neck: No new findings. Upper chest: Imaged in expiration. Right upper lobe nodule is not well evaluated. Review of the MIP images confirms the above findings CTA HEAD FINDINGS Anterior circulation: Intracranial internal carotid arteries are patent with calcified plaque causing mild stenosis. Anterior and middle cerebral arteries are patent. Posterior circulation: Intracranial vertebral arteries are patent with calcified plaque causing mild stenosis on the left. Basilar artery is patent. Major cerebellar artery origins are patent. Stable severe stenosis of the right P2 PCA. Left posterior cerebral artery is patent. Venous sinuses: As permitted by contrast timing, patent. Review of the MIP images confirms the above findings CT Brain Perfusion Findings:  CBF (<30%) Volume: 76mL Perfusion (Tmax>6.0s) volume: 46mL Mismatch Volume: 70mL Infarction Location:Number is located within the area of right PCA infarction with additional involvement of right MCA watershed territory. IMPRESSION: No significant change since CTA 11/21/2020. No new large vessel occlusion. Remains critical stenosis of the proximal right ICA with near occlusion. Perfusion imaging demonstrates no evidence of core infarction. There is calculated penumbra involving the area of subacute right PCA territory infarction as well as the right MCA watershed territory likely related to above. Electronically Signed   By: Macy Mis M.D.   On: 12/02/2020 14:21   Discharge Instructions: Barbara French,   It was a  pleasure taking care of you during this admission.  You were hospitalized for an a stroke due to the right internal carotid artery blockage.  You underwent a stent placement to the artery.  Please stop taking aspirin and start Brilinta for 30 days.  Please follow-up with neurology and vascular interventional radiology after discharge.  Please follow-up with your primary care doctor in 1 week for repeat blood work.  They will help you set up outpatient physical and occupational therapy.   Take care   Discharge Instructions    Call MD for:  difficulty breathing, headache or visual disturbances   Complete by: As directed    Call MD for:  severe uncontrolled pain   Complete by: As directed    Call MD for:  temperature >100.4   Complete by: As directed    Diet - low sodium heart healthy   Complete by: As directed    Increase activity slowly   Complete by: As directed    No wound care   Complete by: As directed       Signed: Gaylan Gerold, DO 12/06/2020, 12:43 PM   Pager: [phone]

## 2020-12-06 NOTE — Progress Notes (Signed)
Physical Therapy Treatment Patient Details Name: Barbara French MRN: [id number] DOB: 07-21-1956 Today's Date: 12/06/2020    History of Present Illness Pt is 65 yo female who presents after falling down 4 steps and being unable to get up, L facial droop, slurred speech. MRI showed extension of R PCA infarct from 4/13/222. Pt scheduled for Rt ICA stent 4/28. PMH: Bil carotid stenosis s/p R carotid endarterectomy 12/21 and L stent placement, HTN, DM2, smoker, PE, COPD, R TKA, multiple neck and back surgeries, and narcolepsy.    PT Comments    The pt was able to demo good continued progress with PT, completing increased hallway ambulation and navigation of stairs without need for AD or UE support. Pt had a few mild LOB with gait challenges such as walking with head turns, stepping over objects, and quick changes in direction. The pt was able to complete floor to chair transfers without verbal cues to improve independence with fall recovery given the pt remains a high fall risk and is returning home. The pt continues to express desire to return home despite deficits, demos slightly impaired safety awareness and requires cues to fully attend to environment. Continue to recommend skilled PT and supervision from family to reduce further risk of falls and fall-related injury.   Dynamic Gait Index (DGI): 16/24 (<19 indicates increased risk for falls)      Follow Up Recommendations  Home health PT;Supervision/Assistance - 24 hour     Equipment Recommendations  None recommended by PT    Recommendations for Other Services       Precautions / Restrictions Precautions Precautions: Fall Precaution Comments: pt has had multiple falls down stairs at home including one PTA yesterday; however, pt reports these have been associated with the occurrence of the strokes.  She also states that she was trying to navigate four steps with a RW and lost her balance Restrictions Weight Bearing Restrictions: No     Mobility  Bed Mobility Overal bed mobility: Modified Independent             General bed mobility comments: pt OOB standing in room upon arrival    Transfers Overall transfer level: Needs assistance Equipment used: None Transfers: Sit to/from Stand Sit to Stand: Supervision         General transfer comment: pt standing in room upon arrival of PT, able to complete multiple sit-stand without assist or instability within session  Ambulation/Gait Ambulation/Gait assistance: Min guard Gait Distance (Feet): 150 Feet Assistive device: None Gait Pattern/deviations: Step-through pattern;Decreased stride length;Staggering left Gait velocity: 0.6 m/s Gait velocity interpretation: 1.31 - 2.62 ft/sec, indicative of limited community ambulator General Gait Details: pt able to steady without UE support, but required cues to visually scan to adjust for L visual field cuts. mild LOB only with challenges such as marching, stepping over objects, head turns, and changes in speed.   Stairs Stairs: Yes Stairs assistance: Min guard Stair Management: One rail Right;Alternating pattern;Forwards Number of Stairs: 4 General stair comments: no LOB, single UE support required   Wheelchair Mobility    Modified Rankin (Stroke Patients Only) Modified Rankin (Stroke Patients Only) Pre-Morbid Rankin Score: Moderate disability Modified Rankin: Moderately severe disability     Balance Overall balance assessment: Needs assistance;History of Falls Sitting-balance support: No upper extremity supported;Feet supported Sitting balance-Leahy Scale: Good     Standing balance support: During functional activity Standing balance-Leahy Scale: Good Standing balance comment: able to perform ADLs in sitting and standing without LOB or UE support  Standardized Balance Assessment Standardized Balance Assessment : Dynamic Gait Index   Dynamic Gait Index Level Surface: Mild  Impairment Change in Gait Speed: Mild Impairment Gait with Horizontal Head Turns: Mild Impairment Gait with Vertical Head Turns: Mild Impairment Gait and Pivot Turn: Normal Step Over Obstacle: Moderate Impairment Step Around Obstacles: Mild Impairment Steps: Mild Impairment Total Score: 16      Cognition Arousal/Alertness: Awake/alert Behavior During Therapy: WFL for tasks assessed/performed Overall Cognitive Status: Impaired/Different from baseline Area of Impairment: Attention;Memory;Following commands;Safety/judgement;Awareness;Problem solving                   Current Attention Level: Selective Memory: Decreased short-term memory Following Commands: Follows one step commands consistently;Follows multi-step commands inconsistently Safety/Judgement: Decreased awareness of safety Awareness: Emergent Problem Solving: Requires verbal cues;Requires tactile cues General Comments: Pt able to follow simple commands with good consistency. unable to recall room number without cues, but able to find her way back through counting when directed to signs. able to sequence without cues for transfers      Exercises Other Exercises Other Exercises: Pt provided with visual field deficit handout, managing environment handout for low vision, and memory strategies handout.  These were reviewed with her and she verbalized understanding.    General Comments General comments (skin integrity, edema, etc.): practiced floor to chair transfers which pt was able to complete without physical assist, discussed how to manage in her home environment, fall prevention such as lighting and reduction of clutter, and visual scanning.      Pertinent Vitals/Pain Pain Assessment: No/denies pain     PT Goals (current goals can now be found in the care plan section) Acute Rehab PT Goals Patient Stated Goal: get better PT Goal Formulation: With patient Time For Goal Achievement: 12/17/20 Potential to  Achieve Goals: Good Progress towards PT goals: Progressing toward goals    Frequency    Min 4X/week      PT Plan Discharge plan needs to be updated       AM-PAC PT "6 Clicks" Mobility   Outcome Measure  Help needed turning from your back to your side while in a flat bed without using bedrails?: None Help needed moving from lying on your back to sitting on the side of a flat bed without using bedrails?: None Help needed moving to and from a bed to a chair (including a wheelchair)?: A Little Help needed standing up from a chair using your arms (e.g., wheelchair or bedside chair)?: A Little Help needed to walk in hospital room?: A Little Help needed climbing 3-5 steps with a railing? : A Little 6 Click Score: 20    End of Session Equipment Utilized During Treatment: Gait belt Activity Tolerance: Patient tolerated treatment well Patient left: in bed;with call bell/phone within reach;with bed alarm set Nurse Communication: Mobility status PT Visit Diagnosis: Unsteadiness on feet (R26.81);Repeated falls (R29.6);Pain;Difficulty in walking, not elsewhere classified (R26.2)     Time: 4239-5320 PT Time Calculation (min) (ACUTE ONLY): 14 min  Charges:  $Gait Training: 8-22 mins                     Karma Ganja, PT, DPT   Acute Rehabilitation Department Pager #: [phone]   Otho Bellows 12/06/2020, 2:57 PM

## 2020-12-09 ENCOUNTER — Other Ambulatory Visit: Payer: Self-pay | Admitting: *Deleted

## 2020-12-09 DIAGNOSIS — I639 Cerebral infarction, unspecified: Secondary | ICD-10-CM

## 2020-12-09 NOTE — Patient Outreach (Signed)
[facility]) Care Management  [place]  12/10/2020   Barbara French [birth year] [id number]   RED ON EMMI ALERT - Stroke Day # 1 Date: 5/1 Red Alert Reason: Not scheduled follow up appointment   Outreach attempt #1, successful.  Identity verified.  This care manager introduced self and stated purpose of call.  [facility] care management services explained.    Social: Member report recurrent stroke within the last couple months, had stent placed this time.  No longer lives alone, lives with daughter but report needing more assistance in the home as her daughter works from [address].  Denies having Medicaid but report needing it, willing to apply.  She has lost most of her vision in her left eye and 50% in her right eye, has had multiple falls in the last 3 months.  Will start with home health for PT and OT tomorrow.    Conditions: Per chart, history of stroke, HTN (monitors blood pressure daily, today was 149/84), Diabetes with A1C of 6.7 (blood sugar today was 99), PAD, CAD, COPD, and dyslipidemia.  Medications: Reviewed with member, state her daughter prepares her meds daily and calls her throughout the day to make sure the is taking them.  Reviewed instructions for Brilinta, aware that she is to take for the next 30 days then restart Plavix.   Denies any urgent concerns, encouraged to contact this care manager with questions.    Encounter Medications:  Outpatient Encounter Medications as of 12/09/2020  Medication Sig Note  . acetaminophen (TYLENOL) 500 MG tablet Take 1,000 mg by mouth 4 (four) times daily as needed for moderate pain or headache.   . albuterol (VENTOLIN HFA) 108 (90 Base) MCG/ACT inhaler Inhale 1-2 puffs into the lungs 4 (four) times daily as needed for wheezing or shortness of breath.   [name] Kitchen amLODipine (NORVASC) 5 MG tablet Take 1 tablet by mouth every morning.   [name] Kitchen amoxicillin-clavulanate (AUGMENTIN) 875-125 MG tablet Take 1 tablet by mouth 2 (two) times  daily. 12/02/2020: 7 day course started 11/28/2020  . amphetamine-dextroamphetamine (ADDERALL) 15 MG tablet Take 15 mg by mouth daily as needed (when needs to be alert/awake; for MD appointments). 12/02/2020: #60/30 days filled 10/03/2020 [facility] per PMP AWARE  . apixaban (ELIQUIS) 5 MG TABS tablet Take 5 mg by mouth 2 (two) times daily. 12/02/2020: On hold due to procedure scheduled for 12/04/2020  . atorvastatin (LIPITOR) 40 MG tablet Take 40 mg by mouth every evening.   . cilostazol (PLETAL) 50 MG tablet Take 100 mg by mouth 2 (two) times daily.   . cyclobenzaprine (FLEXERIL) 10 MG tablet Take 10 mg by mouth 3 (three) times daily.   [name] Kitchen EPINEPHrine 0.3 mg/0.3 mL IJ SOAJ injection Inject 0.3 mg into the muscle once as needed for anaphylaxis.   [name] Kitchen escitalopram (LEXAPRO) 20 MG tablet Take 20 mg by mouth every morning.   . gabapentin (NEURONTIN) 300 MG capsule Take 300 mg by mouth 3 (three) times daily.   [name] Kitchen HYDROmorphone (DILAUDID) 4 MG tablet Take 4 mg by mouth 4 (four) times daily as needed (pain). 12/02/2020: #105/26 filled 11/13/2020 [facility] per PMP AWARE  . losartan (COZAAR) 50 MG tablet Take 50 mg by mouth every morning.   . Melatonin 5 MG CHEW Chew 10 mg by mouth at bedtime.   . metFORMIN (GLUCOPHAGE) 500 MG tablet Take 500 mg by mouth 2 (two) times daily with a meal.   . nitroGLYCERIN (NITROSTAT) 0.4 MG SL tablet Place 1 tablet (0.4  mg total) under the tongue every 5 (five) minutes as needed for chest pain. 12/02/2020: Pt took 2 tablets one day last week  . pantoprazole (PROTONIX) 40 MG tablet Take 40 mg by mouth every morning.   . potassium chloride (KLOR-CON) 10 MEQ tablet Take 10 mEq by mouth every morning.   . sitaGLIPtin (JANUVIA) 100 MG tablet Take 100 mg by mouth every morning.   . ticagrelor (BRILINTA) 90 MG TABS tablet Take 1 tablet (90 mg total) by mouth 2 (two) times daily for 28 days.    No facility-administered encounter medications on file as of 12/09/2020.    Functional Status:  In  your present state of health, do you have any difficulty performing the following activities: 12/05/2020 12/02/2020  Hearing? N N  Vision? N Y  Difficulty concentrating or making decisions? N N  Walking or climbing stairs? - N  Dressing or bathing? - N  Doing errands, shopping? - Y  Some recent data might be hidden    Fall/Depression Screening: No flowsheet data found. PHQ 2/9 Scores 12/09/2020  PHQ - 2 Score 1    Assessment:  Goals Addressed            This Visit's Progress   . Find Help in My Community       Timeframe:  Short-Term Goal Priority:  Medium Start Date:       5/2                      Expected End Date:    6/2                    Barriers: Support System Visual  Follow Up Date 5/16   - follow-up on any referrals for help I am given    Why is this important?    Knowing how and where to find help for yourself or family in your neighborhood and community is an important skill.   You will want to take some steps to learn how.    Notes:   5/2 - Referral placed to Care Guide for food resources and guidance with Medicaid application process    . Make and Keep All Appointments       Timeframe:  Short-Term Goal Priority:  High Start Date:             5/2                Expected End Date:  6/2  Barriers: Transportation Visual                      Follow Up Date 5/16   - ask family or friend for a ride - call to cancel if needed    Why is this important?    Part of staying healthy is seeing the doctor for follow-up care.   If you forget your appointments, there are some things you can do to stay on track.    Notes:   5/2 - Advised of [employer] transportation resources.  Encouraged to ask daughter would this be ok if she is unable to take her to appointments. Conference calls placed to Vascular (appointment changed to 5/16) and Neuro (they will call her back for appointment).  Member will call PCP and Cardiology herself with the help of her daughter (both  providers currently out of the country).    . Plan for Long Term Care-Stroke       Timeframe:  Long-Range Goal Priority:  Medium Start Date:         5/2                    Expected End Date:    7/2  Barriers: Knowledge Support System Visual                    Follow Up Date 5/16    - check out other places when staying at home is no longer possible (assisted living center, nursing home) - check out services like in-home help or adult day care - list the symptoms that would make staying at home too hard    Why is this important?    Having and recovering from a stroke can be scary and stressful.   You can reduce stress by planning.   Preparing for the future is one of the most important things to do.   Thinking about how much care you/your loved one will need and how much it will cost is not easy.   You/your loved one can be part of making decisions for the future.   Making sure that your/your loved one's wishes for care are known is important.     Notes:   5/2 - Discussed Medicaid application and PCS process.  Referral to Care Guide for medicaid guidance.         Plan:  Follow-up:  Patient agrees to Care Plan and Follow-up.  Will notify PCP of [facility] involvement.  Will send member stroke education, [facility] calendar, and 24 hour nurse triage information.  Will place referral to Care Guide.  Will follow up within the next 2 weeks.  [name], [place], MSN [place] [phone]

## 2020-12-10 ENCOUNTER — Encounter: Payer: Self-pay | Admitting: *Deleted

## 2020-12-10 DIAGNOSIS — I69398 Other sequelae of cerebral infarction: Secondary | ICD-10-CM | POA: Diagnosis not present

## 2020-12-10 DIAGNOSIS — K7469 Other cirrhosis of liver: Secondary | ICD-10-CM | POA: Diagnosis not present

## 2020-12-10 DIAGNOSIS — E871 Hypo-osmolality and hyponatremia: Secondary | ICD-10-CM | POA: Diagnosis not present

## 2020-12-10 DIAGNOSIS — I252 Old myocardial infarction: Secondary | ICD-10-CM | POA: Diagnosis not present

## 2020-12-10 DIAGNOSIS — E1151 Type 2 diabetes mellitus with diabetic peripheral angiopathy without gangrene: Secondary | ICD-10-CM | POA: Diagnosis not present

## 2020-12-10 DIAGNOSIS — I4891 Unspecified atrial fibrillation: Secondary | ICD-10-CM | POA: Diagnosis not present

## 2020-12-10 DIAGNOSIS — M159 Polyosteoarthritis, unspecified: Secondary | ICD-10-CM | POA: Diagnosis not present

## 2020-12-10 DIAGNOSIS — I951 Orthostatic hypotension: Secondary | ICD-10-CM | POA: Diagnosis not present

## 2020-12-10 DIAGNOSIS — M47812 Spondylosis without myelopathy or radiculopathy, cervical region: Secondary | ICD-10-CM | POA: Diagnosis not present

## 2020-12-10 DIAGNOSIS — I088 Other rheumatic multiple valve diseases: Secondary | ICD-10-CM | POA: Diagnosis not present

## 2020-12-10 DIAGNOSIS — J449 Chronic obstructive pulmonary disease, unspecified: Secondary | ICD-10-CM | POA: Diagnosis not present

## 2020-12-10 DIAGNOSIS — I6523 Occlusion and stenosis of bilateral carotid arteries: Secondary | ICD-10-CM | POA: Diagnosis not present

## 2020-12-10 DIAGNOSIS — K219 Gastro-esophageal reflux disease without esophagitis: Secondary | ICD-10-CM | POA: Diagnosis not present

## 2020-12-10 DIAGNOSIS — I2510 Atherosclerotic heart disease of native coronary artery without angina pectoris: Secondary | ICD-10-CM | POA: Diagnosis not present

## 2020-12-10 DIAGNOSIS — I119 Hypertensive heart disease without heart failure: Secondary | ICD-10-CM | POA: Diagnosis not present

## 2020-12-10 DIAGNOSIS — G8929 Other chronic pain: Secondary | ICD-10-CM | POA: Diagnosis not present

## 2020-12-10 DIAGNOSIS — E7800 Pure hypercholesterolemia, unspecified: Secondary | ICD-10-CM | POA: Diagnosis not present

## 2020-12-10 DIAGNOSIS — M810 Age-related osteoporosis without current pathological fracture: Secondary | ICD-10-CM | POA: Diagnosis not present

## 2020-12-10 DIAGNOSIS — J309 Allergic rhinitis, unspecified: Secondary | ICD-10-CM | POA: Diagnosis not present

## 2020-12-10 DIAGNOSIS — I700 Atherosclerosis of aorta: Secondary | ICD-10-CM | POA: Diagnosis not present

## 2020-12-10 DIAGNOSIS — G47419 Narcolepsy without cataplexy: Secondary | ICD-10-CM | POA: Diagnosis not present

## 2020-12-10 DIAGNOSIS — E559 Vitamin D deficiency, unspecified: Secondary | ICD-10-CM | POA: Diagnosis not present

## 2020-12-10 DIAGNOSIS — I69354 Hemiplegia and hemiparesis following cerebral infarction affecting left non-dominant side: Secondary | ICD-10-CM | POA: Diagnosis not present

## 2020-12-10 DIAGNOSIS — F32A Depression, unspecified: Secondary | ICD-10-CM | POA: Diagnosis not present

## 2020-12-10 NOTE — Patient Instructions (Signed)
Stroke Prevention Some medical conditions and lifestyle choices can lead to a higher risk for a stroke. You can help to prevent a stroke by eating healthy foods and exercising. It also helps to not smoke and to manage any health problems you may have. How can this condition affect me? A stroke is an emergency. It should be treated right away. A stroke can lead to brain damage or threaten your life. There is a better chance of surviving and getting better after a stroke if you get medical help right away. What can increase my risk? The following medical conditions may increase your risk of a stroke:  Diseases of the heart and blood vessels (cardiovascular disease).  High blood pressure (hypertension).  Diabetes.  High cholesterol.  Sickle cell disease.  Problems with blood clotting.  Being very overweight.  Sleeping problems (obstructivesleep apnea). Other risk factors include:  Being older than age 51.  A history of blood clots, stroke, or mini-stroke (TIA).  Race, ethnic background, or a family history of stroke.  Smoking or using tobacco products.  Taking birth control pills, especially if you smoke.  Heavy alcohol and drug use.  Not being active. What actions can I take to prevent this? Manage your health conditions  High cholesterol. ? Eat a healthy diet. If this is not enough to manage your cholesterol, you may need to take medicines. ? Take medicines as told by your doctor.  High blood pressure. ? Try to keep your blood pressure below 130/80. ? If your blood pressure cannot be managed through a healthy diet and regular exercise, you may need to take medicines. ? Take medicines as told by your doctor. ? Ask your doctor if you should check your blood pressure at home. ? Have your blood pressure checked every year.  Diabetes. ? Eat a healthy diet and get regular exercise. If your blood sugar (glucose) cannot be managed through diet and exercise, you may need to  take medicines. ? Take medicines as told by your doctor.  Talk to your doctor about getting checked for sleeping problems. Signs of a problem can include: ? Snoring a lot. ? Feeling very tired.  Make sure that you manage any other conditions you have. Nutrition  Follow instructions from your doctor about what to eat or drink. You may be told to: ? Eat and drink fewer calories each day. ? Limit how much salt (sodium) you use to 1,500 milligrams (mg) each day. ? Use only healthy fats for cooking, such as olive oil, canola oil, and sunflower oil. ? Eat healthy foods. To do this:  Choose foods that are high in fiber. These include whole grains, and fresh fruits and vegetables.  Eat at least 5 servings of fruits and vegetables a day. Try to fill one-half of your plate with fruits and vegetables at each meal.  Choose low-fat (lean) proteins. These include low-fat cuts of meat, chicken without skin, fish, tofu, beans, and nuts.  Eat low-fat dairy products. ? Avoid foods that:  Are high in salt.  Have saturated fat.  Have trans fat.  Have cholesterol.  Are processed or pre-made. ? Count how many carbohydrates you eat and drink each day.   Lifestyle  If you drink alcohol: ? Limit how much you have to:  0-1 drink a day for women who are not pregnant.  0-2 drinks a day for men. ? Know how much alcohol is in your drink. In the [place] one drink equals one 12 oz bottle  of beer (344mL), one 5 oz glass of wine (171mL), or one 1 oz glass of hard liquor (73mL).  Do not smoke or use any products that have nicotine or tobacco. If you need help quitting, ask your doctor.  Avoid secondhand smoke.  Do not use drugs. Activity  Try to stay at a healthy weight.  Get at least 30 minutes of exercise on most days, such as: ? Fast walking. ? Biking. ? Swimming.   Medicines  Take over-the-counter and prescription medicines only as told by your doctor.  Avoid taking birth control pills.  Talk to your doctor about the risks of taking birth control pills if: ? You are over 36 years old. ? You smoke. ? You get very bad headaches. ? You have had a blood clot. Where to find more information  American Stroke Association: www.strokeassociation.org Get help right away if:  You or a loved one has any signs of a stroke. "BE FAST" is an easy way to remember the warning signs: ? B - Balance. Dizziness, sudden trouble walking, or loss of balance. ? E - Eyes. Trouble seeing or a change in how you see. ? F - Face. Sudden weakness or loss of feeling of the face. The face or eyelid may droop on one side. ? A - Arms. Weakness or loss of feeling in an arm. This happens all of a sudden and most often on one side of the body. ? S - Speech. Sudden trouble speaking, slurred speech, or trouble understanding what people say. ? T - Time. Time to call emergency services. Write down what time symptoms started.  You or a loved one has other signs of a stroke, such as: ? A sudden, very bad headache with no known cause. ? Feeling like you may vomit (nausea). ? Vomiting. ? A seizure. These symptoms may be an emergency. Get help right away. Call your local emergency services (911 in the [place]  Do not wait to see if the symptoms will go away.  Do not drive yourself to the hospital. Summary  You can help to prevent a stroke by eating healthy, exercising, and not smoking. It also helps to manage any health problems you have.  Do not smoke or use any products that contain nicotine or tobacco.  Get help right away if you or a loved one has any signs of a stroke. This information is not intended to replace advice given to you by your health care provider. Make sure you discuss any questions you have with your health care provider. Document Revised: 02/26/2020 Document Reviewed: 02/26/2020 Elsevier Patient Education  [place].

## 2020-12-11 DIAGNOSIS — E1151 Type 2 diabetes mellitus with diabetic peripheral angiopathy without gangrene: Secondary | ICD-10-CM | POA: Diagnosis not present

## 2020-12-11 DIAGNOSIS — I69398 Other sequelae of cerebral infarction: Secondary | ICD-10-CM | POA: Diagnosis not present

## 2020-12-11 DIAGNOSIS — I119 Hypertensive heart disease without heart failure: Secondary | ICD-10-CM | POA: Diagnosis not present

## 2020-12-11 DIAGNOSIS — I951 Orthostatic hypotension: Secondary | ICD-10-CM | POA: Diagnosis not present

## 2020-12-11 DIAGNOSIS — I69354 Hemiplegia and hemiparesis following cerebral infarction affecting left non-dominant side: Secondary | ICD-10-CM | POA: Diagnosis not present

## 2020-12-11 DIAGNOSIS — M810 Age-related osteoporosis without current pathological fracture: Secondary | ICD-10-CM | POA: Diagnosis not present

## 2020-12-11 DIAGNOSIS — M159 Polyosteoarthritis, unspecified: Secondary | ICD-10-CM | POA: Diagnosis not present

## 2020-12-11 DIAGNOSIS — F32A Depression, unspecified: Secondary | ICD-10-CM | POA: Diagnosis not present

## 2020-12-11 DIAGNOSIS — E871 Hypo-osmolality and hyponatremia: Secondary | ICD-10-CM | POA: Diagnosis not present

## 2020-12-11 DIAGNOSIS — I252 Old myocardial infarction: Secondary | ICD-10-CM | POA: Diagnosis not present

## 2020-12-11 DIAGNOSIS — J309 Allergic rhinitis, unspecified: Secondary | ICD-10-CM | POA: Diagnosis not present

## 2020-12-11 DIAGNOSIS — J449 Chronic obstructive pulmonary disease, unspecified: Secondary | ICD-10-CM | POA: Diagnosis not present

## 2020-12-11 DIAGNOSIS — I2510 Atherosclerotic heart disease of native coronary artery without angina pectoris: Secondary | ICD-10-CM | POA: Diagnosis not present

## 2020-12-11 DIAGNOSIS — K7469 Other cirrhosis of liver: Secondary | ICD-10-CM | POA: Diagnosis not present

## 2020-12-11 DIAGNOSIS — M47812 Spondylosis without myelopathy or radiculopathy, cervical region: Secondary | ICD-10-CM | POA: Diagnosis not present

## 2020-12-11 DIAGNOSIS — G47419 Narcolepsy without cataplexy: Secondary | ICD-10-CM | POA: Diagnosis not present

## 2020-12-11 DIAGNOSIS — E559 Vitamin D deficiency, unspecified: Secondary | ICD-10-CM | POA: Diagnosis not present

## 2020-12-11 DIAGNOSIS — I088 Other rheumatic multiple valve diseases: Secondary | ICD-10-CM | POA: Diagnosis not present

## 2020-12-11 DIAGNOSIS — K219 Gastro-esophageal reflux disease without esophagitis: Secondary | ICD-10-CM | POA: Diagnosis not present

## 2020-12-11 DIAGNOSIS — I700 Atherosclerosis of aorta: Secondary | ICD-10-CM | POA: Diagnosis not present

## 2020-12-11 DIAGNOSIS — I6523 Occlusion and stenosis of bilateral carotid arteries: Secondary | ICD-10-CM | POA: Diagnosis not present

## 2020-12-11 DIAGNOSIS — G8929 Other chronic pain: Secondary | ICD-10-CM | POA: Diagnosis not present

## 2020-12-11 DIAGNOSIS — I4891 Unspecified atrial fibrillation: Secondary | ICD-10-CM | POA: Diagnosis not present

## 2020-12-11 DIAGNOSIS — E7800 Pure hypercholesterolemia, unspecified: Secondary | ICD-10-CM | POA: Diagnosis not present

## 2020-12-12 ENCOUNTER — Other Ambulatory Visit: Payer: Self-pay | Admitting: Interventional Radiology

## 2020-12-12 ENCOUNTER — Telehealth: Payer: Self-pay | Admitting: *Deleted

## 2020-12-12 DIAGNOSIS — Z8673 Personal history of transient ischemic attack (TIA), and cerebral infarction without residual deficits: Secondary | ICD-10-CM

## 2020-12-12 NOTE — Telephone Encounter (Signed)
   Telephone encounter was:  Unsuccessful.  12/12/2020 Name: Barbara French MRN: [id number] DOB: 09-10-1955  Unsuccessful outbound call made today to assist with:  Transportation Needs  and Financial Difficulties related to housing and transportation  Outreach Attempt:  1st Attempt   Rang and then machine asked for access code    [place] , [place], Care Management  [phone] [address] , [place] [address] Email : [email or web address]

## 2020-12-16 ENCOUNTER — Other Ambulatory Visit: Payer: Self-pay | Admitting: *Deleted

## 2020-12-16 ENCOUNTER — Telehealth: Payer: Self-pay | Admitting: *Deleted

## 2020-12-16 DIAGNOSIS — H2513 Age-related nuclear cataract, bilateral: Secondary | ICD-10-CM | POA: Diagnosis not present

## 2020-12-16 DIAGNOSIS — H5340 Unspecified visual field defects: Secondary | ICD-10-CM | POA: Diagnosis not present

## 2020-12-16 DIAGNOSIS — Z7984 Long term (current) use of oral hypoglycemic drugs: Secondary | ICD-10-CM | POA: Diagnosis not present

## 2020-12-16 DIAGNOSIS — H35443 Age-related reticular degeneration of retina, bilateral: Secondary | ICD-10-CM | POA: Diagnosis not present

## 2020-12-16 NOTE — Patient Instructions (Signed)
Managing the Challenge of Quitting Smoking Quitting smoking is a physical and mental challenge. You will face cravings, withdrawal symptoms, and temptation. Before quitting, work with your health care provider to make a plan that can help you manage quitting. Preparation can help you quit and keep you from giving in. How to manage lifestyle changes Managing stress Stress can make you want to smoke, and wanting to smoke may cause stress. It is important to find ways to manage your stress. You might try some of the following:  Practice relaxation techniques. ? Breathe slowly and deeply, in through your nose and out through your mouth. ? Listen to music. ? Soak in a bath or take a shower. ? Imagine a peaceful place or vacation.  Get some support. ? Talk with family or friends about your stress. ? Join a support group. ? Talk with a counselor or therapist.  Get some physical activity. ? Go for a walk, run, or bike ride. ? Play a favorite sport. ? Practice yoga.   Medicines Talk with your health care provider about medicines that might help you deal with cravings and make quitting easier for you. Relationships Social situations can be difficult when you are quitting smoking. To manage this, you can:  Avoid parties and other social situations where people might be smoking.  Avoid alcohol.  Leave right away if you have the urge to smoke.  Explain to your family and friends that you are quitting smoking. Ask for support and let them know you might be a bit grumpy.  Plan activities where smoking is not an option. General instructions Be aware that many people gain weight after they quit smoking. However, not everyone does. To keep from gaining weight, have a plan in place before you quit and stick to the plan after you quit. Your plan should include:  Having healthy snacks. When you have a craving, it may help to: ? Eat popcorn, carrots, celery, or other cut vegetables. ? Chew  sugar-free gum.  Changing how you eat. ? Eat small portion sizes at meals. ? Eat 4-6 small meals throughout the day instead of 1-2 large meals a day. ? Be mindful when you eat. Do not watch television or do other things that might distract you as you eat.  Exercising regularly. ? Make time to exercise each day. If you do not have time for a long workout, do short bouts of exercise for 5-10 minutes several times a day. ? Do some form of strengthening exercise, such as weight lifting. ? Do some exercise that gets your heart beating and causes you to breathe deeply, such as walking fast, running, swimming, or biking. This is very important.  Drinking plenty of water or other low-calorie or no-calorie drinks. Drink 6-8 glasses of water daily.   How to recognize withdrawal symptoms Your body and mind may experience discomfort as you try to get used to not having nicotine in your system. These effects are called withdrawal symptoms. They may include:  Feeling hungrier than normal.  Having trouble concentrating.  Feeling irritable or restless.  Having trouble sleeping.  Feeling depressed.  Craving a cigarette. To manage withdrawal symptoms:  Avoid places, people, and activities that trigger your cravings.  Remember why you want to quit.  Get plenty of sleep.  Avoid coffee and other caffeinated drinks. These may worsen some of your symptoms. These symptoms may surprise you. But be assured that they are normal to have when quitting smoking. How to manage cravings  Come up with a plan for how to deal with your cravings. The plan should include the following:  A definition of the specific situation you want to deal with.  An alternative action you will take.  A clear idea for how this action will help.  The name of someone who might help you with this. Cravings usually last for 5-10 minutes. Consider taking the following actions to help you with your plan to deal with  cravings:  Keep your mouth busy. ? Chew sugar-free gum. ? Suck on hard candies or a straw. ? Brush your teeth.  Keep your hands and body busy. ? Change to a different activity right away. ? Squeeze or play with a ball. ? Do an activity or a hobby, such as making bead jewelry, practicing needlepoint, or working with wood. ? Mix up your normal routine. ? Take a short exercise break. Go for a quick walk or run up and down stairs.  Focus on doing something kind or helpful for someone else.  Call a friend or family member to talk during a craving.  Join a support group.  Contact a quitline. Where to find support To get help or find a support group:  Call the [facility]'s Smoking Quitline: 1-800-QUIT NOW 7191011368)  Visit the website of the Substance Abuse and [place]: ktimeonline.com  Text QUIT to SmokefreeTXT: 578469 Where to find more information Visit these websites to find more information on quitting smoking:  [place]: www.smokefree.gov  American Lung Association: www.lung.org  American Cancer Society: www.cancer.org  Centers for Disease Control and Prevention: http://www.wolf.info/  American Heart Association: www.heart.org Contact a health care provider if:  You want to change your plan for quitting.  The medicines you are taking are not helping.  Your eating feels out of control or you cannot sleep. Get help right away if:  You feel depressed or become very anxious. Summary  Quitting smoking is a physical and mental challenge. You will face cravings, withdrawal symptoms, and temptation to smoke again. Preparation can help you as you go through these challenges.  Try different techniques to manage stress, handle social situations, and prevent weight gain.  You can deal with cravings by keeping your mouth busy (such as by chewing gum), keeping your hands and body busy, calling family or friends, or  contacting a quitline for people who want to quit smoking.  You can deal with withdrawal symptoms by avoiding places where people smoke, getting plenty of rest, and avoiding drinks with caffeine. This information is not intended to replace advice given to you by your health care provider. Make sure you discuss any questions you have with your health care provider. Document Revised: 05/16/2019 Document Reviewed: 05/16/2019 Elsevier Patient Education  [place].

## 2020-12-16 NOTE — Patient Outreach (Signed)
[place] [facility]) Care Management  12/16/2020  Barbara French Jul 08, 1956 [id number]   RED ON EMMI ALERT - Stroke Day # 6 Date: 5/6 Red Alert Reason: smoked or been around smoke   Outreach attempt #1, successful.  Member admits that she is still struggling with smoking cessation.  She has contacted PCP office to get prescription for Nicotine patches.  Aware of the increased risk of recurrent stroke if continue to smoke.  Denies any urgent concerns, encouraged to contact this care manager with questions.     Plan: RN CM will send education regarding smoking cessation and follow up within the next 2 weeks as planned.  [name], [place], MSN [place] [phone]

## 2020-12-16 NOTE — Telephone Encounter (Signed)
   Telephone encounter was:  Successful.  12/16/2020 Name: Barbara French MRN: [id number] DOB: 01/12/1956  Barbara French is a 65 y.o. year old female who is a primary care patient of [place], Gae Bon, MD . The community resource team was consulted for assistance with Transportation Needs  and [place] guide performed the following interventions: Patient provided with information about care guide support team and interviewed to confirm resource needs.  Follow Up Plan:  Care guide will follow up with patient by phone over the next Talked with patient on her way out to dr appt call baclk tomorrow at [place] , [place], Care Management  [phone] [address] , [place] [address] Email : [email or web address]

## 2020-12-17 DIAGNOSIS — J449 Chronic obstructive pulmonary disease, unspecified: Secondary | ICD-10-CM | POA: Diagnosis not present

## 2020-12-17 DIAGNOSIS — I088 Other rheumatic multiple valve diseases: Secondary | ICD-10-CM | POA: Diagnosis not present

## 2020-12-17 DIAGNOSIS — I119 Hypertensive heart disease without heart failure: Secondary | ICD-10-CM | POA: Diagnosis not present

## 2020-12-17 DIAGNOSIS — K7469 Other cirrhosis of liver: Secondary | ICD-10-CM | POA: Diagnosis not present

## 2020-12-17 DIAGNOSIS — F32A Depression, unspecified: Secondary | ICD-10-CM | POA: Diagnosis not present

## 2020-12-17 DIAGNOSIS — I69354 Hemiplegia and hemiparesis following cerebral infarction affecting left non-dominant side: Secondary | ICD-10-CM | POA: Diagnosis not present

## 2020-12-17 DIAGNOSIS — G8929 Other chronic pain: Secondary | ICD-10-CM | POA: Diagnosis not present

## 2020-12-17 DIAGNOSIS — E7800 Pure hypercholesterolemia, unspecified: Secondary | ICD-10-CM | POA: Diagnosis not present

## 2020-12-17 DIAGNOSIS — E871 Hypo-osmolality and hyponatremia: Secondary | ICD-10-CM | POA: Diagnosis not present

## 2020-12-17 DIAGNOSIS — I4891 Unspecified atrial fibrillation: Secondary | ICD-10-CM | POA: Diagnosis not present

## 2020-12-17 DIAGNOSIS — I252 Old myocardial infarction: Secondary | ICD-10-CM | POA: Diagnosis not present

## 2020-12-17 DIAGNOSIS — E559 Vitamin D deficiency, unspecified: Secondary | ICD-10-CM | POA: Diagnosis not present

## 2020-12-17 DIAGNOSIS — G47419 Narcolepsy without cataplexy: Secondary | ICD-10-CM | POA: Diagnosis not present

## 2020-12-17 DIAGNOSIS — M47812 Spondylosis without myelopathy or radiculopathy, cervical region: Secondary | ICD-10-CM | POA: Diagnosis not present

## 2020-12-17 DIAGNOSIS — I6523 Occlusion and stenosis of bilateral carotid arteries: Secondary | ICD-10-CM | POA: Diagnosis not present

## 2020-12-17 DIAGNOSIS — I951 Orthostatic hypotension: Secondary | ICD-10-CM | POA: Diagnosis not present

## 2020-12-17 DIAGNOSIS — E1151 Type 2 diabetes mellitus with diabetic peripheral angiopathy without gangrene: Secondary | ICD-10-CM | POA: Diagnosis not present

## 2020-12-17 DIAGNOSIS — M159 Polyosteoarthritis, unspecified: Secondary | ICD-10-CM | POA: Diagnosis not present

## 2020-12-17 DIAGNOSIS — K219 Gastro-esophageal reflux disease without esophagitis: Secondary | ICD-10-CM | POA: Diagnosis not present

## 2020-12-17 DIAGNOSIS — I69398 Other sequelae of cerebral infarction: Secondary | ICD-10-CM | POA: Diagnosis not present

## 2020-12-17 DIAGNOSIS — I2510 Atherosclerotic heart disease of native coronary artery without angina pectoris: Secondary | ICD-10-CM | POA: Diagnosis not present

## 2020-12-17 DIAGNOSIS — J309 Allergic rhinitis, unspecified: Secondary | ICD-10-CM | POA: Diagnosis not present

## 2020-12-17 DIAGNOSIS — M810 Age-related osteoporosis without current pathological fracture: Secondary | ICD-10-CM | POA: Diagnosis not present

## 2020-12-17 DIAGNOSIS — I700 Atherosclerosis of aorta: Secondary | ICD-10-CM | POA: Diagnosis not present

## 2020-12-18 DIAGNOSIS — R531 Weakness: Secondary | ICD-10-CM | POA: Diagnosis not present

## 2020-12-18 DIAGNOSIS — R6889 Other general symptoms and signs: Secondary | ICD-10-CM | POA: Diagnosis not present

## 2020-12-18 DIAGNOSIS — K5909 Other constipation: Secondary | ICD-10-CM | POA: Diagnosis not present

## 2020-12-18 DIAGNOSIS — H539 Unspecified visual disturbance: Secondary | ICD-10-CM | POA: Diagnosis not present

## 2020-12-18 DIAGNOSIS — Z79899 Other long term (current) drug therapy: Secondary | ICD-10-CM | POA: Diagnosis not present

## 2020-12-18 DIAGNOSIS — Z8673 Personal history of transient ischemic attack (TIA), and cerebral infarction without residual deficits: Secondary | ICD-10-CM | POA: Diagnosis not present

## 2020-12-18 DIAGNOSIS — Z1231 Encounter for screening mammogram for malignant neoplasm of breast: Secondary | ICD-10-CM | POA: Diagnosis not present

## 2020-12-18 DIAGNOSIS — E785 Hyperlipidemia, unspecified: Secondary | ICD-10-CM | POA: Diagnosis not present

## 2020-12-18 DIAGNOSIS — E1140 Type 2 diabetes mellitus with diabetic neuropathy, unspecified: Secondary | ICD-10-CM | POA: Diagnosis not present

## 2020-12-18 DIAGNOSIS — Z7409 Other reduced mobility: Secondary | ICD-10-CM | POA: Diagnosis not present

## 2020-12-18 DIAGNOSIS — Z09 Encounter for follow-up examination after completed treatment for conditions other than malignant neoplasm: Secondary | ICD-10-CM | POA: Diagnosis not present

## 2020-12-19 DIAGNOSIS — M545 Low back pain, unspecified: Secondary | ICD-10-CM | POA: Diagnosis not present

## 2020-12-19 DIAGNOSIS — Z79899 Other long term (current) drug therapy: Secondary | ICD-10-CM | POA: Diagnosis not present

## 2020-12-19 DIAGNOSIS — Z79891 Long term (current) use of opiate analgesic: Secondary | ICD-10-CM | POA: Diagnosis not present

## 2020-12-19 DIAGNOSIS — M4326 Fusion of spine, lumbar region: Secondary | ICD-10-CM | POA: Diagnosis not present

## 2020-12-19 DIAGNOSIS — G894 Chronic pain syndrome: Secondary | ICD-10-CM | POA: Diagnosis not present

## 2020-12-20 ENCOUNTER — Telehealth: Payer: Self-pay | Admitting: *Deleted

## 2020-12-20 DIAGNOSIS — H47611 Cortical blindness, right side of brain: Secondary | ICD-10-CM | POA: Diagnosis not present

## 2020-12-20 DIAGNOSIS — H47612 Cortical blindness, left side of brain: Secondary | ICD-10-CM | POA: Diagnosis not present

## 2020-12-20 DIAGNOSIS — H43823 Vitreomacular adhesion, bilateral: Secondary | ICD-10-CM | POA: Diagnosis not present

## 2020-12-20 DIAGNOSIS — H3561 Retinal hemorrhage, right eye: Secondary | ICD-10-CM | POA: Diagnosis not present

## 2020-12-20 NOTE — Telephone Encounter (Signed)
   Telephone encounter was:  Unsuccessful.  12/20/2020 Name: LEZETTE KITTS MRN: [id number] DOB: [birth year]  Unsuccessful outbound call made today to assist with:  Transportation Needs , Food Insecurity and Caregiver Stress  Outreach Attempt:  3rd Attempt.  Referral closed unable to contact patient. No answer and did not go to vooicemail  [place] Management  [phone] [address] , [place] [address] Email : [email or web address]

## 2020-12-20 NOTE — Telephone Encounter (Signed)
   Telephone encounter was:  Unsuccessful.  12/20/2020 Name: Barbara French MRN: [id number] DOB: 07/17/1956  Unsuccessful outbound call made today to assist with:  Transportation Needs  and Food Insecurity  Outreach Attempt:  3rd Attempt.  Referral closed unable to contact patient.  Remote access code asked for tried patient again as her case seemed emergent but will close referral and send asked for information through the mail if the patient reaches back out we are happy to help her .  [place], Care Management  [phone] [address] , [place] [address] Email : [email or web address]

## 2020-12-23 ENCOUNTER — Ambulatory Visit: Payer: Medicare Other | Admitting: *Deleted

## 2020-12-23 DIAGNOSIS — K7469 Other cirrhosis of liver: Secondary | ICD-10-CM | POA: Diagnosis not present

## 2020-12-23 DIAGNOSIS — G47419 Narcolepsy without cataplexy: Secondary | ICD-10-CM | POA: Diagnosis not present

## 2020-12-23 DIAGNOSIS — M159 Polyosteoarthritis, unspecified: Secondary | ICD-10-CM | POA: Diagnosis not present

## 2020-12-23 DIAGNOSIS — E1151 Type 2 diabetes mellitus with diabetic peripheral angiopathy without gangrene: Secondary | ICD-10-CM | POA: Diagnosis not present

## 2020-12-23 DIAGNOSIS — M47812 Spondylosis without myelopathy or radiculopathy, cervical region: Secondary | ICD-10-CM | POA: Diagnosis not present

## 2020-12-23 DIAGNOSIS — J309 Allergic rhinitis, unspecified: Secondary | ICD-10-CM | POA: Diagnosis not present

## 2020-12-23 DIAGNOSIS — I69398 Other sequelae of cerebral infarction: Secondary | ICD-10-CM | POA: Diagnosis not present

## 2020-12-23 DIAGNOSIS — J449 Chronic obstructive pulmonary disease, unspecified: Secondary | ICD-10-CM | POA: Diagnosis not present

## 2020-12-23 DIAGNOSIS — I951 Orthostatic hypotension: Secondary | ICD-10-CM | POA: Diagnosis not present

## 2020-12-23 DIAGNOSIS — K219 Gastro-esophageal reflux disease without esophagitis: Secondary | ICD-10-CM | POA: Diagnosis not present

## 2020-12-23 DIAGNOSIS — I119 Hypertensive heart disease without heart failure: Secondary | ICD-10-CM | POA: Diagnosis not present

## 2020-12-23 DIAGNOSIS — I69354 Hemiplegia and hemiparesis following cerebral infarction affecting left non-dominant side: Secondary | ICD-10-CM | POA: Diagnosis not present

## 2020-12-23 DIAGNOSIS — I2510 Atherosclerotic heart disease of native coronary artery without angina pectoris: Secondary | ICD-10-CM | POA: Diagnosis not present

## 2020-12-23 DIAGNOSIS — E871 Hypo-osmolality and hyponatremia: Secondary | ICD-10-CM | POA: Diagnosis not present

## 2020-12-23 DIAGNOSIS — I088 Other rheumatic multiple valve diseases: Secondary | ICD-10-CM | POA: Diagnosis not present

## 2020-12-23 DIAGNOSIS — I700 Atherosclerosis of aorta: Secondary | ICD-10-CM | POA: Diagnosis not present

## 2020-12-23 DIAGNOSIS — G8929 Other chronic pain: Secondary | ICD-10-CM | POA: Diagnosis not present

## 2020-12-23 DIAGNOSIS — I6523 Occlusion and stenosis of bilateral carotid arteries: Secondary | ICD-10-CM | POA: Diagnosis not present

## 2020-12-23 DIAGNOSIS — I252 Old myocardial infarction: Secondary | ICD-10-CM | POA: Diagnosis not present

## 2020-12-23 DIAGNOSIS — E7800 Pure hypercholesterolemia, unspecified: Secondary | ICD-10-CM | POA: Diagnosis not present

## 2020-12-23 DIAGNOSIS — E559 Vitamin D deficiency, unspecified: Secondary | ICD-10-CM | POA: Diagnosis not present

## 2020-12-23 DIAGNOSIS — F32A Depression, unspecified: Secondary | ICD-10-CM | POA: Diagnosis not present

## 2020-12-23 DIAGNOSIS — M810 Age-related osteoporosis without current pathological fracture: Secondary | ICD-10-CM | POA: Diagnosis not present

## 2020-12-23 DIAGNOSIS — I4891 Unspecified atrial fibrillation: Secondary | ICD-10-CM | POA: Diagnosis not present

## 2020-12-24 DIAGNOSIS — I6523 Occlusion and stenosis of bilateral carotid arteries: Secondary | ICD-10-CM | POA: Diagnosis not present

## 2020-12-25 DIAGNOSIS — I088 Other rheumatic multiple valve diseases: Secondary | ICD-10-CM | POA: Diagnosis not present

## 2020-12-25 DIAGNOSIS — M810 Age-related osteoporosis without current pathological fracture: Secondary | ICD-10-CM | POA: Diagnosis not present

## 2020-12-25 DIAGNOSIS — I951 Orthostatic hypotension: Secondary | ICD-10-CM | POA: Diagnosis not present

## 2020-12-25 DIAGNOSIS — I119 Hypertensive heart disease without heart failure: Secondary | ICD-10-CM | POA: Diagnosis not present

## 2020-12-25 DIAGNOSIS — I2510 Atherosclerotic heart disease of native coronary artery without angina pectoris: Secondary | ICD-10-CM | POA: Diagnosis not present

## 2020-12-25 DIAGNOSIS — E7800 Pure hypercholesterolemia, unspecified: Secondary | ICD-10-CM | POA: Diagnosis not present

## 2020-12-25 DIAGNOSIS — K219 Gastro-esophageal reflux disease without esophagitis: Secondary | ICD-10-CM | POA: Diagnosis not present

## 2020-12-25 DIAGNOSIS — G8929 Other chronic pain: Secondary | ICD-10-CM | POA: Diagnosis not present

## 2020-12-25 DIAGNOSIS — I4891 Unspecified atrial fibrillation: Secondary | ICD-10-CM | POA: Diagnosis not present

## 2020-12-25 DIAGNOSIS — I69354 Hemiplegia and hemiparesis following cerebral infarction affecting left non-dominant side: Secondary | ICD-10-CM | POA: Diagnosis not present

## 2020-12-25 DIAGNOSIS — E559 Vitamin D deficiency, unspecified: Secondary | ICD-10-CM | POA: Diagnosis not present

## 2020-12-25 DIAGNOSIS — I700 Atherosclerosis of aorta: Secondary | ICD-10-CM | POA: Diagnosis not present

## 2020-12-25 DIAGNOSIS — M159 Polyosteoarthritis, unspecified: Secondary | ICD-10-CM | POA: Diagnosis not present

## 2020-12-25 DIAGNOSIS — E871 Hypo-osmolality and hyponatremia: Secondary | ICD-10-CM | POA: Diagnosis not present

## 2020-12-25 DIAGNOSIS — I69398 Other sequelae of cerebral infarction: Secondary | ICD-10-CM | POA: Diagnosis not present

## 2020-12-25 DIAGNOSIS — F32A Depression, unspecified: Secondary | ICD-10-CM | POA: Diagnosis not present

## 2020-12-25 DIAGNOSIS — K7469 Other cirrhosis of liver: Secondary | ICD-10-CM | POA: Diagnosis not present

## 2020-12-25 DIAGNOSIS — E1151 Type 2 diabetes mellitus with diabetic peripheral angiopathy without gangrene: Secondary | ICD-10-CM | POA: Diagnosis not present

## 2020-12-25 DIAGNOSIS — M47812 Spondylosis without myelopathy or radiculopathy, cervical region: Secondary | ICD-10-CM | POA: Diagnosis not present

## 2020-12-25 DIAGNOSIS — G47419 Narcolepsy without cataplexy: Secondary | ICD-10-CM | POA: Diagnosis not present

## 2020-12-25 DIAGNOSIS — J449 Chronic obstructive pulmonary disease, unspecified: Secondary | ICD-10-CM | POA: Diagnosis not present

## 2020-12-25 DIAGNOSIS — I6523 Occlusion and stenosis of bilateral carotid arteries: Secondary | ICD-10-CM | POA: Diagnosis not present

## 2020-12-25 DIAGNOSIS — I252 Old myocardial infarction: Secondary | ICD-10-CM | POA: Diagnosis not present

## 2020-12-25 DIAGNOSIS — J309 Allergic rhinitis, unspecified: Secondary | ICD-10-CM | POA: Diagnosis not present

## 2020-12-30 ENCOUNTER — Other Ambulatory Visit: Payer: Self-pay | Admitting: *Deleted

## 2020-12-30 NOTE — Patient Outreach (Signed)
[place] [facility]) Care Management  12/30/2020  CARLENA RUYBAL 08-Oct-1955 [id number]   Outgoing call placed to member, no answer, unable to leave voice message.  Will follow up within the next 3-4 business days.  [name], [place], MSN [place] [phone]

## 2020-12-31 DIAGNOSIS — I119 Hypertensive heart disease without heart failure: Secondary | ICD-10-CM | POA: Diagnosis not present

## 2020-12-31 DIAGNOSIS — E871 Hypo-osmolality and hyponatremia: Secondary | ICD-10-CM | POA: Diagnosis not present

## 2020-12-31 DIAGNOSIS — K219 Gastro-esophageal reflux disease without esophagitis: Secondary | ICD-10-CM | POA: Diagnosis not present

## 2020-12-31 DIAGNOSIS — M47812 Spondylosis without myelopathy or radiculopathy, cervical region: Secondary | ICD-10-CM | POA: Diagnosis not present

## 2020-12-31 DIAGNOSIS — I252 Old myocardial infarction: Secondary | ICD-10-CM | POA: Diagnosis not present

## 2020-12-31 DIAGNOSIS — M810 Age-related osteoporosis without current pathological fracture: Secondary | ICD-10-CM | POA: Diagnosis not present

## 2020-12-31 DIAGNOSIS — I700 Atherosclerosis of aorta: Secondary | ICD-10-CM | POA: Diagnosis not present

## 2020-12-31 DIAGNOSIS — J309 Allergic rhinitis, unspecified: Secondary | ICD-10-CM | POA: Diagnosis not present

## 2020-12-31 DIAGNOSIS — E7800 Pure hypercholesterolemia, unspecified: Secondary | ICD-10-CM | POA: Diagnosis not present

## 2020-12-31 DIAGNOSIS — J449 Chronic obstructive pulmonary disease, unspecified: Secondary | ICD-10-CM | POA: Diagnosis not present

## 2020-12-31 DIAGNOSIS — E1151 Type 2 diabetes mellitus with diabetic peripheral angiopathy without gangrene: Secondary | ICD-10-CM | POA: Diagnosis not present

## 2020-12-31 DIAGNOSIS — M159 Polyosteoarthritis, unspecified: Secondary | ICD-10-CM | POA: Diagnosis not present

## 2020-12-31 DIAGNOSIS — I69354 Hemiplegia and hemiparesis following cerebral infarction affecting left non-dominant side: Secondary | ICD-10-CM | POA: Diagnosis not present

## 2020-12-31 DIAGNOSIS — I951 Orthostatic hypotension: Secondary | ICD-10-CM | POA: Diagnosis not present

## 2020-12-31 DIAGNOSIS — I088 Other rheumatic multiple valve diseases: Secondary | ICD-10-CM | POA: Diagnosis not present

## 2020-12-31 DIAGNOSIS — I69398 Other sequelae of cerebral infarction: Secondary | ICD-10-CM | POA: Diagnosis not present

## 2020-12-31 DIAGNOSIS — F32A Depression, unspecified: Secondary | ICD-10-CM | POA: Diagnosis not present

## 2020-12-31 DIAGNOSIS — G47419 Narcolepsy without cataplexy: Secondary | ICD-10-CM | POA: Diagnosis not present

## 2020-12-31 DIAGNOSIS — G8929 Other chronic pain: Secondary | ICD-10-CM | POA: Diagnosis not present

## 2020-12-31 DIAGNOSIS — K7469 Other cirrhosis of liver: Secondary | ICD-10-CM | POA: Diagnosis not present

## 2020-12-31 DIAGNOSIS — I4891 Unspecified atrial fibrillation: Secondary | ICD-10-CM | POA: Diagnosis not present

## 2020-12-31 DIAGNOSIS — I6523 Occlusion and stenosis of bilateral carotid arteries: Secondary | ICD-10-CM | POA: Diagnosis not present

## 2020-12-31 DIAGNOSIS — I2510 Atherosclerotic heart disease of native coronary artery without angina pectoris: Secondary | ICD-10-CM | POA: Diagnosis not present

## 2020-12-31 DIAGNOSIS — E559 Vitamin D deficiency, unspecified: Secondary | ICD-10-CM | POA: Diagnosis not present

## 2021-01-01 DIAGNOSIS — G8929 Other chronic pain: Secondary | ICD-10-CM | POA: Diagnosis not present

## 2021-01-01 DIAGNOSIS — I119 Hypertensive heart disease without heart failure: Secondary | ICD-10-CM | POA: Diagnosis not present

## 2021-01-01 DIAGNOSIS — I4891 Unspecified atrial fibrillation: Secondary | ICD-10-CM | POA: Diagnosis not present

## 2021-01-01 DIAGNOSIS — K7469 Other cirrhosis of liver: Secondary | ICD-10-CM | POA: Diagnosis not present

## 2021-01-01 DIAGNOSIS — I252 Old myocardial infarction: Secondary | ICD-10-CM | POA: Diagnosis not present

## 2021-01-01 DIAGNOSIS — M159 Polyosteoarthritis, unspecified: Secondary | ICD-10-CM | POA: Diagnosis not present

## 2021-01-01 DIAGNOSIS — F32A Depression, unspecified: Secondary | ICD-10-CM | POA: Diagnosis not present

## 2021-01-01 DIAGNOSIS — E7800 Pure hypercholesterolemia, unspecified: Secondary | ICD-10-CM | POA: Diagnosis not present

## 2021-01-01 DIAGNOSIS — I69354 Hemiplegia and hemiparesis following cerebral infarction affecting left non-dominant side: Secondary | ICD-10-CM | POA: Diagnosis not present

## 2021-01-01 DIAGNOSIS — E1151 Type 2 diabetes mellitus with diabetic peripheral angiopathy without gangrene: Secondary | ICD-10-CM | POA: Diagnosis not present

## 2021-01-01 DIAGNOSIS — M810 Age-related osteoporosis without current pathological fracture: Secondary | ICD-10-CM | POA: Diagnosis not present

## 2021-01-01 DIAGNOSIS — I951 Orthostatic hypotension: Secondary | ICD-10-CM | POA: Diagnosis not present

## 2021-01-01 DIAGNOSIS — I69398 Other sequelae of cerebral infarction: Secondary | ICD-10-CM | POA: Diagnosis not present

## 2021-01-01 DIAGNOSIS — M47812 Spondylosis without myelopathy or radiculopathy, cervical region: Secondary | ICD-10-CM | POA: Diagnosis not present

## 2021-01-01 DIAGNOSIS — G47419 Narcolepsy without cataplexy: Secondary | ICD-10-CM | POA: Diagnosis not present

## 2021-01-01 DIAGNOSIS — I6523 Occlusion and stenosis of bilateral carotid arteries: Secondary | ICD-10-CM | POA: Diagnosis not present

## 2021-01-01 DIAGNOSIS — I700 Atherosclerosis of aorta: Secondary | ICD-10-CM | POA: Diagnosis not present

## 2021-01-01 DIAGNOSIS — I088 Other rheumatic multiple valve diseases: Secondary | ICD-10-CM | POA: Diagnosis not present

## 2021-01-01 DIAGNOSIS — J309 Allergic rhinitis, unspecified: Secondary | ICD-10-CM | POA: Diagnosis not present

## 2021-01-01 DIAGNOSIS — I2510 Atherosclerotic heart disease of native coronary artery without angina pectoris: Secondary | ICD-10-CM | POA: Diagnosis not present

## 2021-01-01 DIAGNOSIS — E871 Hypo-osmolality and hyponatremia: Secondary | ICD-10-CM | POA: Diagnosis not present

## 2021-01-01 DIAGNOSIS — J449 Chronic obstructive pulmonary disease, unspecified: Secondary | ICD-10-CM | POA: Diagnosis not present

## 2021-01-01 DIAGNOSIS — K219 Gastro-esophageal reflux disease without esophagitis: Secondary | ICD-10-CM | POA: Diagnosis not present

## 2021-01-01 DIAGNOSIS — E559 Vitamin D deficiency, unspecified: Secondary | ICD-10-CM | POA: Diagnosis not present

## 2021-01-02 ENCOUNTER — Ambulatory Visit: Payer: Self-pay | Admitting: *Deleted

## 2021-01-02 ENCOUNTER — Other Ambulatory Visit: Payer: Medicare Other

## 2021-01-07 ENCOUNTER — Other Ambulatory Visit: Payer: Self-pay | Admitting: *Deleted

## 2021-01-07 DIAGNOSIS — I951 Orthostatic hypotension: Secondary | ICD-10-CM | POA: Diagnosis not present

## 2021-01-07 DIAGNOSIS — K7469 Other cirrhosis of liver: Secondary | ICD-10-CM | POA: Diagnosis not present

## 2021-01-07 DIAGNOSIS — I2510 Atherosclerotic heart disease of native coronary artery without angina pectoris: Secondary | ICD-10-CM | POA: Diagnosis not present

## 2021-01-07 DIAGNOSIS — F32A Depression, unspecified: Secondary | ICD-10-CM | POA: Diagnosis not present

## 2021-01-07 DIAGNOSIS — E559 Vitamin D deficiency, unspecified: Secondary | ICD-10-CM | POA: Diagnosis not present

## 2021-01-07 DIAGNOSIS — G47419 Narcolepsy without cataplexy: Secondary | ICD-10-CM | POA: Diagnosis not present

## 2021-01-07 DIAGNOSIS — I4891 Unspecified atrial fibrillation: Secondary | ICD-10-CM | POA: Diagnosis not present

## 2021-01-07 DIAGNOSIS — I69354 Hemiplegia and hemiparesis following cerebral infarction affecting left non-dominant side: Secondary | ICD-10-CM | POA: Diagnosis not present

## 2021-01-07 DIAGNOSIS — M159 Polyosteoarthritis, unspecified: Secondary | ICD-10-CM | POA: Diagnosis not present

## 2021-01-07 DIAGNOSIS — I252 Old myocardial infarction: Secondary | ICD-10-CM | POA: Diagnosis not present

## 2021-01-07 DIAGNOSIS — I69398 Other sequelae of cerebral infarction: Secondary | ICD-10-CM | POA: Diagnosis not present

## 2021-01-07 DIAGNOSIS — I088 Other rheumatic multiple valve diseases: Secondary | ICD-10-CM | POA: Diagnosis not present

## 2021-01-07 DIAGNOSIS — K219 Gastro-esophageal reflux disease without esophagitis: Secondary | ICD-10-CM | POA: Diagnosis not present

## 2021-01-07 DIAGNOSIS — E1151 Type 2 diabetes mellitus with diabetic peripheral angiopathy without gangrene: Secondary | ICD-10-CM | POA: Diagnosis not present

## 2021-01-07 DIAGNOSIS — J309 Allergic rhinitis, unspecified: Secondary | ICD-10-CM | POA: Diagnosis not present

## 2021-01-07 DIAGNOSIS — G8929 Other chronic pain: Secondary | ICD-10-CM | POA: Diagnosis not present

## 2021-01-07 DIAGNOSIS — M810 Age-related osteoporosis without current pathological fracture: Secondary | ICD-10-CM | POA: Diagnosis not present

## 2021-01-07 DIAGNOSIS — J449 Chronic obstructive pulmonary disease, unspecified: Secondary | ICD-10-CM | POA: Diagnosis not present

## 2021-01-07 DIAGNOSIS — I119 Hypertensive heart disease without heart failure: Secondary | ICD-10-CM | POA: Diagnosis not present

## 2021-01-07 DIAGNOSIS — I700 Atherosclerosis of aorta: Secondary | ICD-10-CM | POA: Diagnosis not present

## 2021-01-07 DIAGNOSIS — M47812 Spondylosis without myelopathy or radiculopathy, cervical region: Secondary | ICD-10-CM | POA: Diagnosis not present

## 2021-01-07 DIAGNOSIS — E871 Hypo-osmolality and hyponatremia: Secondary | ICD-10-CM | POA: Diagnosis not present

## 2021-01-07 DIAGNOSIS — E7800 Pure hypercholesterolemia, unspecified: Secondary | ICD-10-CM | POA: Diagnosis not present

## 2021-01-07 DIAGNOSIS — I6523 Occlusion and stenosis of bilateral carotid arteries: Secondary | ICD-10-CM | POA: Diagnosis not present

## 2021-01-07 NOTE — Patient Outreach (Signed)
[place] [facility]) Care Management  01/07/2021  Barbara French 1956/01/29 [id number]   Outreach attempt #2, unsuccessful, unable to leave voice message.  Will send outreach letter and follow up within the next 3-4 business days.  [name], [place], MSN [place] [phone]

## 2021-01-08 DIAGNOSIS — I69354 Hemiplegia and hemiparesis following cerebral infarction affecting left non-dominant side: Secondary | ICD-10-CM | POA: Diagnosis not present

## 2021-01-08 DIAGNOSIS — I119 Hypertensive heart disease without heart failure: Secondary | ICD-10-CM | POA: Diagnosis not present

## 2021-01-08 DIAGNOSIS — M159 Polyosteoarthritis, unspecified: Secondary | ICD-10-CM | POA: Diagnosis not present

## 2021-01-08 DIAGNOSIS — I4891 Unspecified atrial fibrillation: Secondary | ICD-10-CM | POA: Diagnosis not present

## 2021-01-08 DIAGNOSIS — I252 Old myocardial infarction: Secondary | ICD-10-CM | POA: Diagnosis not present

## 2021-01-08 DIAGNOSIS — E871 Hypo-osmolality and hyponatremia: Secondary | ICD-10-CM | POA: Diagnosis not present

## 2021-01-08 DIAGNOSIS — J309 Allergic rhinitis, unspecified: Secondary | ICD-10-CM | POA: Diagnosis not present

## 2021-01-08 DIAGNOSIS — M810 Age-related osteoporosis without current pathological fracture: Secondary | ICD-10-CM | POA: Diagnosis not present

## 2021-01-08 DIAGNOSIS — J449 Chronic obstructive pulmonary disease, unspecified: Secondary | ICD-10-CM | POA: Diagnosis not present

## 2021-01-08 DIAGNOSIS — G8929 Other chronic pain: Secondary | ICD-10-CM | POA: Diagnosis not present

## 2021-01-08 DIAGNOSIS — I69398 Other sequelae of cerebral infarction: Secondary | ICD-10-CM | POA: Diagnosis not present

## 2021-01-08 DIAGNOSIS — G47419 Narcolepsy without cataplexy: Secondary | ICD-10-CM | POA: Diagnosis not present

## 2021-01-08 DIAGNOSIS — I6523 Occlusion and stenosis of bilateral carotid arteries: Secondary | ICD-10-CM | POA: Diagnosis not present

## 2021-01-08 DIAGNOSIS — I951 Orthostatic hypotension: Secondary | ICD-10-CM | POA: Diagnosis not present

## 2021-01-08 DIAGNOSIS — E7800 Pure hypercholesterolemia, unspecified: Secondary | ICD-10-CM | POA: Diagnosis not present

## 2021-01-08 DIAGNOSIS — I088 Other rheumatic multiple valve diseases: Secondary | ICD-10-CM | POA: Diagnosis not present

## 2021-01-08 DIAGNOSIS — I700 Atherosclerosis of aorta: Secondary | ICD-10-CM | POA: Diagnosis not present

## 2021-01-08 DIAGNOSIS — M47812 Spondylosis without myelopathy or radiculopathy, cervical region: Secondary | ICD-10-CM | POA: Diagnosis not present

## 2021-01-08 DIAGNOSIS — I2510 Atherosclerotic heart disease of native coronary artery without angina pectoris: Secondary | ICD-10-CM | POA: Diagnosis not present

## 2021-01-08 DIAGNOSIS — K7469 Other cirrhosis of liver: Secondary | ICD-10-CM | POA: Diagnosis not present

## 2021-01-08 DIAGNOSIS — F32A Depression, unspecified: Secondary | ICD-10-CM | POA: Diagnosis not present

## 2021-01-08 DIAGNOSIS — K219 Gastro-esophageal reflux disease without esophagitis: Secondary | ICD-10-CM | POA: Diagnosis not present

## 2021-01-08 DIAGNOSIS — E559 Vitamin D deficiency, unspecified: Secondary | ICD-10-CM | POA: Diagnosis not present

## 2021-01-08 DIAGNOSIS — E1151 Type 2 diabetes mellitus with diabetic peripheral angiopathy without gangrene: Secondary | ICD-10-CM | POA: Diagnosis not present

## 2021-01-13 ENCOUNTER — Other Ambulatory Visit: Payer: Self-pay | Admitting: *Deleted

## 2021-01-13 DIAGNOSIS — G894 Chronic pain syndrome: Secondary | ICD-10-CM | POA: Diagnosis not present

## 2021-01-13 DIAGNOSIS — M545 Low back pain, unspecified: Secondary | ICD-10-CM | POA: Diagnosis not present

## 2021-01-13 DIAGNOSIS — M4326 Fusion of spine, lumbar region: Secondary | ICD-10-CM | POA: Diagnosis not present

## 2021-01-13 NOTE — Patient Outreach (Signed)
[place] [facility]) Care Management  01/13/2021  Barbara French 1956/04/11 [id number]   Outreach attempt #3, unsuccessful, unable to leave voice message.  Will make 4th and final attempt within the next 4 weeks.  If remain unsuccessful, will close case due to inability to maintain contact.  [name], [place], MSN [place] [phone]

## 2021-01-23 DIAGNOSIS — J4520 Mild intermittent asthma, uncomplicated: Secondary | ICD-10-CM | POA: Diagnosis not present

## 2021-01-23 DIAGNOSIS — H3561 Retinal hemorrhage, right eye: Secondary | ICD-10-CM | POA: Diagnosis not present

## 2021-01-23 DIAGNOSIS — R5383 Other fatigue: Secondary | ICD-10-CM | POA: Diagnosis not present

## 2021-01-23 DIAGNOSIS — H47612 Cortical blindness, left side of brain: Secondary | ICD-10-CM | POA: Diagnosis not present

## 2021-01-23 DIAGNOSIS — G4733 Obstructive sleep apnea (adult) (pediatric): Secondary | ICD-10-CM | POA: Diagnosis not present

## 2021-01-23 DIAGNOSIS — H43823 Vitreomacular adhesion, bilateral: Secondary | ICD-10-CM | POA: Diagnosis not present

## 2021-01-23 DIAGNOSIS — R911 Solitary pulmonary nodule: Secondary | ICD-10-CM | POA: Diagnosis not present

## 2021-01-23 DIAGNOSIS — H35033 Hypertensive retinopathy, bilateral: Secondary | ICD-10-CM | POA: Diagnosis not present

## 2021-01-23 DIAGNOSIS — H47611 Cortical blindness, right side of brain: Secondary | ICD-10-CM | POA: Diagnosis not present

## 2021-02-05 ENCOUNTER — Other Ambulatory Visit: Payer: Self-pay | Admitting: *Deleted

## 2021-02-05 NOTE — Patient Outreach (Signed)
[facility]) Care Management  02/05/2021  Barbara French February 12, 1956 [id number]   Outreach attempt #4, successful.  Member is doing well, questions answered about medications (particularly blood thinner), will have daughter review again when she is off work.  Denies any urgent concerns, encouraged to contact this care manager with questions.  Agrees to follow up within the next month.   Goals Addressed             This Visit's Progress    [facility] - Find Help in My Community   On track    Timeframe:  Short-Term Goal Priority:  Medium Start Date:       5/2                      Expected End Date:    7/29 (extended)               Barriers: Support System Visual  Follow Up Date 5/16   - follow-up on any referrals for help I am given    Why is this important?   Knowing how and where to find help for yourself or family in your neighborhood and community is an important skill.  You will want to take some steps to learn how.    Notes:   5/2 - Referral placed to Care Guide for food resources and guidance with Medicaid application process  3/15 - Advised member that [place] had made multiple attempts to contact her.  Provided with contact information, encouraged to call as soon as possible.      [facility] - Make and Keep All Appointments   On track    Timeframe:  Short-Term Goal Priority:  High Start Date:             5/2                Expected End Date:  7/29 (extended)  Barriers: Transportation Visual                      Follow Up Date 5/16   - ask family or friend for a ride - call to cancel if needed    Why is this important?   Part of staying healthy is seeing the doctor for follow-up care.  If you forget your appointments, there are some things you can do to stay on track.    Notes:   5/2 - Advised of [employer] transportation resources.  Encouraged to ask daughter would this be ok if she is unable to take her to appointments. Conference calls placed to  Vascular (appointment changed to 5/16) and Neuro (they will call her back for appointment).  Member will call PCP and Cardiology herself with the help of her daughter (both providers currently out of the country).  6/29 - Has follow up appointments tomorrow for carotid ultrasound as well as with IR.  State daughter will provide transportation      [facility] - Plan for Long Term Care-Stroke   On track    Timeframe:  Long-Range Goal Priority:  Medium Start Date:         5/2                    Expected End Date:    8/2  Barriers: Knowledge Support System Visual                      - check out other  places when staying at home is no longer possible (assisted living center, nursing home) - check out services like in-home help or adult day care - list the symptoms that would make staying at home too hard    Why is this important?   Having and recovering from a stroke can be scary and stressful.  You can reduce stress by planning.  Preparing for the future is one of the most important things to do.  Thinking about how much care you/your loved one will need and how much it will cost is not easy.  You/your loved one can be part of making decisions for the future.  Making sure that your/your loved one's wishes for care are known is important.     Notes:   5/2 - Discussed Medicaid application and PCS process.  Referral to Care Guide for medicaid guidance.    6/29 - Report she continues to recover from stroke, has daughters and grandchildren helping to recovery process.        [name], [place], MSN [place] [phone]

## 2021-02-06 ENCOUNTER — Ambulatory Visit
Admission: RE | Admit: 2021-02-06 | Discharge: 2021-02-06 | Disposition: A | Discharge status: Home / Self Care | Payer: Medicare Other | Source: Ambulatory Visit | Attending: Interventional Radiology | Admitting: Interventional Radiology

## 2021-02-06 ENCOUNTER — Encounter: Payer: Self-pay | Admitting: *Deleted

## 2021-02-06 ENCOUNTER — Other Ambulatory Visit: Payer: Self-pay

## 2021-02-06 DIAGNOSIS — R911 Solitary pulmonary nodule: Secondary | ICD-10-CM | POA: Diagnosis not present

## 2021-02-06 DIAGNOSIS — Z8673 Personal history of transient ischemic attack (TIA), and cerebral infarction without residual deficits: Secondary | ICD-10-CM

## 2021-02-06 DIAGNOSIS — I63231 Cerebral infarction due to unspecified occlusion or stenosis of right carotid arteries: Secondary | ICD-10-CM | POA: Diagnosis not present

## 2021-02-06 DIAGNOSIS — Z9889 Other specified postprocedural states: Secondary | ICD-10-CM | POA: Diagnosis not present

## 2021-02-06 DIAGNOSIS — I6523 Occlusion and stenosis of bilateral carotid arteries: Secondary | ICD-10-CM | POA: Diagnosis not present

## 2021-02-06 DIAGNOSIS — R5383 Other fatigue: Secondary | ICD-10-CM | POA: Diagnosis not present

## 2021-02-06 DIAGNOSIS — G4733 Obstructive sleep apnea (adult) (pediatric): Secondary | ICD-10-CM | POA: Diagnosis not present

## 2021-02-06 DIAGNOSIS — Z7902 Long term (current) use of antithrombotics/antiplatelets: Secondary | ICD-10-CM | POA: Diagnosis not present

## 2021-02-06 DIAGNOSIS — J4520 Mild intermittent asthma, uncomplicated: Secondary | ICD-10-CM | POA: Diagnosis not present

## 2021-02-06 HISTORY — PX: IR RADIOLOGIST EVAL & MGMT: IMG5224
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
## 2021-02-07 ENCOUNTER — Telehealth: Payer: Self-pay | Admitting: *Deleted

## 2021-02-07 NOTE — Telephone Encounter (Signed)
   Telephone encounter was:  Successful.  02/07/2021 Name: Barbara French MRN: [id number] DOB: 1955/10/22  Barbara French is a 65 y.o. year old female who is a primary care patient of Uppin, Gae Bon, MD . The community resource team was consulted for assistance with  medicaid   Care guide performed the following interventions: Patient provided with information about care guide support team and interviewed to confirm resource needs.  Follow Up Plan:  No further follow up planned at this time. The patient has been provided with needed resources. [place], Care Management  [phone] [address] , [place] [address] Email : [email or web address]

## 2021-02-17 DIAGNOSIS — G894 Chronic pain syndrome: Secondary | ICD-10-CM | POA: Diagnosis not present

## 2021-02-17 DIAGNOSIS — M545 Low back pain, unspecified: Secondary | ICD-10-CM | POA: Diagnosis not present

## 2021-02-17 DIAGNOSIS — M4326 Fusion of spine, lumbar region: Secondary | ICD-10-CM | POA: Diagnosis not present

## 2021-02-20 DIAGNOSIS — W19XXXA Unspecified fall, initial encounter: Secondary | ICD-10-CM | POA: Diagnosis not present

## 2021-02-20 DIAGNOSIS — H5203 Hypermetropia, bilateral: Secondary | ICD-10-CM | POA: Diagnosis not present

## 2021-02-20 DIAGNOSIS — H2513 Age-related nuclear cataract, bilateral: Secondary | ICD-10-CM | POA: Diagnosis not present

## 2021-02-20 DIAGNOSIS — S8002XA Contusion of left knee, initial encounter: Secondary | ICD-10-CM | POA: Diagnosis not present

## 2021-02-20 DIAGNOSIS — M25561 Pain in right knee: Secondary | ICD-10-CM | POA: Diagnosis not present

## 2021-02-20 DIAGNOSIS — H35443 Age-related reticular degeneration of retina, bilateral: Secondary | ICD-10-CM | POA: Diagnosis not present

## 2021-02-20 DIAGNOSIS — M25562 Pain in left knee: Secondary | ICD-10-CM | POA: Diagnosis not present

## 2021-02-20 DIAGNOSIS — Z7984 Long term (current) use of oral hypoglycemic drugs: Secondary | ICD-10-CM | POA: Diagnosis not present

## 2021-02-20 DIAGNOSIS — E113393 Type 2 diabetes mellitus with moderate nonproliferative diabetic retinopathy without macular edema, bilateral: Secondary | ICD-10-CM | POA: Diagnosis not present

## 2021-02-20 DIAGNOSIS — M1712 Unilateral primary osteoarthritis, left knee: Secondary | ICD-10-CM | POA: Diagnosis not present

## 2021-02-20 DIAGNOSIS — Z96651 Presence of right artificial knee joint: Secondary | ICD-10-CM | POA: Diagnosis not present

## 2021-02-24 DIAGNOSIS — R911 Solitary pulmonary nodule: Secondary | ICD-10-CM | POA: Diagnosis not present

## 2021-02-24 DIAGNOSIS — I700 Atherosclerosis of aorta: Secondary | ICD-10-CM | POA: Diagnosis not present

## 2021-02-24 DIAGNOSIS — R918 Other nonspecific abnormal finding of lung field: Secondary | ICD-10-CM | POA: Diagnosis not present

## 2021-03-03 ENCOUNTER — Other Ambulatory Visit: Payer: Self-pay | Admitting: *Deleted

## 2021-03-03 NOTE — Patient Outreach (Signed)
[facility]) Care Management  03/03/2021  ORI KREITER [birth year] [id number]   Outgoing call placed to member.  State she is "alright."  Takes "forever" to get dressed, report she is needing more help, family still helpful but not consistent.  State she was supposed to get Medicaid application but report not receiving.  Daughter has mentioned completing paperwork, but member unable to remember for certain.  Daughter currently at work, unavailable for questions.  Member will follow up with daughter, if not completed will send forms again.  Denies any urgent concerns, encouraged to contact this care manager with questions.  Agrees to follow up within the next month.   Goals Addressed             This Visit's Progress    [facility] - Find Help in My Community   On track    Timeframe:  Short-Term Goal Priority:  Medium Start Date:       5/2                      Expected End Date:    7/29 (extended)               Barriers: Support System Visual  Follow Up Date 5/16   - follow-up on any referrals for help I am given    Why is this important?   Knowing how and where to find help for yourself or family in your neighborhood and community is an important skill.  You will want to take some steps to learn how.    Notes:   5/2 - Referral placed to Care Guide for food resources and guidance with Medicaid application process  9/37 - Advised member that [place] had made multiple attempts to contact her.  Provided with contact information, encouraged to call as soon as possible.  7/25 - Confirms she has been in contact with care guide and should have received Medicaid application.  Encouraged to check with daughter about application and submittal     [facility] - Make and Keep All Appointments   On track    Timeframe:  Short-Term Goal Priority:  High Start Date:             5/2                Expected End Date:  7/29 (extended)  Barriers: Transportation Visual                       Follow Up Date 5/16   - ask family or friend for a ride - call to cancel if needed    Why is this important?   Part of staying healthy is seeing the doctor for follow-up care.  If you forget your appointments, there are some things you can do to stay on track.    Notes:   5/2 - Advised of [employer] transportation resources.  Encouraged to ask daughter would this be ok if she is unable to take her to appointments. Conference calls placed to Vascular (appointment changed to 5/16) and Neuro (they will call her back for appointment).  Member will call PCP and Cardiology herself with the help of her daughter (both providers currently out of the country).  6/29 - Has follow up appointments tomorrow for carotid ultrasound as well as with IR.  State daughter will provide transportation  7/25 - Has follow up appointment with ortho on 8/4 for ongoing joint and back pain.  Also will see neuro on 9/15     [facility] - Plan for Long Term Care-Stroke   On track    Timeframe:  Long-Range Goal Priority:  Medium Start Date:         5/2                    Expected End Date:    8/2  Barriers: Knowledge Support System Visual                      - check out other places when staying at home is no longer possible (assisted living center, nursing home) - check out services like in-home help or adult day care - list the symptoms that would make staying at home too hard    Why is this important?   Having and recovering from a stroke can be scary and stressful.  You can reduce stress by planning.  Preparing for the future is one of the most important things to do.  Thinking about how much care you/your loved one will need and how much it will cost is not easy.  You/your loved one can be part of making decisions for the future.  Making sure that your/your loved one's wishes for care are known is important.     Notes:   5/2 - Discussed Medicaid application and PCS process.  Referral to Care Guide for  medicaid guidance.    6/29 - Report she continues to recover from stroke, has daughters and grandchildren helping to recovery process.  7/25 - Although she report having some help from family, feels she is now in need of additional support for bathing and dressing. Advised that until Medicaid has been approved, this will be an out of pocket expense.  Verbalizes understanding, does not have funds to pay out of pocket, will continue to solicit help from family       [name], Therapist, sports, MSN [place] Manager [phone]

## 2021-03-05 ENCOUNTER — Ambulatory Visit: Payer: Self-pay | Admitting: *Deleted

## 2021-03-05 DIAGNOSIS — G4733 Obstructive sleep apnea (adult) (pediatric): Secondary | ICD-10-CM | POA: Diagnosis not present

## 2021-03-05 DIAGNOSIS — R911 Solitary pulmonary nodule: Secondary | ICD-10-CM | POA: Diagnosis not present

## 2021-03-05 DIAGNOSIS — J4520 Mild intermittent asthma, uncomplicated: Secondary | ICD-10-CM | POA: Diagnosis not present

## 2021-03-05 DIAGNOSIS — R5383 Other fatigue: Secondary | ICD-10-CM | POA: Diagnosis not present

## 2021-03-13 DIAGNOSIS — R42 Dizziness and giddiness: Secondary | ICD-10-CM | POA: Diagnosis not present

## 2021-03-13 DIAGNOSIS — Z09 Encounter for follow-up examination after completed treatment for conditions other than malignant neoplasm: Secondary | ICD-10-CM | POA: Diagnosis not present

## 2021-03-13 DIAGNOSIS — R918 Other nonspecific abnormal finding of lung field: Secondary | ICD-10-CM | POA: Diagnosis not present

## 2021-03-13 DIAGNOSIS — U071 COVID-19: Secondary | ICD-10-CM | POA: Diagnosis not present

## 2021-03-13 DIAGNOSIS — E1169 Type 2 diabetes mellitus with other specified complication: Secondary | ICD-10-CM | POA: Diagnosis not present

## 2021-03-18 DIAGNOSIS — G894 Chronic pain syndrome: Secondary | ICD-10-CM | POA: Diagnosis not present

## 2021-03-18 DIAGNOSIS — M4326 Fusion of spine, lumbar region: Secondary | ICD-10-CM | POA: Diagnosis not present

## 2021-03-18 DIAGNOSIS — M545 Low back pain, unspecified: Secondary | ICD-10-CM | POA: Diagnosis not present

## 2021-03-26 DIAGNOSIS — I361 Nonrheumatic tricuspid (valve) insufficiency: Secondary | ICD-10-CM | POA: Diagnosis not present

## 2021-03-26 DIAGNOSIS — E119 Type 2 diabetes mellitus without complications: Secondary | ICD-10-CM | POA: Diagnosis not present

## 2021-03-26 DIAGNOSIS — G8929 Other chronic pain: Secondary | ICD-10-CM | POA: Diagnosis not present

## 2021-03-26 DIAGNOSIS — R911 Solitary pulmonary nodule: Secondary | ICD-10-CM | POA: Diagnosis not present

## 2021-03-26 DIAGNOSIS — Z8673 Personal history of transient ischemic attack (TIA), and cerebral infarction without residual deficits: Secondary | ICD-10-CM | POA: Diagnosis not present

## 2021-03-26 DIAGNOSIS — Z7982 Long term (current) use of aspirin: Secondary | ICD-10-CM | POA: Diagnosis not present

## 2021-03-26 DIAGNOSIS — R0602 Shortness of breath: Secondary | ICD-10-CM | POA: Diagnosis not present

## 2021-03-26 DIAGNOSIS — I214 Non-ST elevation (NSTEMI) myocardial infarction: Secondary | ICD-10-CM | POA: Diagnosis not present

## 2021-03-26 DIAGNOSIS — I700 Atherosclerosis of aorta: Secondary | ICD-10-CM | POA: Diagnosis not present

## 2021-03-26 DIAGNOSIS — R519 Headache, unspecified: Secondary | ICD-10-CM | POA: Diagnosis not present

## 2021-03-26 DIAGNOSIS — Z86711 Personal history of pulmonary embolism: Secondary | ICD-10-CM | POA: Diagnosis not present

## 2021-03-26 DIAGNOSIS — Z9119 Patient's noncompliance with other medical treatment and regimen: Secondary | ICD-10-CM | POA: Diagnosis not present

## 2021-03-26 DIAGNOSIS — E1151 Type 2 diabetes mellitus with diabetic peripheral angiopathy without gangrene: Secondary | ICD-10-CM | POA: Diagnosis not present

## 2021-03-26 DIAGNOSIS — R42 Dizziness and giddiness: Secondary | ICD-10-CM | POA: Diagnosis not present

## 2021-03-26 DIAGNOSIS — J440 Chronic obstructive pulmonary disease with acute lower respiratory infection: Secondary | ICD-10-CM | POA: Diagnosis not present

## 2021-03-26 DIAGNOSIS — J449 Chronic obstructive pulmonary disease, unspecified: Secondary | ICD-10-CM | POA: Diagnosis not present

## 2021-03-26 DIAGNOSIS — R509 Fever, unspecified: Secondary | ICD-10-CM | POA: Diagnosis not present

## 2021-03-26 DIAGNOSIS — U071 COVID-19: Secondary | ICD-10-CM | POA: Diagnosis not present

## 2021-03-26 DIAGNOSIS — J9621 Acute and chronic respiratory failure with hypoxia: Secondary | ICD-10-CM | POA: Diagnosis not present

## 2021-03-26 DIAGNOSIS — R918 Other nonspecific abnormal finding of lung field: Secondary | ICD-10-CM | POA: Diagnosis not present

## 2021-03-26 DIAGNOSIS — E039 Hypothyroidism, unspecified: Secondary | ICD-10-CM | POA: Diagnosis not present

## 2021-03-26 DIAGNOSIS — K219 Gastro-esophageal reflux disease without esophagitis: Secondary | ICD-10-CM | POA: Diagnosis not present

## 2021-03-26 DIAGNOSIS — I517 Cardiomegaly: Secondary | ICD-10-CM | POA: Diagnosis not present

## 2021-03-26 DIAGNOSIS — Z7902 Long term (current) use of antithrombotics/antiplatelets: Secondary | ICD-10-CM | POA: Diagnosis not present

## 2021-03-26 DIAGNOSIS — I9589 Other hypotension: Secondary | ICD-10-CM | POA: Diagnosis not present

## 2021-03-26 DIAGNOSIS — F17210 Nicotine dependence, cigarettes, uncomplicated: Secondary | ICD-10-CM | POA: Diagnosis not present

## 2021-03-26 DIAGNOSIS — R778 Other specified abnormalities of plasma proteins: Secondary | ICD-10-CM | POA: Diagnosis not present

## 2021-03-26 DIAGNOSIS — I10 Essential (primary) hypertension: Secondary | ICD-10-CM | POA: Diagnosis not present

## 2021-03-26 DIAGNOSIS — I2510 Atherosclerotic heart disease of native coronary artery without angina pectoris: Secondary | ICD-10-CM | POA: Diagnosis not present

## 2021-03-26 DIAGNOSIS — E785 Hyperlipidemia, unspecified: Secondary | ICD-10-CM | POA: Diagnosis not present

## 2021-03-26 DIAGNOSIS — I371 Nonrheumatic pulmonary valve insufficiency: Secondary | ICD-10-CM | POA: Diagnosis not present

## 2021-03-26 DIAGNOSIS — E875 Hyperkalemia: Secondary | ICD-10-CM | POA: Diagnosis not present

## 2021-03-26 DIAGNOSIS — I21A1 Myocardial infarction type 2: Secondary | ICD-10-CM | POA: Diagnosis not present

## 2021-03-26 DIAGNOSIS — J1282 Pneumonia due to coronavirus disease 2019: Secondary | ICD-10-CM | POA: Diagnosis not present

## 2021-03-26 DIAGNOSIS — I272 Pulmonary hypertension, unspecified: Secondary | ICD-10-CM | POA: Diagnosis not present

## 2021-03-26 DIAGNOSIS — E871 Hypo-osmolality and hyponatremia: Secondary | ICD-10-CM | POA: Diagnosis not present

## 2021-03-26 DIAGNOSIS — J9601 Acute respiratory failure with hypoxia: Secondary | ICD-10-CM | POA: Diagnosis not present

## 2021-03-26 DIAGNOSIS — E559 Vitamin D deficiency, unspecified: Secondary | ICD-10-CM | POA: Diagnosis not present

## 2021-03-27 DIAGNOSIS — U071 COVID-19: Secondary | ICD-10-CM

## 2021-03-27 DIAGNOSIS — J1282 Pneumonia due to coronavirus disease 2019: Secondary | ICD-10-CM

## 2021-03-27 HISTORY — DX: COVID-19: U07.1

## 2021-03-27 HISTORY — DX: Pneumonia due to coronavirus disease 2019: J12.82

## 2021-03-31 DIAGNOSIS — E871 Hypo-osmolality and hyponatremia: Secondary | ICD-10-CM | POA: Diagnosis not present

## 2021-03-31 DIAGNOSIS — U071 COVID-19: Secondary | ICD-10-CM | POA: Diagnosis not present

## 2021-03-31 DIAGNOSIS — R918 Other nonspecific abnormal finding of lung field: Secondary | ICD-10-CM | POA: Diagnosis not present

## 2021-03-31 DIAGNOSIS — J9611 Chronic respiratory failure with hypoxia: Secondary | ICD-10-CM | POA: Diagnosis not present

## 2021-03-31 DIAGNOSIS — Z9981 Dependence on supplemental oxygen: Secondary | ICD-10-CM | POA: Diagnosis not present

## 2021-03-31 DIAGNOSIS — J1282 Pneumonia due to coronavirus disease 2019: Secondary | ICD-10-CM | POA: Diagnosis not present

## 2021-04-01 ENCOUNTER — Other Ambulatory Visit: Payer: Self-pay | Admitting: *Deleted

## 2021-04-01 NOTE — Patient Outreach (Addendum)
[facility]) Care Management  04/01/2021  Barbara French [birth year] [id number]   Outgoing call placed to member, no answer, HIPAA compliant voice message left.  Noted that she was admitted to [facility] from 8/18-8/21 for Covid related complications.  Will follow up within the next 3-4 business days.  [name], [place], MSN [place] [phone]

## 2021-04-04 ENCOUNTER — Other Ambulatory Visit: Payer: Self-pay | Admitting: *Deleted

## 2021-04-04 NOTE — Patient Outreach (Signed)
[facility]) Care Management  [place]  04/05/2021   Barbara French 02-Jan-1956 [id number]   Outreach attempt #2, successful.  State she is doing better since being admitted for Covid.  Has virtual visit today, concerned that she is not able to see pain management MD until she has tested negative.  Encouraged to inquire about virtual visit.  Hasn't checked blood sugar or blood pressure, state she lost her meter.  Will ask PCP for new one.  Denies any urgent concerns, encouraged to contact this care manager with questions.    Encounter Medications:  Outpatient Encounter Medications as of 04/04/2021  Medication Sig Note   acetaminophen (TYLENOL) 500 MG tablet Take 1,000 mg by mouth 4 (four) times daily as needed for moderate pain or headache.    albuterol (VENTOLIN HFA) 108 (90 Base) MCG/ACT inhaler Inhale 1-2 puffs into the lungs 4 (four) times daily as needed for wheezing or shortness of breath.    amLODipine (NORVASC) 5 MG tablet Take 1 tablet by mouth every morning.    amoxicillin-clavulanate (AUGMENTIN) 875-125 MG tablet Take 1 tablet by mouth 2 (two) times daily. 12/02/2020: 7 day course started 11/28/2020   amphetamine-dextroamphetamine (ADDERALL) 15 MG tablet Take 15 mg by mouth daily as needed (when needs to be alert/awake; for MD appointments). 12/02/2020: #60/30 days filled 10/03/2020 [facility] per PMP AWARE   apixaban (ELIQUIS) 5 MG TABS tablet Take 5 mg by mouth 2 (two) times daily. 12/02/2020: On hold due to procedure scheduled for 12/04/2020   atorvastatin (LIPITOR) 40 MG tablet Take 40 mg by mouth every evening.    cilostazol (PLETAL) 50 MG tablet Take 100 mg by mouth 2 (two) times daily.    cyclobenzaprine (FLEXERIL) 10 MG tablet Take 10 mg by mouth 3 (three) times daily.    EPINEPHrine 0.3 mg/0.3 mL IJ SOAJ injection Inject 0.3 mg into the muscle once as needed for anaphylaxis.    escitalopram (LEXAPRO) 20 MG tablet Take 20 mg by mouth every morning.     gabapentin (NEURONTIN) 300 MG capsule Take 300 mg by mouth 3 (three) times daily.    HYDROmorphone (DILAUDID) 4 MG tablet Take 4 mg by mouth 4 (four) times daily as needed (pain). 12/02/2020: #105/26 filled 11/13/2020 [facility] per PMP AWARE   losartan (COZAAR) 50 MG tablet Take 50 mg by mouth every morning.    Melatonin 5 MG CHEW Chew 10 mg by mouth at bedtime.    metFORMIN (GLUCOPHAGE) 500 MG tablet Take 500 mg by mouth 2 (two) times daily with a meal.    nitroGLYCERIN (NITROSTAT) 0.4 MG SL tablet Place 1 tablet (0.4 mg total) under the tongue every 5 (five) minutes as needed for chest pain. 12/02/2020: Pt took 2 tablets one day last week   pantoprazole (PROTONIX) 40 MG tablet Take 40 mg by mouth every morning.    potassium chloride (KLOR-CON) 10 MEQ tablet Take 10 mEq by mouth every morning.    sitaGLIPtin (JANUVIA) 100 MG tablet Take 100 mg by mouth every morning.    No facility-administered encounter medications on file as of 04/04/2021.    Functional Status:  In your present state of health, do you have any difficulty performing the following activities: 12/05/2020 12/02/2020  Hearing? N N  Vision? N Y  Difficulty concentrating or making decisions? N N  Walking or climbing stairs? - N  Dressing or bathing? - N  Doing errands, shopping? - Y  Some recent data might be hidden  Fall/Depression Screening: Fall Risk  02/05/2021  Falls in the past year? 0  Number falls in past yr: 0  Injury with Fall? 0   PHQ 2/9 Scores 12/09/2020  PHQ - 2 Score 1    Assessment:   Care Plan Care Plan : General Plan of Care (Adult)  Updates made by [name], RN since 04/05/2021 12:00 AM     Problem: Concern for safety due to recent fall   Priority: High     Long-Range Goal: Patient will not have any falls in the next 3 months   Start Date: 04/04/2021  Expected End Date: 07/04/2021  Priority: High  Note:   Current Barriers:  Knowledge Deficits related to plan of care for management of HTN,  DMII, and fall  Care Coordination needs related to ADL IADL limitations   RNCM Clinical Goal(s):  Patient will verbalize understanding of plan for management of HTN, DMII, and fall attend all scheduled medical appointments: PCP and Neuro work with community resource care guide to address needs related to ADL IADL limitations Medicaid process  through collaboration with Consulting civil engineer, provider, and care team.   Interventions: 1:1 collaboration with primary care provider regarding development and update of comprehensive plan of care as evidenced by provider attestation and co-signature Inter-disciplinary care team collaboration (see longitudinal plan of care) Evaluation of current treatment plan related to  self management and patient's adherence to plan as established by provider   Falls:  (Status: New goal.) Reviewed medications and discussed potential side effects of medications such as dizziness and frequent urination Assessed for falls since last encounter Provided patient information for fall alert systems Screening for signs and symptoms of depression related to chronic disease state  Patient Goals/Self-Care Activities: Patient will attend all scheduled provider appointments Patient will continue to perform ADL's independently        Goals Addressed             This Visit's Progress    THN - Find Help in My Community   Not on track    Timeframe:  Short-Term Goal Priority:  Medium Start Date:       5/2                      Expected End Date:    11/2 (extended)               Barriers: Support System Visual    - follow-up on any referrals for help I am given    Why is this important?   Knowing how and where to find help for yourself or family in your neighborhood and community is an important skill.  You will want to take some steps to learn how.    Notes:   5/2 - Referral placed to Care Guide for food resources and guidance with Medicaid application  process  6/23 - Advised member that [place] had made multiple attempts to contact her.  Provided with contact information, encouraged to call as soon as possible.  7/25 - Confirms she has been in contact with care guide and should have received Medicaid application.  Encouraged to check with daughter about application and submittal  8/26 - Report she still has not received Medicaid information, will have Care guide resend     COMPLETED: THN - Make and Keep All Appointments   On track    Timeframe:  Short-Term Goal Priority:  High Start Date:  5/2                Expected End Date:  7/29 (extended)  Barriers: Transportation Visual                      Follow Up Date 5/16   - ask family or friend for a ride - call to cancel if needed    Why is this important?   Part of staying healthy is seeing the doctor for follow-up care.  If you forget your appointments, there are some things you can do to stay on track.    Notes:   5/2 - Advised of [employer] transportation resources.  Encouraged to ask daughter would this be ok if she is unable to take her to appointments. Conference calls placed to Vascular (appointment changed to 5/16) and Neuro (they will call her back for appointment).  Member will call PCP and Cardiology herself with the help of her daughter (both providers currently out of the country).  6/29 - Has follow up appointments tomorrow for carotid ultrasound as well as with IR.  State daughter will provide transportation  7/25 - Has follow up appointment with ortho on 8/4 for ongoing joint and back pain.  Also will see neuro on 9/15  8/26 - Has virtual visit with PCP today     COMPLETED: THN - Plan for Long Term Care-Stroke       Timeframe:  Long-Range Goal Priority:  Medium Start Date:         5/2                    Expected End Date:    8/2  Barriers: Knowledge Support System Visual                      - check out other places when staying at home is no  longer possible (assisted living center, nursing home) - check out services like in-home help or adult day care - list the symptoms that would make staying at home too hard    Why is this important?   Having and recovering from a stroke can be scary and stressful.  You can reduce stress by planning.  Preparing for the future is one of the most important things to do.  Thinking about how much care you/your loved one will need and how much it will cost is not easy.  You/your loved one can be part of making decisions for the future.  Making sure that your/your loved one's wishes for care are known is important.     Notes:   5/2 - Discussed Medicaid application and PCS process.  Referral to Care Guide for medicaid guidance.    6/29 - Report she continues to recover from stroke, has daughters and grandchildren helping to recovery process.  7/25 - Although she report having some help from family, feels she is now in need of additional support for bathing and dressing. Advised that until Medicaid has been approved, this will be an out of pocket expense.  Verbalizes understanding, does not have funds to pay out of pocket, will continue to solicit help from family  8/26 - Report family has continued to provide support.  Will follow up with Medicaid eligibility     THN - Prevent Falls and Injury evidenced by no reported falls       Timeframe:  Short-Term Goal Priority:  High Start Date:  8/26                 Expected End Date:      11/26                 Barriers: Knowledge Visual   - always use handrails on the stairs - always wear low-heeled or flat shoes or slippers with nonskid soles - learn how to get back up if I fall - use a cane or walker - wear my glasses and/or hearing aid    Why is this important?   Most falls happen when it is hard for you to walk safely. Your balance may be off because of an illness. You may have pain in your knees, hip or other joints.  You may be  overly tired or taking medicines that make you sleepy. You may not be able to see or hear clearly.  Falls can lead to broken bones, bruises or other injuries.  There are things you can do to help prevent falling.     Notes:   8/26 - report recent issues with vertigo and a fall yesterday.  State Vertigo is an old problem, will ask PCP for medication today.  Inquired about the need for PT, state she does not want services        Plan:  Follow-up: Patient agrees to Care Plan and Follow-up. Follow-up in 1 month(s).  [name], [place], MSN [place] [phone]

## 2021-04-11 DIAGNOSIS — J440 Chronic obstructive pulmonary disease with acute lower respiratory infection: Secondary | ICD-10-CM | POA: Diagnosis not present

## 2021-04-11 DIAGNOSIS — Z09 Encounter for follow-up examination after completed treatment for conditions other than malignant neoplasm: Secondary | ICD-10-CM | POA: Diagnosis not present

## 2021-04-11 DIAGNOSIS — J209 Acute bronchitis, unspecified: Secondary | ICD-10-CM | POA: Diagnosis not present

## 2021-04-11 DIAGNOSIS — E1140 Type 2 diabetes mellitus with diabetic neuropathy, unspecified: Secondary | ICD-10-CM | POA: Diagnosis not present

## 2021-04-11 DIAGNOSIS — Z79899 Other long term (current) drug therapy: Secondary | ICD-10-CM | POA: Diagnosis not present

## 2021-04-11 DIAGNOSIS — J019 Acute sinusitis, unspecified: Secondary | ICD-10-CM | POA: Diagnosis not present

## 2021-04-11 DIAGNOSIS — M7989 Other specified soft tissue disorders: Secondary | ICD-10-CM | POA: Diagnosis not present

## 2021-04-22 DIAGNOSIS — E119 Type 2 diabetes mellitus without complications: Secondary | ICD-10-CM | POA: Diagnosis not present

## 2021-04-22 DIAGNOSIS — R911 Solitary pulmonary nodule: Secondary | ICD-10-CM | POA: Diagnosis not present

## 2021-04-22 DIAGNOSIS — R918 Other nonspecific abnormal finding of lung field: Secondary | ICD-10-CM | POA: Diagnosis not present

## 2021-04-22 DIAGNOSIS — R590 Localized enlarged lymph nodes: Secondary | ICD-10-CM | POA: Diagnosis not present

## 2021-04-22 DIAGNOSIS — Z9049 Acquired absence of other specified parts of digestive tract: Secondary | ICD-10-CM | POA: Diagnosis not present

## 2021-04-22 DIAGNOSIS — Z981 Arthrodesis status: Secondary | ICD-10-CM | POA: Diagnosis not present

## 2021-04-23 DIAGNOSIS — G894 Chronic pain syndrome: Secondary | ICD-10-CM | POA: Diagnosis not present

## 2021-04-23 DIAGNOSIS — Z79899 Other long term (current) drug therapy: Secondary | ICD-10-CM | POA: Diagnosis not present

## 2021-04-23 DIAGNOSIS — M545 Low back pain, unspecified: Secondary | ICD-10-CM | POA: Diagnosis not present

## 2021-04-23 DIAGNOSIS — M4326 Fusion of spine, lumbar region: Secondary | ICD-10-CM | POA: Diagnosis not present

## 2021-04-23 DIAGNOSIS — E559 Vitamin D deficiency, unspecified: Secondary | ICD-10-CM | POA: Diagnosis not present

## 2021-04-23 DIAGNOSIS — M542 Cervicalgia: Secondary | ICD-10-CM | POA: Diagnosis not present

## 2021-04-28 DIAGNOSIS — G4733 Obstructive sleep apnea (adult) (pediatric): Secondary | ICD-10-CM | POA: Diagnosis not present

## 2021-04-28 DIAGNOSIS — J4520 Mild intermittent asthma, uncomplicated: Secondary | ICD-10-CM | POA: Diagnosis not present

## 2021-04-28 DIAGNOSIS — R911 Solitary pulmonary nodule: Secondary | ICD-10-CM | POA: Diagnosis not present

## 2021-04-28 DIAGNOSIS — R5383 Other fatigue: Secondary | ICD-10-CM | POA: Diagnosis not present

## 2021-05-01 ENCOUNTER — Ambulatory Visit: Payer: Self-pay | Admitting: *Deleted

## 2021-05-01 ENCOUNTER — Other Ambulatory Visit: Payer: Self-pay | Admitting: *Deleted

## 2021-05-01 DIAGNOSIS — M1712 Unilateral primary osteoarthritis, left knee: Secondary | ICD-10-CM | POA: Diagnosis not present

## 2021-05-01 NOTE — Patient Outreach (Signed)
[facility]) Care Management  05/01/2021  Barbara French [birth year] [id number]   Outgoing call placed to member, no answer, HIPAA compliant voice message left.  Will follow up within the next 3-4 business days.  [name], [place], MSN [place] [phone]

## 2021-05-06 ENCOUNTER — Other Ambulatory Visit: Payer: Self-pay | Admitting: *Deleted

## 2021-05-06 NOTE — Patient Outreach (Signed)
[name]) Care Management  05/06/2021  Barbara French [birth year] [id number]   Outreach attempt #2, successful.  Member report she is doing "alright."  State she is looking forward to having gel injection in her knee for chronic pain, was waiting to have it approved by insurance.  She and daughter has followed up with PCP for glucose meter, style that was ordered was not approved by insurance.  PCP will order another one that will be approved.  Per daughter, member has appointments coming up with PCP, for CT, and GI.  Denies any urgent concerns, encouraged to contact this care manager with questions.  Agrees to follow up within the next month.   Goals Addressed             This Visit's Progress    THN - Find Help in My Community   On track    Timeframe:  Short-Term Goal Priority:  Medium Start Date:       5/2                      Expected End Date:    11/2 (extended)               Barriers: Support System Visual    - follow-up on any referrals for help I am given    Why is this important?   Knowing how and where to find help for yourself or family in your neighborhood and community is an important skill.  You will want to take some steps to learn how.    Notes:   5/2 - Referral placed to Care Guide for food resources and guidance with Medicaid application process  6/76 - Advised member that [place] had made multiple attempts to contact her.  Provided with contact information, encouraged to call as soon as possible.  7/25 - Confirms she has been in contact with care guide and should have received Medicaid application.  Encouraged to check with daughter about application and submittal  8/26 - Report she still has not received Medicaid information, will have Care guide resend  9/27 - Confirmed with member and daughter that member has completed Medicaid application with help of  insurance representative.  Awaiting call from Medicaid case worker for  approval and further instructions for in home aide assistance     THN - Prevent Falls and Injury evidenced by no reported falls   Not on track    Timeframe:  Short-Term Goal Priority:  High Start Date:            8/26                 Expected End Date:      11/26                 Barriers: Knowledge Visual   - always use handrails on the stairs - always wear low-heeled or flat shoes or slippers with nonskid soles - learn how to get back up if I fall - use a cane or walker - wear my glasses and/or hearing aid    Why is this important?   Most falls happen when it is hard for you to walk safely. Your balance may be off because of an illness. You may have pain in your knees, hip or other joints.  You may be overly tired or taking medicines that make you sleepy. You may not be able to see or hear clearly.  Falls can  lead to broken bones, bruises or other injuries.  There are things you can do to help prevent falling.     Notes:   8/26 - report recent issues with vertigo and a fall yesterday.  State Vertigo is an old problem, will ask PCP for medication today.  Inquired about the need for PT, state she does not want services  9/27 - State she has had another fall last week.  Looking forward to selling her home so she can move to a one level home that is safer.  Continues to decline offer to have PT started.  She feels she will be better once she has an aide in the home.       Barbara French, [place], MSN [place] [phone]

## 2021-05-08 DIAGNOSIS — H47611 Cortical blindness, right side of brain: Secondary | ICD-10-CM | POA: Diagnosis not present

## 2021-05-08 DIAGNOSIS — H35033 Hypertensive retinopathy, bilateral: Secondary | ICD-10-CM | POA: Diagnosis not present

## 2021-05-08 DIAGNOSIS — H43823 Vitreomacular adhesion, bilateral: Secondary | ICD-10-CM | POA: Diagnosis not present

## 2021-05-08 DIAGNOSIS — H3561 Retinal hemorrhage, right eye: Secondary | ICD-10-CM | POA: Diagnosis not present

## 2021-05-15 DIAGNOSIS — G9389 Other specified disorders of brain: Secondary | ICD-10-CM | POA: Diagnosis not present

## 2021-05-15 DIAGNOSIS — R279 Unspecified lack of coordination: Secondary | ICD-10-CM | POA: Diagnosis not present

## 2021-05-15 DIAGNOSIS — I6523 Occlusion and stenosis of bilateral carotid arteries: Secondary | ICD-10-CM | POA: Diagnosis not present

## 2021-05-15 DIAGNOSIS — M1712 Unilateral primary osteoarthritis, left knee: Secondary | ICD-10-CM | POA: Diagnosis not present

## 2021-05-15 DIAGNOSIS — Z8673 Personal history of transient ischemic attack (TIA), and cerebral infarction without residual deficits: Secondary | ICD-10-CM | POA: Diagnosis not present

## 2021-05-15 DIAGNOSIS — R278 Other lack of coordination: Secondary | ICD-10-CM | POA: Diagnosis not present

## 2021-05-19 DIAGNOSIS — I10 Essential (primary) hypertension: Secondary | ICD-10-CM | POA: Diagnosis not present

## 2021-05-19 DIAGNOSIS — M545 Low back pain, unspecified: Secondary | ICD-10-CM | POA: Diagnosis not present

## 2021-05-19 DIAGNOSIS — M5106 Intervertebral disc disorders with myelopathy, lumbar region: Secondary | ICD-10-CM | POA: Diagnosis not present

## 2021-05-19 DIAGNOSIS — M7989 Other specified soft tissue disorders: Secondary | ICD-10-CM | POA: Diagnosis not present

## 2021-05-19 DIAGNOSIS — G894 Chronic pain syndrome: Secondary | ICD-10-CM | POA: Diagnosis not present

## 2021-05-19 DIAGNOSIS — M542 Cervicalgia: Secondary | ICD-10-CM | POA: Diagnosis not present

## 2021-05-19 DIAGNOSIS — E119 Type 2 diabetes mellitus without complications: Secondary | ICD-10-CM | POA: Diagnosis not present

## 2021-05-19 DIAGNOSIS — I739 Peripheral vascular disease, unspecified: Secondary | ICD-10-CM | POA: Diagnosis not present

## 2021-06-02 DIAGNOSIS — G4733 Obstructive sleep apnea (adult) (pediatric): Secondary | ICD-10-CM | POA: Diagnosis not present

## 2021-06-02 DIAGNOSIS — J4520 Mild intermittent asthma, uncomplicated: Secondary | ICD-10-CM | POA: Diagnosis not present

## 2021-06-02 DIAGNOSIS — R911 Solitary pulmonary nodule: Secondary | ICD-10-CM | POA: Diagnosis not present

## 2021-06-02 DIAGNOSIS — R5383 Other fatigue: Secondary | ICD-10-CM | POA: Diagnosis not present

## 2021-06-02 DIAGNOSIS — F17210 Nicotine dependence, cigarettes, uncomplicated: Secondary | ICD-10-CM | POA: Diagnosis not present

## 2021-06-03 ENCOUNTER — Other Ambulatory Visit: Payer: Self-pay | Admitting: *Deleted

## 2021-06-03 NOTE — Patient Outreach (Signed)
[facility]) Care Management  06/03/2021  Barbara French 12-21-1955 [id number]   Outgoing call placed to member, no answer to listed home number (HIPAA compliant voice message left) or mobile number (mailbox full).  Will follow up within the next 3-5 business days.  [name], [place], MSN [place] [phone]

## 2021-06-10 ENCOUNTER — Other Ambulatory Visit: Payer: Self-pay | Admitting: *Deleted

## 2021-06-10 NOTE — Patient Outreach (Signed)
[facility]) Care Management  [place]  06/10/2021   TALEEYAH BORA 07-15-1956 [id number]   Outreach attempt #2, successful.  Denies any urgent concerns, encouraged to contact this care manager with questions.    Encounter Medications:  Outpatient Encounter Medications as of 06/10/2021  Medication Sig Note   acetaminophen (TYLENOL) 500 MG tablet Take 1,000 mg by mouth 4 (four) times daily as needed for moderate pain or headache.    albuterol (VENTOLIN HFA) 108 (90 Base) MCG/ACT inhaler Inhale 1-2 puffs into the lungs 4 (four) times daily as needed for wheezing or shortness of breath.    amLODipine (NORVASC) 5 MG tablet Take 1 tablet by mouth every morning.    amoxicillin-clavulanate (AUGMENTIN) 875-125 MG tablet Take 1 tablet by mouth 2 (two) times daily. 12/02/2020: 7 day course started 11/28/2020   amphetamine-dextroamphetamine (ADDERALL) 15 MG tablet Take 15 mg by mouth daily as needed (when needs to be alert/awake; for MD appointments). 12/02/2020: #60/30 days filled 10/03/2020 [facility] per PMP AWARE   apixaban (ELIQUIS) 5 MG TABS tablet Take 5 mg by mouth 2 (two) times daily. 12/02/2020: On hold due to procedure scheduled for 12/04/2020   atorvastatin (LIPITOR) 40 MG tablet Take 40 mg by mouth every evening.    cilostazol (PLETAL) 50 MG tablet Take 100 mg by mouth 2 (two) times daily.    cyclobenzaprine (FLEXERIL) 10 MG tablet Take 10 mg by mouth 3 (three) times daily.    EPINEPHrine 0.3 mg/0.3 mL IJ SOAJ injection Inject 0.3 mg into the muscle once as needed for anaphylaxis.    escitalopram (LEXAPRO) 20 MG tablet Take 20 mg by mouth every morning.    gabapentin (NEURONTIN) 300 MG capsule Take 300 mg by mouth 3 (three) times daily.    HYDROmorphone (DILAUDID) 4 MG tablet Take 4 mg by mouth 4 (four) times daily as needed (pain). 12/02/2020: #105/26 filled 11/13/2020 [facility] per PMP AWARE   losartan (COZAAR) 50 MG tablet Take 50 mg by mouth every morning.     Melatonin 5 MG CHEW Chew 10 mg by mouth at bedtime.    metFORMIN (GLUCOPHAGE) 500 MG tablet Take 500 mg by mouth 2 (two) times daily with a meal.    nitroGLYCERIN (NITROSTAT) 0.4 MG SL tablet Place 1 tablet (0.4 mg total) under the tongue every 5 (five) minutes as needed for chest pain. 12/02/2020: Pt took 2 tablets one day last week   pantoprazole (PROTONIX) 40 MG tablet Take 40 mg by mouth every morning.    potassium chloride (KLOR-CON) 10 MEQ tablet Take 10 mEq by mouth every morning.    sitaGLIPtin (JANUVIA) 100 MG tablet Take 100 mg by mouth every morning.    No facility-administered encounter medications on file as of 06/10/2021.    Functional Status:  In your present state of health, do you have any difficulty performing the following activities: 12/05/2020 12/02/2020  Hearing? N N  Vision? N Y  Difficulty concentrating or making decisions? N N  Walking or climbing stairs? - N  Dressing or bathing? - N  Doing errands, shopping? - Y  Some recent data might be hidden    Fall/Depression Screening: Fall Risk  02/05/2021  Falls in the past year? 0  Number falls in past yr: 0  Injury with Fall? 0   PHQ 2/9 Scores 12/09/2020  PHQ - 2 Score 1    Assessment:   Care Plan Care Plan : General Plan of Care (Adult)  Updates made by [name],  RN since 06/10/2021 12:00 AM     Problem: Concern for safety due to recent fall   Priority: High     Long-Range Goal: Patient will not have any falls in the next 3 months   Start Date: 04/04/2021  Expected End Date: 07/04/2021  This Visit's Progress: On track  Priority: High  Note:   Current Barriers:  Knowledge Deficits related to plan of care for management of HTN, DMII, and fall  Care Coordination needs related to ADL IADL limitations   11/1 - Referral placed to CSW to help with PCS application. Bunnie Pion, member report doing well.  RNCM Clinical Goal(s):  Patient will verbalize understanding of plan for management of HTN, DMII, and  fall attend all scheduled medical appointments: PCP and Neuro work with community resource care guide to address needs related to ADL IADL limitations Medicaid process  through collaboration with Consulting civil engineer, provider, and care team.   Interventions: 1:1 collaboration with primary care provider regarding development and update of comprehensive plan of care as evidenced by provider attestation and co-signature Inter-disciplinary care team collaboration (see longitudinal plan of care) Evaluation of current treatment plan related to  self management and patient's adherence to plan as established by provider   Falls:  (Status: Goal on track: NO.)  Has had more falls since last outreach Reviewed medications and discussed potential side effects of medications such as dizziness and frequent urination Assessed for falls since last encounter Provided patient information for fall alert systems Screening for signs and symptoms of depression related to chronic disease state  Patient Goals/Self-Care Activities: Patient will attend all scheduled provider appointments Patient will continue to perform ADL's independently        Goals Addressed             This Visit's Progress    COMPLETED: THN - Find Help in My Community       Timeframe:  Short-Term Goal Priority:  Medium Start Date:       5/2                      Expected End Date:    11/2 (extended)               Barriers: Support System Visual    - follow-up on any referrals for help I am given    Why is this important?   Knowing how and where to find help for yourself or family in your neighborhood and community is an important skill.  You will want to take some steps to learn how.    Notes:   5/2 - Referral placed to Care Guide for food resources and guidance with Medicaid application process  9/56 - Advised member that [place] had made multiple attempts to contact her.  Provided with contact information, encouraged  to call as soon as possible.  7/25 - Confirms she has been in contact with care guide and should have received Medicaid application.  Encouraged to check with daughter about application and submittal  8/26 - Report she still has not received Medicaid information, will have Care guide resend  9/27 - Confirmed with member and daughter that member has completed Medicaid application with help of  insurance representative.  Awaiting call from Medicaid case worker for approval and further instructions for in home aide assistance  11/1 - Resolving due to duplicate goal     THN - Prevent Falls and Injury evidenced by no reported falls  Not on track    Timeframe:  Short-Term Goal Priority:  High Start Date:            8/26                 Expected End Date:      11/26                 Barriers: Knowledge Visual   - always use handrails on the stairs - always wear low-heeled or flat shoes or slippers with nonskid soles - learn how to get back up if I fall - use a cane or walker - wear my glasses and/or hearing aid    Why is this important?   Most falls happen when it is hard for you to walk safely. Your balance may be off because of an illness. You may have pain in your knees, hip or other joints.  You may be overly tired or taking medicines that make you sleepy. You may not be able to see or hear clearly.  Falls can lead to broken bones, bruises or other injuries.  There are things you can do to help prevent falling.     Notes:   8/26 - report recent issues with vertigo and a fall yesterday.  State Vertigo is an old problem, will ask PCP for medication today.  Inquired about the need for PT, state she does not want services  9/27 - State she has had another fall last week.  Looking forward to selling her home so she can move to a one level home that is safer.  Continues to decline offer to have PT started.  She feels she will be better once she has an aide in the home.  11/1 - Report she  has had more falls.  Now has Medicaid, interested in aide services to help during the day. Feels this will decrease fall risk        Plan:  Follow-up: Patient agrees to Care Plan and Follow-up. Follow-up in 1 month(s)  [name], RN, MSN [place] [phone]

## 2021-06-13 ENCOUNTER — Other Ambulatory Visit: Payer: Self-pay | Admitting: *Deleted

## 2021-06-13 NOTE — Patient Outreach (Cosign Needed)
  Care Management   Follow Up Note   06/13/2021 Name: Barbara French MRN: [id number] DOB: Nov 05, 1955   Referred by: Nicholos Johns, MD  Reason for referral : Initial Assessment - Unsuccessful Initial Telephone Outreach Call Attempt.  An unsuccessful telephone outreach was attempted today. The patient was referred to the case management team for assistance with care management and care coordination. HIPAA compliant messages left on voicemail, including contact information, encouraging patient to return LCSW's call at her earliest convenience.  LCSW will make a second initial telephone outreach call to patient within the next 5-7 business days, if a return call is not received from patient in the meantime.  Follow-Up Plan:  06/23/2021 at 3:45pm

## 2021-06-16 DIAGNOSIS — I2510 Atherosclerotic heart disease of native coronary artery without angina pectoris: Secondary | ICD-10-CM | POA: Diagnosis not present

## 2021-06-16 DIAGNOSIS — Z7982 Long term (current) use of aspirin: Secondary | ICD-10-CM | POA: Diagnosis not present

## 2021-06-16 DIAGNOSIS — J9601 Acute respiratory failure with hypoxia: Secondary | ICD-10-CM | POA: Diagnosis not present

## 2021-06-16 DIAGNOSIS — J811 Chronic pulmonary edema: Secondary | ICD-10-CM | POA: Diagnosis not present

## 2021-06-16 DIAGNOSIS — Z8616 Personal history of COVID-19: Secondary | ICD-10-CM | POA: Diagnosis not present

## 2021-06-16 DIAGNOSIS — Z8249 Family history of ischemic heart disease and other diseases of the circulatory system: Secondary | ICD-10-CM | POA: Diagnosis not present

## 2021-06-16 DIAGNOSIS — R531 Weakness: Secondary | ICD-10-CM | POA: Diagnosis not present

## 2021-06-16 DIAGNOSIS — M7989 Other specified soft tissue disorders: Secondary | ICD-10-CM | POA: Diagnosis not present

## 2021-06-16 DIAGNOSIS — R918 Other nonspecific abnormal finding of lung field: Secondary | ICD-10-CM | POA: Diagnosis not present

## 2021-06-16 DIAGNOSIS — Z9981 Dependence on supplemental oxygen: Secondary | ICD-10-CM | POA: Diagnosis not present

## 2021-06-16 DIAGNOSIS — G8194 Hemiplegia, unspecified affecting left nondominant side: Secondary | ICD-10-CM | POA: Diagnosis not present

## 2021-06-16 DIAGNOSIS — I6621 Occlusion and stenosis of right posterior cerebral artery: Secondary | ICD-10-CM | POA: Diagnosis not present

## 2021-06-16 DIAGNOSIS — Z86711 Personal history of pulmonary embolism: Secondary | ICD-10-CM | POA: Diagnosis not present

## 2021-06-16 DIAGNOSIS — R569 Unspecified convulsions: Secondary | ICD-10-CM | POA: Diagnosis not present

## 2021-06-16 DIAGNOSIS — Z8673 Personal history of transient ischemic attack (TIA), and cerebral infarction without residual deficits: Secondary | ICD-10-CM | POA: Diagnosis not present

## 2021-06-16 DIAGNOSIS — W1839XA Other fall on same level, initial encounter: Secondary | ICD-10-CM | POA: Diagnosis not present

## 2021-06-16 DIAGNOSIS — I6523 Occlusion and stenosis of bilateral carotid arteries: Secondary | ICD-10-CM | POA: Diagnosis not present

## 2021-06-16 DIAGNOSIS — J449 Chronic obstructive pulmonary disease, unspecified: Secondary | ICD-10-CM | POA: Diagnosis not present

## 2021-06-16 DIAGNOSIS — Z9889 Other specified postprocedural states: Secondary | ICD-10-CM | POA: Diagnosis not present

## 2021-06-16 DIAGNOSIS — I639 Cerebral infarction, unspecified: Secondary | ICD-10-CM | POA: Diagnosis not present

## 2021-06-16 DIAGNOSIS — R6889 Other general symptoms and signs: Secondary | ICD-10-CM | POA: Diagnosis not present

## 2021-06-16 DIAGNOSIS — Z86718 Personal history of other venous thrombosis and embolism: Secondary | ICD-10-CM | POA: Diagnosis not present

## 2021-06-16 DIAGNOSIS — Y999 Unspecified external cause status: Secondary | ICD-10-CM | POA: Diagnosis not present

## 2021-06-16 DIAGNOSIS — I10 Essential (primary) hypertension: Secondary | ICD-10-CM | POA: Diagnosis not present

## 2021-06-16 DIAGNOSIS — Z7901 Long term (current) use of anticoagulants: Secondary | ICD-10-CM | POA: Diagnosis not present

## 2021-06-16 DIAGNOSIS — E1140 Type 2 diabetes mellitus with diabetic neuropathy, unspecified: Secondary | ICD-10-CM | POA: Diagnosis not present

## 2021-06-16 DIAGNOSIS — E119 Type 2 diabetes mellitus without complications: Secondary | ICD-10-CM | POA: Diagnosis not present

## 2021-06-16 DIAGNOSIS — Z7902 Long term (current) use of antithrombotics/antiplatelets: Secondary | ICD-10-CM | POA: Diagnosis not present

## 2021-06-16 DIAGNOSIS — Z7984 Long term (current) use of oral hypoglycemic drugs: Secondary | ICD-10-CM | POA: Diagnosis not present

## 2021-06-16 DIAGNOSIS — Z888 Allergy status to other drugs, medicaments and biological substances status: Secondary | ICD-10-CM | POA: Diagnosis not present

## 2021-06-16 DIAGNOSIS — J849 Interstitial pulmonary disease, unspecified: Secondary | ICD-10-CM | POA: Diagnosis not present

## 2021-06-16 DIAGNOSIS — Z955 Presence of coronary angioplasty implant and graft: Secondary | ICD-10-CM | POA: Diagnosis not present

## 2021-06-16 DIAGNOSIS — Z743 Need for continuous supervision: Secondary | ICD-10-CM | POA: Diagnosis not present

## 2021-06-16 DIAGNOSIS — G4730 Sleep apnea, unspecified: Secondary | ICD-10-CM | POA: Diagnosis not present

## 2021-06-16 DIAGNOSIS — F17210 Nicotine dependence, cigarettes, uncomplicated: Secondary | ICD-10-CM | POA: Diagnosis not present

## 2021-06-16 DIAGNOSIS — Z96651 Presence of right artificial knee joint: Secondary | ICD-10-CM | POA: Diagnosis not present

## 2021-06-16 DIAGNOSIS — E1151 Type 2 diabetes mellitus with diabetic peripheral angiopathy without gangrene: Secondary | ICD-10-CM | POA: Diagnosis not present

## 2021-06-16 DIAGNOSIS — R0600 Dyspnea, unspecified: Secondary | ICD-10-CM | POA: Diagnosis not present

## 2021-06-16 DIAGNOSIS — R404 Transient alteration of awareness: Secondary | ICD-10-CM | POA: Diagnosis not present

## 2021-06-16 DIAGNOSIS — Z981 Arthrodesis status: Secondary | ICD-10-CM | POA: Diagnosis not present

## 2021-06-16 DIAGNOSIS — E785 Hyperlipidemia, unspecified: Secondary | ICD-10-CM | POA: Diagnosis not present

## 2021-06-17 DIAGNOSIS — Z9981 Dependence on supplemental oxygen: Secondary | ICD-10-CM | POA: Diagnosis not present

## 2021-06-17 DIAGNOSIS — I10 Essential (primary) hypertension: Secondary | ICD-10-CM | POA: Diagnosis not present

## 2021-06-17 DIAGNOSIS — E119 Type 2 diabetes mellitus without complications: Secondary | ICD-10-CM | POA: Diagnosis not present

## 2021-06-17 DIAGNOSIS — E785 Hyperlipidemia, unspecified: Secondary | ICD-10-CM | POA: Diagnosis not present

## 2021-06-17 DIAGNOSIS — J9601 Acute respiratory failure with hypoxia: Secondary | ICD-10-CM | POA: Diagnosis not present

## 2021-06-17 DIAGNOSIS — Z955 Presence of coronary angioplasty implant and graft: Secondary | ICD-10-CM | POA: Diagnosis not present

## 2021-06-17 DIAGNOSIS — R569 Unspecified convulsions: Secondary | ICD-10-CM | POA: Diagnosis not present

## 2021-06-23 ENCOUNTER — Other Ambulatory Visit: Payer: Self-pay | Admitting: *Deleted

## 2021-06-23 DIAGNOSIS — G894 Chronic pain syndrome: Secondary | ICD-10-CM | POA: Diagnosis not present

## 2021-06-23 DIAGNOSIS — M542 Cervicalgia: Secondary | ICD-10-CM | POA: Diagnosis not present

## 2021-06-23 DIAGNOSIS — M545 Low back pain, unspecified: Secondary | ICD-10-CM | POA: Diagnosis not present

## 2021-06-23 DIAGNOSIS — M5106 Intervertebral disc disorders with myelopathy, lumbar region: Secondary | ICD-10-CM | POA: Diagnosis not present

## 2021-06-24 ENCOUNTER — Encounter: Payer: Self-pay | Admitting: *Deleted

## 2021-06-24 NOTE — Patient Outreach (Cosign Needed)
Care Management Clinical Social Work Note  06/24/2021 Name: Barbara French MRN: [id number] DOB: [birth year]  Barbara French is a 65 y.o. year old female who is a primary care patient of Nicholos Johns, MD.  The Care Management team was consulted for assistance with chronic disease management and coordination needs.  Engaged with patient by telephone for initial visit in response to provider referral for social work chronic care management and care coordination services  Consent to Services:  Barbara French was given information about Care Management services today including:  Care Management services includes personalized support from designated clinical staff supervised by her physician, including individualized plan of care and coordination with other care providers 24/7 contact phone numbers for assistance for urgent and routine care needs. The patient may stop case management services at any time by phone call to the office staff.  Patient agreed to services and consent obtained.   Assessment: Review of patient past medical history, allergies, medications, and health status, including review of relevant consultants reports was performed today as part of a comprehensive evaluation and provision of chronic care management and care coordination services.  SDOH (Social Determinants of Health) assessments and interventions performed:  SDOH Interventions    Flowsheet Row Most Recent Value  SDOH Interventions   Food Insecurity Interventions Intervention Not Indicated  Financial Strain Interventions Intervention Not Indicated  Housing Interventions Intervention Not Indicated  Intimate Partner Violence Interventions Intervention Not Indicated  Physical Activity Interventions Patient Refused  Stress Interventions Intervention Not Indicated, Offered [employer] Resources, Provide Counseling  Social Connections Interventions Intervention Not Indicated  Transportation Interventions Intervention  Not Indicated        Advanced Directives Status: See Care Plan for related entries.  Care Plan  Allergies  Allergen Reactions   Bee Venom Anaphylaxis    Yellow jackets-Throat closed up    Cortisone Other (See Comments), Hives and Swelling    Injection caused temporary paralysis  Shots   Peanut Oil Other (See Comments)    Allergy test showed positive never had a reaction. Keeps epipen with her Identified by allergy test, never had a reaction    Outpatient Encounter Medications as of 06/23/2021  Medication Sig Note   acetaminophen (TYLENOL) 500 MG tablet Take 1,000 mg by mouth 4 (four) times daily as needed for moderate pain or headache.    albuterol (VENTOLIN HFA) 108 (90 Base) MCG/ACT inhaler Inhale 1-2 puffs into the lungs 4 (four) times daily as needed for wheezing or shortness of breath.    amLODipine (NORVASC) 5 MG tablet Take 1 tablet by mouth every morning.    amoxicillin-clavulanate (AUGMENTIN) 875-125 MG tablet Take 1 tablet by mouth 2 (two) times daily. 12/02/2020: 7 day course started 11/28/2020   amphetamine-dextroamphetamine (ADDERALL) 15 MG tablet Take 15 mg by mouth daily as needed (when needs to be alert/awake; for MD appointments). 12/02/2020: #60/30 days filled 10/03/2020 [facility] per PMP AWARE   apixaban (ELIQUIS) 5 MG TABS tablet Take 5 mg by mouth 2 (two) times daily. 12/02/2020: On hold due to procedure scheduled for 12/04/2020   atorvastatin (LIPITOR) 40 MG tablet Take 40 mg by mouth every evening.    cilostazol (PLETAL) 50 MG tablet Take 100 mg by mouth 2 (two) times daily.    cyclobenzaprine (FLEXERIL) 10 MG tablet Take 10 mg by mouth 3 (three) times daily.    EPINEPHrine 0.3 mg/0.3 mL IJ SOAJ injection Inject 0.3 mg into the muscle once as needed for anaphylaxis.    escitalopram (LEXAPRO)  20 MG tablet Take 20 mg by mouth every morning.    gabapentin (NEURONTIN) 300 MG capsule Take 300 mg by mouth 3 (three) times daily.    HYDROmorphone (DILAUDID) 4 MG tablet  Take 4 mg by mouth 4 (four) times daily as needed (pain). 12/02/2020: #105/26 filled 11/13/2020 [facility] per PMP AWARE   losartan (COZAAR) 50 MG tablet Take 50 mg by mouth every morning.    Melatonin 5 MG CHEW Chew 10 mg by mouth at bedtime.    metFORMIN (GLUCOPHAGE) 500 MG tablet Take 500 mg by mouth 2 (two) times daily with a meal.    nitroGLYCERIN (NITROSTAT) 0.4 MG SL tablet Place 1 tablet (0.4 mg total) under the tongue every 5 (five) minutes as needed for chest pain. 12/02/2020: Pt took 2 tablets one day last week   pantoprazole (PROTONIX) 40 MG tablet Take 40 mg by mouth every morning.    potassium chloride (KLOR-CON) 10 MEQ tablet Take 10 mEq by mouth every morning.    sitaGLIPtin (JANUVIA) 100 MG tablet Take 100 mg by mouth every morning.    No facility-administered encounter medications on file as of 06/23/2021.    Patient Active Problem List   Diagnosis Date Noted   Stenosis of right internal carotid artery    CVA (cerebral vascular accident) ([place]) 12/02/2020   Hyperkalemia 12/02/2020   PAD (peripheral artery disease) ([place]) 05/08/2019   COPD ? GOLD stage/ active smoker 12/20/2018   Angina pectoris ([place]) 06/27/2018   Lumbar disc disease with radiculopathy 10/05/2017   Coronary artery disease involving native coronary artery of native heart with angina pectoris ([place]) 02/23/2017   Essential hypertension 02/23/2017   History of pulmonary embolism 02/23/2017   Neoplasm of larynx 09/29/2016   Chronic laryngitis 09/29/2016   RLS (restless legs syndrome) 07/28/2016   Arthritis 02/12/2016   Respiratory bronchiolitis interstitial lung disease ([place]) 02/12/2016   Moderate COPD (chronic obstructive pulmonary disease) ([place]) 02/12/2016   Acute respiratory failure ([place]) 01/27/2016   Anemia 01/27/2016   Hyponatremia 01/27/2016   Sepsis ([place]) 01/27/2016   Asthma 01/27/2016   S/P total knee replacement 01/20/2016   Atheroscler of native artery of both legs with intermit claudication ([place])  12/16/2015   Bilateral carotid artery stenosis 12/16/2015   Post-traumatic osteoarthritis of right knee 07/25/2015   Cigarette smoker 05/07/2015   Diabetes mellitus due to underlying condition with unspecified complications ([place]) 29/92/4268   Dyslipidemia 05/07/2015   History of carotid endarterectomy 05/07/2015    Conditions to be addressed/monitored: HTN and COPD.  Limited Social Support, Level of Care Concerns, ADL/IADL Limitations, Social Isolation, Limited Access to Caregiver, and Lacks Knowledge of [employer].  Care Plan : LCSW Plan of Care  Updates made by Francis Gaines, LCSW since 06/24/2021 12:00 AM     Problem: Maintain My Quality of Life through [place].   Priority: High     Goal: Maintain My Quality of Life through [place].   Start Date: 06/23/2021  Expected End Date: 08/22/2021  This Visit's Progress: On track  Priority: High  Note:   Current Barriers:   Patient with Chronic Obstructive Pulmonary Disease, Acute Respiratory Failure, Hypertension, Coronary Artery Disease and Cerebral Vascular Accident needs Support, Education, Referrals, Resources, Advocacy and Care Coordination to resolve unmet personal care needs in the home. Patient is unable to self-administer medications as prescribed or consistently perform ADL's/IADL's independently. Clinical Goals:  Patient will have [place] in place, through [employer].  Patient will demonstrate improved  health management independence as evidenced by having in-home care services in place. Clinical Interventions: Patient interviewed and appropriate assessments performed. Collaboration with Primary Care Physician, Dr. Nicholos Johns regarding development and update of comprehensive plan of care as evidenced by provider attestation and co-signature. Inter-disciplinary care team collaboration (see longitudinal plan of care). Interventions performed:  Problem  Solving Solutions identified, Quality of Sleep Assessed and Sleep Hygiene Techniques promoted and [place] encouraged. Discussed plans with patient for ongoing care management follow-up and provided direct contact information for care management team. Collaborated with Primary Care Physician, Dr. Nicholos Johns regarding need for review and signature of [place] application.   Assisted patient with obtaining information about health plan benefits through [facility] and Adult Medicaid.   Mailed patient Personal Care Services Application, Instructions, and Agency Provider List. Provided education to patient regarding level of care options. Assessed needs, level of care concerns, basic eligibility and provided education on Personal Care Services application process.  Identified resources and durable medical equipment needed in the home to improve safety and promote independence. Patient Goals/Self-Care Activities:  Review Personal Care Services Application, Instructions, and Agency Provider List with children, and obtain assistance with application completion and submission to [employer]. Work with [employer] on a bi-weekly basis, until approved for Publishing rights manager through [employer].   Follow-Up:  07/08/2021 at 11:00am     Nat Christen, BSW, MSW, [place]  Licensed Clinical Social Worker  [place]  Mailing [place]. [address], [place], [place] [address] Physical Address-[address] [place], [place] [address] Toll Free Main # [phone] Fax # [phone] Cell # [phone]  [email or web address]

## 2021-07-02 DIAGNOSIS — I6521 Occlusion and stenosis of right carotid artery: Secondary | ICD-10-CM | POA: Diagnosis not present

## 2021-07-02 DIAGNOSIS — I739 Peripheral vascular disease, unspecified: Secondary | ICD-10-CM | POA: Diagnosis not present

## 2021-07-04 DIAGNOSIS — I6523 Occlusion and stenosis of bilateral carotid arteries: Secondary | ICD-10-CM | POA: Diagnosis not present

## 2021-07-04 DIAGNOSIS — I739 Peripheral vascular disease, unspecified: Secondary | ICD-10-CM | POA: Diagnosis not present

## 2021-07-08 ENCOUNTER — Other Ambulatory Visit: Payer: Self-pay | Admitting: *Deleted

## 2021-07-09 NOTE — Patient Outreach (Signed)
Care Management Clinical Social Work Note  07/09/2021 Name: Barbara French MRN: [id number] DOB: 1955/12/07  Barbara French is a 65 y.o. year old female who is a primary care patient of Nicholos Johns, MD.  The Care Management team was consulted for assistance with chronic disease management and coordination needs.  Engaged with patient by telephone for follow up visit in response to provider referral for social work chronic care management and care coordination services  Consent to Services:  Barbara French was given information about Care Management services today including:  Care Management services includes personalized support from designated clinical staff supervised by her physician, including individualized plan of care and coordination with other care providers 24/7 contact phone numbers for assistance for urgent and routine care needs. The patient may stop case management services at any time by phone call to the office staff.  Patient agreed to services and consent obtained.   Assessment: Review of patient past medical history, allergies, medications, and health status, including review of relevant consultants reports was performed today as part of a comprehensive evaluation and provision of chronic care management and care coordination services.  SDOH (Social Determinants of Health) assessments and interventions performed:    Advanced Directives Status: Not addressed in this encounter.  Care Plan  Allergies  Allergen Reactions   Bee Venom Anaphylaxis    Yellow jackets-Throat closed up    Cortisone Other (See Comments), Hives and Swelling    Injection caused temporary paralysis  Shots   Peanut Oil Other (See Comments)    Allergy test showed positive never had a reaction. Keeps epipen with her Identified by allergy test, never had a reaction    Outpatient Encounter Medications as of 07/08/2021  Medication Sig Note   acetaminophen (TYLENOL) 500 MG tablet Take 1,000 mg by mouth  4 (four) times daily as needed for moderate pain or headache.    albuterol (VENTOLIN HFA) 108 (90 Base) MCG/ACT inhaler Inhale 1-2 puffs into the lungs 4 (four) times daily as needed for wheezing or shortness of breath.    amLODipine (NORVASC) 5 MG tablet Take 1 tablet by mouth every morning.    amoxicillin-clavulanate (AUGMENTIN) 875-125 MG tablet Take 1 tablet by mouth 2 (two) times daily. 12/02/2020: 7 day course started 11/28/2020   amphetamine-dextroamphetamine (ADDERALL) 15 MG tablet Take 15 mg by mouth daily as needed (when needs to be alert/awake; for MD appointments). 12/02/2020: #60/30 days filled 10/03/2020 [facility] per PMP AWARE   apixaban (ELIQUIS) 5 MG TABS tablet Take 5 mg by mouth 2 (two) times daily. 12/02/2020: On hold due to procedure scheduled for 12/04/2020   atorvastatin (LIPITOR) 40 MG tablet Take 40 mg by mouth every evening.    cilostazol (PLETAL) 50 MG tablet Take 100 mg by mouth 2 (two) times daily.    cyclobenzaprine (FLEXERIL) 10 MG tablet Take 10 mg by mouth 3 (three) times daily.    EPINEPHrine 0.3 mg/0.3 mL IJ SOAJ injection Inject 0.3 mg into the muscle once as needed for anaphylaxis.    escitalopram (LEXAPRO) 20 MG tablet Take 20 mg by mouth every morning.    gabapentin (NEURONTIN) 300 MG capsule Take 300 mg by mouth 3 (three) times daily.    HYDROmorphone (DILAUDID) 4 MG tablet Take 4 mg by mouth 4 (four) times daily as needed (pain). 12/02/2020: #105/26 filled 11/13/2020 [facility] per PMP AWARE   losartan (COZAAR) 50 MG tablet Take 50 mg by mouth every morning.    Melatonin 5 MG CHEW Chew 10  mg by mouth at bedtime.    metFORMIN (GLUCOPHAGE) 500 MG tablet Take 500 mg by mouth 2 (two) times daily with a meal.    nitroGLYCERIN (NITROSTAT) 0.4 MG SL tablet Place 1 tablet (0.4 mg total) under the tongue every 5 (five) minutes as needed for chest pain. 12/02/2020: Pt took 2 tablets one day last week   pantoprazole (PROTONIX) 40 MG tablet Take 40 mg by mouth every morning.     potassium chloride (KLOR-CON) 10 MEQ tablet Take 10 mEq by mouth every morning.    sitaGLIPtin (JANUVIA) 100 MG tablet Take 100 mg by mouth every morning.    No facility-administered encounter medications on file as of 07/08/2021.    Patient Active Problem List   Diagnosis Date Noted   Stenosis of right internal carotid artery    CVA (cerebral vascular accident) ([place]) 12/02/2020   Hyperkalemia 12/02/2020   PAD (peripheral artery disease) ([place]) 05/08/2019   COPD ? GOLD stage/ active smoker 12/20/2018   Angina pectoris ([place]) 06/27/2018   Lumbar disc disease with radiculopathy 10/05/2017   Coronary artery disease involving native coronary artery of native heart with angina pectoris ([place]) 02/23/2017   Essential hypertension 02/23/2017   History of pulmonary embolism 02/23/2017   Neoplasm of larynx 09/29/2016   Chronic laryngitis 09/29/2016   RLS (restless legs syndrome) 07/28/2016   Arthritis 02/12/2016   Respiratory bronchiolitis interstitial lung disease ([place]) 02/12/2016   Moderate COPD (chronic obstructive pulmonary disease) ([place]) 02/12/2016   Acute respiratory failure ([place]) 01/27/2016   Anemia 01/27/2016   Hyponatremia 01/27/2016   Sepsis ([place]) 01/27/2016   Asthma 01/27/2016   S/P total knee replacement 01/20/2016   Atheroscler of native artery of both legs with intermit claudication ([place]) 12/16/2015   Bilateral carotid artery stenosis 12/16/2015   Post-traumatic osteoarthritis of right knee 07/25/2015   Cigarette smoker 05/07/2015   Diabetes mellitus due to underlying condition with unspecified complications ([place]) 02/72/5366   Dyslipidemia 05/07/2015   History of carotid endarterectomy 05/07/2015    Conditions to be addressed/monitored: CAD and COPD.  Limited Social Support, Level of Care Concerns, ADL/IADL Limitations, Social Isolation, Limited Access to Caregiver, Memory Deficits, and Lacks Knowledge of [employer].  Care Plan : LCSW Plan of Care  Updates made by  Francis Gaines, LCSW since 07/09/2021 12:00 AM     Problem: Maintain My Quality of Life through [place].   Priority: High     Goal: Maintain My Quality of Life through [place].   Start Date: 06/23/2021  Expected End Date: 08/22/2021  This Visit's Progress: On track  Recent Progress: On track  Priority: High  Note:   Current Barriers:   Patient with Chronic Obstructive Pulmonary Disease, Acute Respiratory Failure, Hypertension, Coronary Artery Disease and Cerebral Vascular Accident needs Support, Education, Referrals, Resources, Advocacy and Care Coordination to resolve unmet personal care needs in the home. Patient is unable to self-administer medications as prescribed or consistently perform ADL's/IADL's independently. Clinical Goals:  Patient will have [place] in place, through [employer].  Patient will demonstrate improved health management independence as evidenced by having in-home care services in place. Clinical Interventions: Collaboration with Primary Care Physician, Dr. Nicholos Johns regarding development and update of comprehensive plan of care as evidenced by provider attestation and co-signature. Inter-disciplinary care team collaboration (see longitudinal plan of care). Interventions performed:  Problem Solving Solutions identified and Personal Care Services encouraged.   Patient Goals/Self-Care Activities:  Confirmed with you, receipt of Personal  Care Services Application, Instructions, and Agency Provider List. Confirmed with you, that children plan to visit on 07/08/2021, to review and assist with completion of [place] Application, then submit to [employer] for processing. Work with [employer] on a monthly basis, until approved for Publishing rights manager through [employer].  Contact LCSW directly (# [id number]) if you have questions, need assistance, or if  additional social work needs are identified between now and our next scheduled telephone outreach call.  Follow-Up:  08/05/2021 at 9:00 am     Nat Christen, BSW, MSW, [place]  Licensed Clinical Social Worker  [place]  Mailing [place] [address] [place], [place] [address] Physical Address-[address], [place], [place] [address] Toll Free Main # [phone] Fax # [phone] Cell # [phone]  [email or web address]

## 2021-07-10 ENCOUNTER — Ambulatory Visit: Payer: Medicare Other | Admitting: *Deleted

## 2021-07-16 ENCOUNTER — Other Ambulatory Visit: Payer: Self-pay | Admitting: *Deleted

## 2021-07-16 NOTE — Patient Outreach (Signed)
[place] [facility]) Care Management  07/16/2021  Barbara French 02-24-1956 [id number]   Outgoing call placed to member, no answer, HIPAA compliant voice message left. Will follow up within the next 3-4 business days.  [name], [place], MSN [place] [phone]

## 2021-07-21 ENCOUNTER — Other Ambulatory Visit: Payer: Self-pay | Admitting: *Deleted

## 2021-07-21 DIAGNOSIS — E119 Type 2 diabetes mellitus without complications: Secondary | ICD-10-CM | POA: Diagnosis not present

## 2021-07-21 DIAGNOSIS — R948 Abnormal results of function studies of other organs and systems: Secondary | ICD-10-CM | POA: Diagnosis not present

## 2021-07-21 DIAGNOSIS — R1310 Dysphagia, unspecified: Secondary | ICD-10-CM | POA: Diagnosis not present

## 2021-07-21 DIAGNOSIS — R1013 Epigastric pain: Secondary | ICD-10-CM | POA: Diagnosis not present

## 2021-07-21 NOTE — Patient Outreach (Signed)
[place] [facility]) Care Management  07/21/2021  Barbara French 20-Mar-1956 [id number]   Outreach attempt #2 to both listed home and mobile numbers, unsuccessful, HIPAA compliant voice messages left.  Will send outreach letter and follow up within the next 3-4 business days.  [name], [place], MSN [place] [phone]

## 2021-07-25 ENCOUNTER — Other Ambulatory Visit: Payer: Self-pay | Admitting: *Deleted

## 2021-07-25 NOTE — Patient Outreach (Signed)
[place] [facility]) Care Management  07/25/2021  TERAH ROBEY 20-May-1956 [id number]   Outreach attempt #3, unsuccessful, HIPAA compliant voice message left.  Will make 4th and final attempt within the next 4 weeks, if remain unsuccessful will close case due to inability to maintain contact.  [name], RN, MSN, [place] Manager [phone]

## 2021-07-29 DIAGNOSIS — M545 Low back pain, unspecified: Secondary | ICD-10-CM | POA: Diagnosis not present

## 2021-07-29 DIAGNOSIS — G894 Chronic pain syndrome: Secondary | ICD-10-CM | POA: Diagnosis not present

## 2021-07-29 DIAGNOSIS — M5106 Intervertebral disc disorders with myelopathy, lumbar region: Secondary | ICD-10-CM | POA: Diagnosis not present

## 2021-07-31 ENCOUNTER — Other Ambulatory Visit: Payer: Self-pay | Admitting: *Deleted
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
## 2021-08-05 ENCOUNTER — Ambulatory Visit: Payer: Medicare Other | Admitting: *Deleted

## 2021-08-10 DIAGNOSIS — Z9181 History of falling: Secondary | ICD-10-CM

## 2021-08-10 DIAGNOSIS — F17210 Nicotine dependence, cigarettes, uncomplicated: Secondary | ICD-10-CM

## 2021-08-10 DIAGNOSIS — G4711 Idiopathic hypersomnia with long sleep time: Secondary | ICD-10-CM

## 2021-08-10 DIAGNOSIS — Z8616 Personal history of COVID-19: Secondary | ICD-10-CM | POA: Insufficient documentation

## 2021-08-10 DIAGNOSIS — E1140 Type 2 diabetes mellitus with diabetic neuropathy, unspecified: Secondary | ICD-10-CM

## 2021-08-10 DIAGNOSIS — Z7902 Long term (current) use of antithrombotics/antiplatelets: Secondary | ICD-10-CM | POA: Insufficient documentation

## 2021-08-10 DIAGNOSIS — Z7984 Long term (current) use of oral hypoglycemic drugs: Secondary | ICD-10-CM | POA: Insufficient documentation

## 2021-08-10 DIAGNOSIS — E1151 Type 2 diabetes mellitus with diabetic peripheral angiopathy without gangrene: Secondary | ICD-10-CM

## 2021-08-10 HISTORY — DX: Type 2 diabetes mellitus with diabetic peripheral angiopathy without gangrene: E11.51

## 2021-08-10 HISTORY — DX: Long term (current) use of oral hypoglycemic drugs: Z79.84

## 2021-08-10 HISTORY — DX: Type 2 diabetes mellitus with diabetic neuropathy, unspecified: E11.40

## 2021-08-10 HISTORY — DX: History of falling: Z91.81

## 2021-08-10 HISTORY — DX: Personal history of COVID-19: Z86.16

## 2021-08-10 HISTORY — DX: Idiopathic hypersomnia with long sleep time: G47.11

## 2021-08-10 HISTORY — DX: Long term (current) use of antithrombotics/antiplatelets: Z79.02

## 2021-08-10 HISTORY — DX: Nicotine dependence, cigarettes, uncomplicated: F17.210

## 2021-08-18 ENCOUNTER — Other Ambulatory Visit: Payer: Self-pay | Admitting: *Deleted

## 2021-08-18 NOTE — Patient Outreach (Signed)
[facility]) Care Management  [place]  08/18/2021   SOUA LENK 21-Nov-1955 [id number]   Outreach attempt #4, unsuccessful to member, HIPAA compliant voice message left on listed home number, listed mobile number voice mail box is full.  Call successful to daughter Valeria Batman, denies any urgent concerns, encouraged to contact this care manager with questions.    Encounter Medications:  Outpatient Encounter Medications as of 08/18/2021  Medication Sig Note   acetaminophen (TYLENOL) 500 MG tablet Take 1,000 mg by mouth 4 (four) times daily as needed for moderate pain or headache.    albuterol (VENTOLIN HFA) 108 (90 Base) MCG/ACT inhaler Inhale 1-2 puffs into the lungs 4 (four) times daily as needed for wheezing or shortness of breath.    amLODipine (NORVASC) 5 MG tablet Take 1 tablet by mouth every morning.    amoxicillin-clavulanate (AUGMENTIN) 875-125 MG tablet Take 1 tablet by mouth 2 (two) times daily. 12/02/2020: 7 day course started 11/28/2020   amphetamine-dextroamphetamine (ADDERALL) 15 MG tablet Take 15 mg by mouth daily as needed (when needs to be alert/awake; for MD appointments). 12/02/2020: #60/30 days filled 10/03/2020 [facility] per PMP AWARE   apixaban (ELIQUIS) 5 MG TABS tablet Take 5 mg by mouth 2 (two) times daily. 12/02/2020: On hold due to procedure scheduled for 12/04/2020   atorvastatin (LIPITOR) 40 MG tablet Take 40 mg by mouth every evening.    cilostazol (PLETAL) 50 MG tablet Take 100 mg by mouth 2 (two) times daily.    cyclobenzaprine (FLEXERIL) 10 MG tablet Take 10 mg by mouth 3 (three) times daily.    EPINEPHrine 0.3 mg/0.3 mL IJ SOAJ injection Inject 0.3 mg into the muscle once as needed for anaphylaxis.    escitalopram (LEXAPRO) 20 MG tablet Take 20 mg by mouth every morning.    gabapentin (NEURONTIN) 300 MG capsule Take 300 mg by mouth 3 (three) times daily.    HYDROmorphone (DILAUDID) 4 MG tablet Take 4 mg by mouth 4 (four) times daily as  needed (pain). 12/02/2020: #105/26 filled 11/13/2020 [facility] per PMP AWARE   losartan (COZAAR) 50 MG tablet Take 50 mg by mouth every morning.    Melatonin 5 MG CHEW Chew 10 mg by mouth at bedtime.    metFORMIN (GLUCOPHAGE) 500 MG tablet Take 500 mg by mouth 2 (two) times daily with a meal.    nitroGLYCERIN (NITROSTAT) 0.4 MG SL tablet Place 1 tablet (0.4 mg total) under the tongue every 5 (five) minutes as needed for chest pain. 12/02/2020: Pt took 2 tablets one day last week   pantoprazole (PROTONIX) 40 MG tablet Take 40 mg by mouth every morning.    potassium chloride (KLOR-CON) 10 MEQ tablet Take 10 mEq by mouth every morning.    sitaGLIPtin (JANUVIA) 100 MG tablet Take 100 mg by mouth every morning.    No facility-administered encounter medications on file as of 08/18/2021.    Functional Status:  In your present state of health, do you have any difficulty performing the following activities: 12/05/2020 12/02/2020  Hearing? N N  Vision? N Y  Difficulty concentrating or making decisions? N N  Walking or climbing stairs? - N  Dressing or bathing? - N  Doing errands, shopping? - Y  Some recent data might be hidden    Fall/Depression Screening: Fall Risk  06/24/2021 02/05/2021  Falls in the past year? 1 0  Number falls in past yr: 1 0  Injury with Fall? 1 0   PHQ 2/9 Scores 06/24/2021  12/09/2020  PHQ - 2 Score 0 1    Assessment:   Care Plan Care Plan : General Plan of Care (Adult)  Updates made by [name], RN since 08/18/2021 12:00 AM     Problem: Concern for safety due to recent fall   Priority: High     Long-Range Goal: Patient will not have any falls in the next 3 months Completed 08/18/2021  Start Date: 04/04/2021  Expected End Date: 07/04/2021  This Visit's Progress: Not on track  Recent Progress: On track  Priority: High  Note:   Current Barriers:  Knowledge Deficits related to plan of care for management of HTN, DMII, and fall  Care Coordination needs related to ADL  IADL limitations   11/1 - Referral placed to CSW to help with PCS application. Bunnie Pion, member report doing well.  RNCM Clinical Goal(s):  Patient will verbalize understanding of plan for management of HTN, DMII, and fall attend all scheduled medical appointments: PCP and Neuro work with community resource care guide to address needs related to ADL IADL limitations Medicaid process  through collaboration with Consulting civil engineer, provider, and care team.   Interventions: 1:1 collaboration with primary care provider regarding development and update of comprehensive plan of care as evidenced by provider attestation and co-signature Inter-disciplinary care team collaboration (see longitudinal plan of care) Evaluation of current treatment plan related to  self management and patient's adherence to plan as established by provider   Falls:  (Status: Goal on track: NO.)  Has had more falls since last outreach Reviewed medications and discussed potential side effects of medications such as dizziness and frequent urination Assessed for falls since last encounter Provided patient information for fall alert systems Screening for signs and symptoms of depression related to chronic disease state  Patient Goals/Self-Care Activities: Patient will attend all scheduled provider appointments Patient will continue to perform ADL's independently   08/18/2021 - Resolving due to duplicate goal       Problem: Care Coordination needs and knowledge deficit regarding management of chronic medical conditions (HTN, COPD, CVA, DM)   Priority: High     Long-Range Goal: Membe/family will verbalize adequate plan of care for management of chronic medical conditions   Start Date: 08/18/2021  Expected End Date: 02/14/2022  Priority: High  Note:   Current Barriers:  Knowledge Deficits related to plan of care for management of HTN, COPD, DMII, and  frequent falls  Care Coordination needs related to ADL IADL limitations   Lacks caregiver support.         RNCM Clinical Goal(s):  Patient will verbalize understanding of plan for management of HTN, COPD, DMII, and frequent falls as evidenced by family verbalized adequate management of care take all medications exactly as prescribed and will call provider for medication related questions as evidenced by member/family reported adherence    work with Education officer, museum to address ADL IADL limitations related to the management of HTN, COPD, DMII, and Stroke and frequent falls as evidenced by review of EMR and patient or Education officer, museum report     through collaboration with Consulting civil engineer, provider, and care team.   Interventions:  Inter-disciplinary care team collaboration (see longitudinal plan of care) Evaluation of current treatment plan related to  self management and patient's adherence to plan as established by provider   COPD: (Status: New goal.) Long Term Goal  Reviewed medications with patient, including use of prescribed maintenance and rescue inhalers, and provided instruction on medication management and the importance  of adherence Provided patient with basic written and verbal COPD education on self care/management/and exacerbation prevention Advised patient to track and manage COPD triggers Advised patient to self assesses COPD action plan zone and make appointment with provider if in the yellow zone for 48 hours without improvement  Diabetes:  (Status: New goal.) Short Term Goal   Lab Results  Component Value Date   HGBA1C 6.8 (H) 01/09/2016  Assessed patient's understanding of A1c goal: <6.5% Provided education to patient about basic DM disease process; Counseled on importance of regular laboratory monitoring as prescribed;        Discussed plans with patient for ongoing care management follow up and provided patient with direct contact information for care management team;      Provided patient with written educational materials related to hypo and  hyperglycemia and importance of correct treatment;        Falls:  (Status: New goal.) Long Term Goal  Provided written and verbal education re: potential causes of falls and Fall prevention strategies Advised patient of importance of notifying provider of falls Assessed for falls since last encounter  Hypertension: (Status: New goal.) Long Term Goal  Last practice recorded BP readings:  BP Readings from Last 3 Encounters:  02/06/21 138/73  12/06/20 124/61  01/01/20 128/78  Most recent eGFR/CrCl: No results found for: EGFR  No components found for: CRCL  Evaluation of current treatment plan related to hypertension self management and patient's adherence to plan as established by provider;   Reviewed medications with patient and discussed importance of compliance;  Discussed plans with patient for ongoing care management follow up and provided patient with direct contact information for care management team; Advised patient, providing education and rationale, to monitor blood pressure daily and record, calling PCP for findings outside established parameters;   Patient Goals/Self-Care Activities: check blood sugar at prescribed times: once daily enter blood sugar readings and medication or insulin into daily log take the blood sugar log to all doctor visits - avoid second hand smoke - limit outdoor activity during cold weather - eliminate symptom triggers at home - follow rescue plan if symptoms flare-up check blood pressure daily learn about high blood pressure keep a blood pressure log   Update 08/18/2021 - Unable to contact member, spoke with daughter [name].  State member is doing well, will have her call this RNCM back to provide further details of management of care.  Daughter confirms that member remains in need of additional assistance in the home as family works and member is home during the day alone.  Aware that CSW has been attempted to contact member as well without success,  will have CSW contact daughter.         Goals Addressed             This Visit's Progress    COMPLETED: THN - Prevent Falls and Injury evidenced by no reported falls       Timeframe:  Short-Term Goal Priority:  High Start Date:            8/26                 Expected End Date:      11/26                 Barriers: Knowledge Visual   - always use handrails on the stairs - always wear low-heeled or flat shoes or slippers with nonskid soles - learn how to get back up  if I fall - use a cane or walker - wear my glasses and/or hearing aid    Why is this important?   Most falls happen when it is hard for you to walk safely. Your balance may be off because of an illness. You may have pain in your knees, hip or other joints.  You may be overly tired or taking medicines that make you sleepy. You may not be able to see or hear clearly.  Falls can lead to broken bones, bruises or other injuries.  There are things you can do to help prevent falling.     Notes:   8/26 - report recent issues with vertigo and a fall yesterday.  State Vertigo is an old problem, will ask PCP for medication today.  Inquired about the need for PT, state she does not want services  9/27 - State she has had another fall last week.  Looking forward to selling her home so she can move to a one level home that is safer.  Continues to decline offer to have PT started.  She feels she will be better once she has an aide in the home.  11/1 - Report she has had more falls.  Now has Medicaid, interested in aide services to help during the day. Feels this will decrease fall risk  08/18/2021 - Resolving due to duplicate goal         Plan:  Follow-up: Patient agrees to Care Plan and Follow-up. Follow-up in 1 month(s).  [name], RN, MSN, [employer] Manager [phone]

## 2021-08-22 ENCOUNTER — Ambulatory Visit: Payer: Self-pay | Admitting: *Deleted

## 2021-08-26 DIAGNOSIS — F17210 Nicotine dependence, cigarettes, uncomplicated: Secondary | ICD-10-CM | POA: Diagnosis not present

## 2021-08-26 DIAGNOSIS — D126 Benign neoplasm of colon, unspecified: Secondary | ICD-10-CM | POA: Diagnosis not present

## 2021-08-26 DIAGNOSIS — K219 Gastro-esophageal reflux disease without esophagitis: Secondary | ICD-10-CM | POA: Diagnosis not present

## 2021-08-26 DIAGNOSIS — Z1211 Encounter for screening for malignant neoplasm of colon: Secondary | ICD-10-CM | POA: Diagnosis not present

## 2021-08-26 DIAGNOSIS — J449 Chronic obstructive pulmonary disease, unspecified: Secondary | ICD-10-CM | POA: Diagnosis not present

## 2021-08-26 DIAGNOSIS — K635 Polyp of colon: Secondary | ICD-10-CM | POA: Diagnosis not present

## 2021-08-26 DIAGNOSIS — K3189 Other diseases of stomach and duodenum: Secondary | ICD-10-CM | POA: Diagnosis not present

## 2021-08-26 DIAGNOSIS — D122 Benign neoplasm of ascending colon: Secondary | ICD-10-CM | POA: Diagnosis not present

## 2021-08-26 DIAGNOSIS — E119 Type 2 diabetes mellitus without complications: Secondary | ICD-10-CM | POA: Diagnosis not present

## 2021-08-26 DIAGNOSIS — R1310 Dysphagia, unspecified: Secondary | ICD-10-CM | POA: Diagnosis not present

## 2021-08-26 DIAGNOSIS — K2950 Unspecified chronic gastritis without bleeding: Secondary | ICD-10-CM | POA: Diagnosis not present

## 2021-09-01 ENCOUNTER — Other Ambulatory Visit: Payer: Self-pay | Admitting: *Deleted

## 2021-09-01 NOTE — Patient Outreach (Signed)
Care Management Clinical Social Work Note  09/01/2021 Name: Barbara French MRN: [id number] DOB: 07/28/1956  Barbara French is a 66 y.o. year old female who is a primary care patient of Barbara Johns, MD.  The Care Management team was consulted for assistance with chronic disease management and coordination needs.  Engaged with patient by telephone for follow up visit in response to provider referral for social work chronic care management and care coordination services  Consent to Services:  Barbara French was given information about Care Management services today including:  Care Management services includes personalized support from designated clinical staff supervised by her physician, including individualized plan of care and coordination with other care providers 24/7 contact phone numbers for assistance for urgent and routine care needs. The patient may stop case management services at any time by phone call to the office staff.  Patient agreed to services and consent obtained.   Assessment: Review of patient past medical history, allergies, medications, and health status, including review of relevant consultants reports was performed today as part of a comprehensive evaluation and provision of chronic care management and care coordination services.  SDOH (Social Determinants of Health) assessments and interventions performed:    Advanced Directives Status: Not addressed in this encounter.  Care Plan  Allergies  Allergen Reactions   Bee Venom Anaphylaxis    Yellow jackets-Throat closed up    Cortisone Other (See Comments), Hives and Swelling    Injection caused temporary paralysis  Shots   Peanut Oil Other (See Comments)    Allergy test showed positive never had a reaction. Keeps epipen with her Identified by allergy test, never had a reaction    Outpatient Encounter Medications as of 09/01/2021  Medication Sig Note   acetaminophen (TYLENOL) 500 MG tablet Take 1,000 mg by mouth 4  (four) times daily as needed for moderate pain or headache.    albuterol (VENTOLIN HFA) 108 (90 Base) MCG/ACT inhaler Inhale 1-2 puffs into the lungs 4 (four) times daily as needed for wheezing or shortness of breath.    amLODipine (NORVASC) 5 MG tablet Take 1 tablet by mouth every morning.    amoxicillin-clavulanate (AUGMENTIN) 875-125 MG tablet Take 1 tablet by mouth 2 (two) times daily. 12/02/2020: 7 day course started 11/28/2020   amphetamine-dextroamphetamine (ADDERALL) 15 MG tablet Take 15 mg by mouth daily as needed (when needs to be alert/awake; for MD appointments). 12/02/2020: #60/30 days filled 10/03/2020 [facility] per PMP AWARE   apixaban (ELIQUIS) 5 MG TABS tablet Take 5 mg by mouth 2 (two) times daily. 12/02/2020: On hold due to procedure scheduled for 12/04/2020   atorvastatin (LIPITOR) 40 MG tablet Take 40 mg by mouth every evening.    cilostazol (PLETAL) 50 MG tablet Take 100 mg by mouth 2 (two) times daily.    cyclobenzaprine (FLEXERIL) 10 MG tablet Take 10 mg by mouth 3 (three) times daily.    EPINEPHrine 0.3 mg/0.3 mL IJ SOAJ injection Inject 0.3 mg into the muscle once as needed for anaphylaxis.    escitalopram (LEXAPRO) 20 MG tablet Take 20 mg by mouth every morning.    gabapentin (NEURONTIN) 300 MG capsule Take 300 mg by mouth 3 (three) times daily.    HYDROmorphone (DILAUDID) 4 MG tablet Take 4 mg by mouth 4 (four) times daily as needed (pain). 12/02/2020: #105/26 filled 11/13/2020 [facility] per PMP AWARE   losartan (COZAAR) 50 MG tablet Take 50 mg by mouth every morning.    Melatonin 5 MG CHEW Chew 10  mg by mouth at bedtime.    metFORMIN (GLUCOPHAGE) 500 MG tablet Take 500 mg by mouth 2 (two) times daily with a meal.    nitroGLYCERIN (NITROSTAT) 0.4 MG SL tablet Place 1 tablet (0.4 mg total) under the tongue every 5 (five) minutes as needed for chest pain. 12/02/2020: Pt took 2 tablets one day last week   pantoprazole (PROTONIX) 40 MG tablet Take 40 mg by mouth every morning.     potassium chloride (KLOR-CON) 10 MEQ tablet Take 10 mEq by mouth every morning.    sitaGLIPtin (JANUVIA) 100 MG tablet Take 100 mg by mouth every morning.    No facility-administered encounter medications on file as of 09/01/2021.    Patient Active Problem List   Diagnosis Date Noted   Stenosis of right internal carotid artery    CVA (cerebral vascular accident) ([place]) 12/02/2020   Hyperkalemia 12/02/2020   PAD (peripheral artery disease) ([place]) 05/08/2019   COPD ? GOLD stage/ active smoker 12/20/2018   Angina pectoris ([place]) 06/27/2018   Lumbar disc disease with radiculopathy 10/05/2017   Coronary artery disease involving native coronary artery of native heart with angina pectoris ([place]) 02/23/2017   Essential hypertension 02/23/2017   History of pulmonary embolism 02/23/2017   Neoplasm of larynx 09/29/2016   Chronic laryngitis 09/29/2016   RLS (restless legs syndrome) 07/28/2016   Arthritis 02/12/2016   Respiratory bronchiolitis interstitial lung disease ([place]) 02/12/2016   Moderate COPD (chronic obstructive pulmonary disease) ([place]) 02/12/2016   Acute respiratory failure ([place]) 01/27/2016   Anemia 01/27/2016   Hyponatremia 01/27/2016   Sepsis ([place]) 01/27/2016   Asthma 01/27/2016   S/P total knee replacement 01/20/2016   Atheroscler of native artery of both legs with intermit claudication ([place]) 12/16/2015   Bilateral carotid artery stenosis 12/16/2015   Post-traumatic osteoarthritis of right knee 07/25/2015   Cigarette smoker 05/07/2015   Diabetes mellitus due to underlying condition with unspecified complications ([place]) 44/08/270   Dyslipidemia 05/07/2015   History of carotid endarterectomy 05/07/2015    Conditions to be addressed/monitored: COPD, HTN, and CAD.  Film/video editor, Limited Social Support, Level of Care Concerns, ADL/IADL Limitations, Social Isolation, Limited Access to Building control surveyor, and Teacher, English as a foreign language of [employer].  Care Plan : LCSW Plan of Care   Updates made by Barbara Gaines, LCSW since 09/01/2021 12:00 AM     Problem: Maintain My Quality of Life through [place].   Priority: High     Goal: Maintain My Quality of Life through [place].   Start Date: 06/23/2021  Expected End Date: 09/22/2021  This Visit's Progress: On track  Recent Progress: On track  Priority: High  Note:   Current Barriers:   Patient with Chronic Obstructive Pulmonary Disease, Acute Respiratory Failure, Hypertension, Coronary Artery Disease and Cerebral Vascular Accident needs Support, Education, Referrals, Resources, Advocacy and Care Coordination to resolve unmet personal care needs in the home. Patient is unable to self-administer medications as prescribed or consistently perform ADL's/IADL's independently. Clinical Goals:  Patient will have [place] in place, through [employer].  Patient will demonstrate improved health management independence as evidenced by having in-home care services in place. Clinical Interventions: Collaboration with Primary Care Physician, Dr. Nicholos French regarding development and update of comprehensive plan of care as evidenced by provider attestation and co-signature. Inter-disciplinary care team collaboration (see longitudinal plan of care). Interventions performed:  Problem Solving Solutions identified, Verification of Active Adult Medicaid Coverage, and Personal Care Services encouraged.   Patient Goals/Self-Care Activities:  Work with [employer] on a monthly basis, until approved for Adult Medicaid, through the [place], as well as Publishing rights manager, through [employer].  LCSW collaboration with Truman Hayward, Adult Medicaid Case Worker with the [place] [phone]), to inquire about active Adult Medicaid coverage, and to confirm that Medicaid card has been issued and mailed to  your home.  ~ HIPAA compliant message left on voicemail for Truman Hayward, as LCSW awaits a return call. Confirmed with you, receipt of Personal Care Services Application, Instructions, and Agency Provider List, but misplaced.    ~ [place] Application, Instructions, and Agency Provider List mailed out again on 09/01/2021. Contact LCSW directly (# [id number]) if you have questions, need assistance, or if additional social work needs are identified between now and our next scheduled telephone outreach call.  Follow-Up:  09/22/2021 at 9:00 am     Nat Christen, BSW, MSW, [place]  Licensed Clinical Social Worker  [place]  Mailing [place] [address] [place], [place] [address] Physical Address-[address] [place],  [address] Toll Free Main # [phone] Fax # [phone] Cell # [phone]  [email or web address]

## 2021-09-02 DIAGNOSIS — M5106 Intervertebral disc disorders with myelopathy, lumbar region: Secondary | ICD-10-CM | POA: Diagnosis not present

## 2021-09-02 DIAGNOSIS — G894 Chronic pain syndrome: Secondary | ICD-10-CM | POA: Diagnosis not present

## 2021-09-02 DIAGNOSIS — M545 Low back pain, unspecified: Secondary | ICD-10-CM | POA: Diagnosis not present

## 2021-09-10 DIAGNOSIS — Z8673 Personal history of transient ischemic attack (TIA), and cerebral infarction without residual deficits: Secondary | ICD-10-CM | POA: Diagnosis not present

## 2021-09-10 DIAGNOSIS — J449 Chronic obstructive pulmonary disease, unspecified: Secondary | ICD-10-CM | POA: Diagnosis not present

## 2021-09-10 DIAGNOSIS — I2510 Atherosclerotic heart disease of native coronary artery without angina pectoris: Secondary | ICD-10-CM | POA: Diagnosis not present

## 2021-09-10 DIAGNOSIS — R9431 Abnormal electrocardiogram [ECG] [EKG]: Secondary | ICD-10-CM | POA: Diagnosis not present

## 2021-09-10 DIAGNOSIS — E1151 Type 2 diabetes mellitus with diabetic peripheral angiopathy without gangrene: Secondary | ICD-10-CM | POA: Diagnosis not present

## 2021-09-10 DIAGNOSIS — R410 Disorientation, unspecified: Secondary | ICD-10-CM | POA: Diagnosis not present

## 2021-09-10 DIAGNOSIS — I517 Cardiomegaly: Secondary | ICD-10-CM | POA: Diagnosis not present

## 2021-09-10 DIAGNOSIS — R778 Other specified abnormalities of plasma proteins: Secondary | ICD-10-CM | POA: Diagnosis not present

## 2021-09-10 DIAGNOSIS — Z20822 Contact with and (suspected) exposure to covid-19: Secondary | ICD-10-CM | POA: Diagnosis not present

## 2021-09-10 DIAGNOSIS — E785 Hyperlipidemia, unspecified: Secondary | ICD-10-CM | POA: Diagnosis not present

## 2021-09-10 DIAGNOSIS — E875 Hyperkalemia: Secondary | ICD-10-CM | POA: Diagnosis not present

## 2021-09-10 DIAGNOSIS — R569 Unspecified convulsions: Secondary | ICD-10-CM | POA: Diagnosis not present

## 2021-09-10 DIAGNOSIS — Z86711 Personal history of pulmonary embolism: Secondary | ICD-10-CM | POA: Diagnosis not present

## 2021-09-10 DIAGNOSIS — R079 Chest pain, unspecified: Secondary | ICD-10-CM | POA: Diagnosis not present

## 2021-09-10 DIAGNOSIS — I361 Nonrheumatic tricuspid (valve) insufficiency: Secondary | ICD-10-CM | POA: Diagnosis not present

## 2021-09-10 DIAGNOSIS — I214 Non-ST elevation (NSTEMI) myocardial infarction: Secondary | ICD-10-CM | POA: Diagnosis not present

## 2021-09-10 DIAGNOSIS — Z9889 Other specified postprocedural states: Secondary | ICD-10-CM | POA: Diagnosis not present

## 2021-09-10 DIAGNOSIS — K219 Gastro-esophageal reflux disease without esophagitis: Secondary | ICD-10-CM | POA: Diagnosis not present

## 2021-09-10 DIAGNOSIS — I6529 Occlusion and stenosis of unspecified carotid artery: Secondary | ICD-10-CM | POA: Diagnosis not present

## 2021-09-10 DIAGNOSIS — F17200 Nicotine dependence, unspecified, uncomplicated: Secondary | ICD-10-CM | POA: Diagnosis not present

## 2021-09-10 DIAGNOSIS — I10 Essential (primary) hypertension: Secondary | ICD-10-CM | POA: Diagnosis not present

## 2021-09-10 DIAGNOSIS — Z955 Presence of coronary angioplasty implant and graft: Secondary | ICD-10-CM | POA: Diagnosis not present

## 2021-09-10 DIAGNOSIS — R400 Somnolence: Secondary | ICD-10-CM | POA: Diagnosis not present

## 2021-09-10 DIAGNOSIS — I739 Peripheral vascular disease, unspecified: Secondary | ICD-10-CM | POA: Diagnosis not present

## 2021-09-10 DIAGNOSIS — G319 Degenerative disease of nervous system, unspecified: Secondary | ICD-10-CM | POA: Diagnosis not present

## 2021-09-10 DIAGNOSIS — E871 Hypo-osmolality and hyponatremia: Secondary | ICD-10-CM | POA: Diagnosis not present

## 2021-09-10 DIAGNOSIS — I371 Nonrheumatic pulmonary valve insufficiency: Secondary | ICD-10-CM | POA: Diagnosis not present

## 2021-09-10 DIAGNOSIS — G3184 Mild cognitive impairment, so stated: Secondary | ICD-10-CM | POA: Diagnosis not present

## 2021-09-10 DIAGNOSIS — E119 Type 2 diabetes mellitus without complications: Secondary | ICD-10-CM | POA: Diagnosis not present

## 2021-09-10 DIAGNOSIS — I509 Heart failure, unspecified: Secondary | ICD-10-CM | POA: Diagnosis not present

## 2021-09-10 DIAGNOSIS — I272 Pulmonary hypertension, unspecified: Secondary | ICD-10-CM | POA: Diagnosis not present

## 2021-09-15 ENCOUNTER — Other Ambulatory Visit: Payer: Self-pay | Admitting: *Deleted

## 2021-09-15 NOTE — Patient Outreach (Addendum)
[facility]) Care Management  09/15/2021  TAWAN CORKERN 08/06/1956 [id number]   Noted that member was admitted to [facility] 2/1-2/4 for chest pain, diagnosed with NSTEMI.  Outgoing calls placed to member's preferred number, listed home number, and daughter's number ([name]), all unsuccessful.  HIPAA compliant voice messages left, will follow up within the next 3-4 business days.  [name], RN, MSN, [place] Manager [phone]

## 2021-09-17 ENCOUNTER — Telehealth: Payer: Self-pay | Admitting: Cardiology

## 2021-09-17 DIAGNOSIS — I69315 Cognitive social or emotional deficit following cerebral infarction: Secondary | ICD-10-CM | POA: Diagnosis not present

## 2021-09-17 DIAGNOSIS — I214 Non-ST elevation (NSTEMI) myocardial infarction: Secondary | ICD-10-CM | POA: Diagnosis not present

## 2021-09-17 DIAGNOSIS — Z86718 Personal history of other venous thrombosis and embolism: Secondary | ICD-10-CM | POA: Diagnosis not present

## 2021-09-17 DIAGNOSIS — G4711 Idiopathic hypersomnia with long sleep time: Secondary | ICD-10-CM | POA: Diagnosis not present

## 2021-09-17 DIAGNOSIS — F17210 Nicotine dependence, cigarettes, uncomplicated: Secondary | ICD-10-CM | POA: Diagnosis not present

## 2021-09-17 DIAGNOSIS — Z7984 Long term (current) use of oral hypoglycemic drugs: Secondary | ICD-10-CM | POA: Diagnosis not present

## 2021-09-17 DIAGNOSIS — I10 Essential (primary) hypertension: Secondary | ICD-10-CM | POA: Diagnosis not present

## 2021-09-17 DIAGNOSIS — E1140 Type 2 diabetes mellitus with diabetic neuropathy, unspecified: Secondary | ICD-10-CM | POA: Diagnosis not present

## 2021-09-17 DIAGNOSIS — K219 Gastro-esophageal reflux disease without esophagitis: Secondary | ICD-10-CM | POA: Diagnosis not present

## 2021-09-17 DIAGNOSIS — Z9181 History of falling: Secondary | ICD-10-CM | POA: Diagnosis not present

## 2021-09-17 DIAGNOSIS — J449 Chronic obstructive pulmonary disease, unspecified: Secondary | ICD-10-CM | POA: Diagnosis not present

## 2021-09-17 DIAGNOSIS — I2510 Atherosclerotic heart disease of native coronary artery without angina pectoris: Secondary | ICD-10-CM | POA: Diagnosis not present

## 2021-09-17 DIAGNOSIS — G4733 Obstructive sleep apnea (adult) (pediatric): Secondary | ICD-10-CM | POA: Diagnosis not present

## 2021-09-17 DIAGNOSIS — Z8673 Personal history of transient ischemic attack (TIA), and cerebral infarction without residual deficits: Secondary | ICD-10-CM | POA: Diagnosis not present

## 2021-09-17 DIAGNOSIS — Z8616 Personal history of COVID-19: Secondary | ICD-10-CM | POA: Diagnosis not present

## 2021-09-17 DIAGNOSIS — E559 Vitamin D deficiency, unspecified: Secondary | ICD-10-CM | POA: Diagnosis not present

## 2021-09-17 DIAGNOSIS — E1151 Type 2 diabetes mellitus with diabetic peripheral angiopathy without gangrene: Secondary | ICD-10-CM | POA: Diagnosis not present

## 2021-09-17 DIAGNOSIS — Z86711 Personal history of pulmonary embolism: Secondary | ICD-10-CM | POA: Diagnosis not present

## 2021-09-17 DIAGNOSIS — Z7901 Long term (current) use of anticoagulants: Secondary | ICD-10-CM | POA: Diagnosis not present

## 2021-09-17 NOTE — Telephone Encounter (Signed)
Patient called in with her significant other on the line as well and he did most of the talking on her behalf because she was recently discharged. He states due to recent "cardiac event" patient went to the hospital and they wanted to inform Dr. Geraldo Pitter of this. They also scheduled an appointment for 4/25 with Dr. Geraldo Pitter.

## 2021-09-18 ENCOUNTER — Other Ambulatory Visit: Payer: Self-pay | Admitting: *Deleted

## 2021-09-18 NOTE — Patient Instructions (Signed)
Visit Information  Thank you for taking time to visit with me today. Please don't hesitate to contact me if I can be of assistance to you before our next scheduled telephone appointment.  Following are the goals we discussed today:  Check blood pressure and blood sugar daily, record readings. Follow diabetic and heart healthy diet.  Our next appointment is by telephone in 3-4 weeks  The patient verbalized understanding of instructions, educational materials, and care plan provided today and agreed to receive a mailed copy of patient instructions, educational materials, and care plan.   The patient has been provided with contact information for the care management team and has been advised to call with any health related questions or concerns.   [name], RN, MSN, [place] Manager [phone]

## 2021-09-18 NOTE — Patient Outreach (Signed)
[facility]) Care Management Telephonic RN Care Manager Note   09/18/2021 Name:  Barbara French MRN:  [id number] DOB:  [birth year]  Summary: Outreach attempt #2 placed to member on listed cell phone number, successful.  Denies any urgent concerns, encouraged to contact this care manager with questions.    Recommendations/Changes made from today's visit: Follow heart healthy and diabetic diet.  Pick up glucose meter and check blood sugars and blood pressure daily.  Subjective: Barbara French is an 66 y.o. year old female who is a primary patient of Nicholos Johns, MD. The care management team was consulted for assistance with care management and/or care coordination needs.    Telephonic RN Care Manager completed Telephone Visit today.  Objective:   Medications Reviewed Today     Reviewed by Francis Gaines, LCSW (Social Worker) on 09/01/21 at 22  Med List Status: <None>   Medication Order Taking? Sig Documenting Provider Last Dose Status Informant  acetaminophen (TYLENOL) 500 MG tablet [id number] No Take 1,000 mg by mouth 4 (four) times daily as needed for moderate pain or headache. [provider] 12/01/2020 Unknown time Active Care Giver  albuterol (VENTOLIN HFA) 108 (90 Base) MCG/ACT inhaler [id number] No Inhale 1-2 puffs into the lungs 4 (four) times daily as needed for wheezing or shortness of breath. [provider] unknown Active Care Giver  amLODipine (NORVASC) 5 MG tablet [id number] No Take 1 tablet by mouth every morning. [provider] 12/02/2020 am Active Care Giver  amoxicillin-clavulanate (AUGMENTIN) 875-125 MG tablet [id number] No Take 1 tablet by mouth 2 (two) times daily. [provider] 12/02/2020 am Active Care Giver           Med Note Orvan Seen, HEATHER L   Mon Dec 02, 2020  5:46 PM) 7 day course started 11/28/2020  amphetamine-dextroamphetamine (ADDERALL) 15 MG tablet [id number] No Take 15 mg by mouth daily as needed (when  needs to be alert/awake; for MD appointments). [provider] 12/01/2020 Unknown time Active Care Giver           Med Note Orvan Seen, HEATHER L   Mon Dec 02, 2020  5:19 PM) #60/30 days filled 10/03/2020 [facility] per PMP AWARE  apixaban (ELIQUIS) 5 MG TABS tablet [id number] No Take 5 mg by mouth 2 (two) times daily. [provider] 11/30/2020 9-11am Active Care Giver           Med Note Orvan Seen, HEATHER L   Mon Dec 02, 2020  5:49 PM) On hold due to procedure scheduled for 12/04/2020  atorvastatin (LIPITOR) 40 MG tablet [id number] No Take 40 mg by mouth every evening. [provider] 12/01/2020 pm Active Care Giver  cilostazol (PLETAL) 50 MG tablet [id number] No Take 100 mg by mouth 2 (two) times daily. [provider] 12/02/2020 am Active Care Giver  cyclobenzaprine (FLEXERIL) 10 MG tablet [id number] No Take 10 mg by mouth 3 (three) times daily. [provider] 12/02/2020 am Active Care Giver  EPINEPHrine 0.3 mg/0.3 mL IJ SOAJ injection [id number] No Inject 0.3 mg into the muscle once as needed for anaphylaxis. [provider] unknown Active Care Giver  escitalopram (LEXAPRO) 20 MG tablet [id number] No Take 20 mg by mouth every morning. [provider] 12/02/2020 am Active Care Giver  gabapentin (NEURONTIN) 300 MG capsule [id number] No Take 300 mg by mouth 3 (three) times daily. [provider] 12/02/2020 am Active Care Giver  HYDROmorphone (DILAUDID) 4 MG tablet [id number] No Take 4 mg by mouth 4 (four)  times daily as needed (pain). [provider] Past Week Unknown time Active Care Giver           Med Note Rosine Beat Dec 02, 2020  5:20 PM) #105/26 filled 11/13/2020 [facility] per PMP AWARE  losartan (COZAAR) 50 MG tablet [id number] No Take 50 mg by mouth every morning. [provider] 12/02/2020 am Active Care Giver  Melatonin 5 MG CHEW [id number] No Chew 10 mg by mouth at bedtime. [provider] 12/01/2020 pm  Active Care Giver  metFORMIN (GLUCOPHAGE) 500 MG tablet [id number] No Take 500 mg by mouth 2 (two) times daily with a meal. [provider] 12/02/2020 am Active Care Giver  nitroGLYCERIN (NITROSTAT) 0.4 MG SL tablet [id number] No Place 1 tablet (0.4 mg total) under the tongue every 5 (five) minutes as needed for chest pain. Revankar, Reita Cliche, MD Past Week Unknown time Active Care Giver           Med Note Orvan Seen, Sharlette Dense   Mon Dec 02, 2020  5:54 PM) Pt took 2 tablets one day last week  pantoprazole (PROTONIX) 40 MG tablet [id number] No Take 40 mg by mouth every morning. [provider] 12/02/2020 am Active Care Giver  potassium chloride (KLOR-CON) 10 MEQ tablet [id number] No Take 10 mEq by mouth every morning. [provider] 12/02/2020 am Active Care Giver  sitaGLIPtin (JANUVIA) 100 MG tablet [id number] No Take 100 mg by mouth every morning. [provider] 12/02/2020 am Active Care Giver  Med List Note Payton Doughty, CPhT 12/02/20 1716): Live in caregiver - [place] [phone]             SDOH:  (Social Determinants of Health) assessments and interventions performed:     Care Plan  Review of patient past medical history, allergies, medications, health status, including review of consultants reports, laboratory and other test data, was performed as part of comprehensive evaluation for care management services.   Care Plan : General Plan of Care (Adult)  Updates made by [name], RN since 09/18/2021 12:00 AM     Problem: Care Coordination needs and knowledge deficit regarding management of chronic medical conditions (HTN, COPD, CVA, DM)   Priority: High     Long-Range Goal: Membe/family will verbalize adequate plan of care for management of chronic medical conditions   Start Date: 08/18/2021  Expected End Date: 02/14/2022  This Visit's Progress: On track  Priority: High  Note:   Current Barriers:  Knowledge Deficits related to plan of care for  management of HTN, COPD, DMII, and  frequent falls  Care Coordination needs related to ADL IADL limitations  Lacks caregiver support.         RNCM Clinical Goal(s):  Patient will verbalize understanding of plan for management of HTN, COPD, DMII, and frequent falls as evidenced by family verbalized adequate management of care take all medications exactly as prescribed and will call provider for medication related questions as evidenced by member/family reported adherence    work with Education officer, museum to address ADL IADL limitations related to the management of HTN, COPD, DMII, and Stroke and frequent falls as evidenced by review of EMR and patient or Education officer, museum report     through collaboration with Consulting civil engineer, provider, and care team.   Interventions:  Inter-disciplinary care team collaboration (see longitudinal plan of care) Evaluation of current treatment plan related to  self management and patient's adherence to plan as established by provider   COPD: (  Status: Goal on Track (progressing): YES.) Long Term Goal  Reviewed medications with patient, including use of prescribed maintenance and rescue inhalers, and provided instruction on medication management and the importance of adherence Provided patient with basic written and verbal COPD education on self care/management/and exacerbation prevention Advised patient to track and manage COPD triggers Advised patient to self assesses COPD action plan zone and make appointment with provider if in the yellow zone for 48 hours without improvement  Diabetes:  (Status: Goal on Track (progressing): YES.) Short Term Goal   Lab Results  Component Value Date   HGBA1C 6.8 (H) 01/09/2016  Assessed patient's understanding of A1c goal: <6.5% Provided education to patient about basic DM disease process; Counseled on importance of regular laboratory monitoring as prescribed;        Discussed plans with patient for ongoing care management follow up  and provided patient with direct contact information for care management team;      Provided patient with written educational materials related to hypo and hyperglycemia and importance of correct treatment;       A1C 6.7 as of 04/23/2021  Falls:  (Status: Goal on Track (progressing): YES.) Long Term Goal  Provided written and verbal education re: potential causes of falls and Fall prevention strategies Advised patient of importance of notifying provider of falls Assessed for falls since last encounter  Hypertension: (Status: Goal on Track (progressing): YES.) Long Term Goal  Last practice recorded BP readings:  BP Readings from Last 3 Encounters:  02/06/21 138/73  12/06/20 124/61  01/01/20 128/78  Most recent eGFR/CrCl: No results found for: EGFR  No components found for: CRCL  Evaluation of current treatment plan related to hypertension self management and patient's adherence to plan as established by provider;   Reviewed medications with patient and discussed importance of compliance;  Discussed plans with patient for ongoing care management follow up and provided patient with direct contact information for care management team; Advised patient, providing education and rationale, to monitor blood pressure daily and record, calling PCP for findings outside established parameters;   Patient Goals/Self-Care Activities: check blood sugar at prescribed times: once daily enter blood sugar readings and medication or insulin into daily log take the blood sugar log to all doctor visits - avoid second hand smoke - limit outdoor activity during cold weather - eliminate symptom triggers at home - follow rescue plan if symptoms flare-up check blood pressure daily learn about high blood pressure keep a blood pressure log   Update 08/18/2021 - Unable to contact member, spoke with daughter [name].  State member is doing well, will have her call this RNCM back to provide further details of management  of care.  Daughter confirms that member remains in need of additional assistance in the home as family works and member is home during the day alone.  Aware that CSW has been attempted to contact member as well without success, will have CSW contact daughter.   Update 2/9 - Spoke to member, state she no longer lives with her daughter.  She now lives alone and has a live in boyfriend.  She is still asking for in home assistance as the boyfriend works during the day.  Advised that CSW is scheduled to make contact with her next week to follow up on PCS application.  She was hospitalized last month for chest pain at [facility], per notes no stent needed post cath.  She has follow up with PCP in the next 2 weeks  and with cardiology on 4/25.  Admits that she is not monitoring blood pressure or blood sugar daily, glucose meter broke.  State a new one has been ordered, waiting to be picked up from pharmacy.  Reminded to stay adherent to diabetic and heart healthy diet, will send education.        Plan:  Telephone follow up appointment with care management team member scheduled for:  3-4 weeks. The patient has been provided with contact information for the care management team and has been advised to call with any health related questions or concerns.   [name], RN, MSN, [place] Manager [phone]

## 2021-09-19 ENCOUNTER — Ambulatory Visit: Payer: Medicare Other | Admitting: *Deleted

## 2021-09-22 ENCOUNTER — Other Ambulatory Visit: Payer: Self-pay | Admitting: *Deleted

## 2021-09-22 NOTE — Patient Outreach (Signed)
Care Management Clinical Social Work Note  09/22/2021 Name: Barbara French MRN: [id number] DOB: Dec 10, 1955  Barbara French is a 66 y.o. year old female who is a primary care patient of Barbara Johns, MD.  The Care Management team was consulted for assistance with chronic disease management and coordination needs.  Engaged with patient by telephone for follow up visit in response to provider referral for social work chronic care management and care coordination services  Consent to Services:  Barbara French was given information about Care Management services today including:  Care Management services includes personalized support from designated clinical staff supervised by her physician, including individualized plan of care and coordination with other care providers 24/7 contact phone numbers for assistance for urgent and routine care needs. The patient may stop case management services at any time by phone call to the office staff.  Patient agreed to services and consent obtained.   Assessment: Review of patient past medical history, allergies, medications, and health status, including review of relevant consultants reports was performed today as part of a comprehensive evaluation and provision of chronic care management and care coordination services.  SDOH (Social Determinants of Health) assessments and interventions performed:    Advanced Directives Status: Not addressed in this encounter.  Care Plan  Allergies  Allergen Reactions   Bee Venom Anaphylaxis    Yellow jackets-Throat closed up    Cortisone Other (See Comments), Hives and Swelling    Injection caused temporary paralysis  Shots   Peanut Oil Other (See Comments)    Allergy test showed positive never had a reaction. Keeps epipen with her Identified by allergy test, never had a reaction    Outpatient Encounter Medications as of 09/22/2021  Medication Sig Note   acetaminophen (TYLENOL) 500 MG tablet Take 1,000 mg by mouth 4  (four) times daily as needed for moderate pain or headache.    albuterol (VENTOLIN HFA) 108 (90 Base) MCG/ACT inhaler Inhale 1-2 puffs into the lungs 4 (four) times daily as needed for wheezing or shortness of breath.    amLODipine (NORVASC) 5 MG tablet Take 1 tablet by mouth every morning.    amoxicillin-clavulanate (AUGMENTIN) 875-125 MG tablet Take 1 tablet by mouth 2 (two) times daily. 12/02/2020: 7 day course started 11/28/2020   amphetamine-dextroamphetamine (ADDERALL) 15 MG tablet Take 15 mg by mouth daily as needed (when needs to be alert/awake; for MD appointments). 12/02/2020: #60/30 days filled 10/03/2020 [facility] per PMP AWARE   apixaban (ELIQUIS) 5 MG TABS tablet Take 5 mg by mouth 2 (two) times daily. 12/02/2020: On hold due to procedure scheduled for 12/04/2020   atorvastatin (LIPITOR) 40 MG tablet Take 40 mg by mouth every evening.    cilostazol (PLETAL) 50 MG tablet Take 100 mg by mouth 2 (two) times daily.    cyclobenzaprine (FLEXERIL) 10 MG tablet Take 10 mg by mouth 3 (three) times daily.    EPINEPHrine 0.3 mg/0.3 mL IJ SOAJ injection Inject 0.3 mg into the muscle once as needed for anaphylaxis.    escitalopram (LEXAPRO) 20 MG tablet Take 20 mg by mouth every morning.    gabapentin (NEURONTIN) 300 MG capsule Take 300 mg by mouth 3 (three) times daily.    HYDROmorphone (DILAUDID) 4 MG tablet Take 4 mg by mouth 4 (four) times daily as needed (pain). 12/02/2020: #105/26 filled 11/13/2020 [facility] per PMP AWARE   losartan (COZAAR) 50 MG tablet Take 50 mg by mouth every morning.    Melatonin 5 MG CHEW Chew 10  mg by mouth at bedtime.    metFORMIN (GLUCOPHAGE) 500 MG tablet Take 500 mg by mouth 2 (two) times daily with a meal.    nitroGLYCERIN (NITROSTAT) 0.4 MG SL tablet Place 1 tablet (0.4 mg total) under the tongue every 5 (five) minutes as needed for chest pain. 12/02/2020: Pt took 2 tablets one day last week   pantoprazole (PROTONIX) 40 MG tablet Take 40 mg by mouth every morning.     potassium chloride (KLOR-CON) 10 MEQ tablet Take 10 mEq by mouth every morning.    sitaGLIPtin (JANUVIA) 100 MG tablet Take 100 mg by mouth every morning.    No facility-administered encounter medications on file as of 09/22/2021.    Patient Active Problem List   Diagnosis Date Noted   Stenosis of right internal carotid artery    CVA (cerebral vascular accident) ([place]) 12/02/2020   Hyperkalemia 12/02/2020   PAD (peripheral artery disease) ([place]) 05/08/2019   COPD ? GOLD stage/ active smoker 12/20/2018   Angina pectoris ([place]) 06/27/2018   Lumbar disc disease with radiculopathy 10/05/2017   Coronary artery disease involving native coronary artery of native heart with angina pectoris ([place]) 02/23/2017   Essential hypertension 02/23/2017   History of pulmonary embolism 02/23/2017   Neoplasm of larynx 09/29/2016   Chronic laryngitis 09/29/2016   RLS (restless legs syndrome) 07/28/2016   Arthritis 02/12/2016   Respiratory bronchiolitis interstitial lung disease ([place]) 02/12/2016   Moderate COPD (chronic obstructive pulmonary disease) ([place]) 02/12/2016   Acute respiratory failure ([place]) 01/27/2016   Anemia 01/27/2016   Hyponatremia 01/27/2016   Sepsis ([place]) 01/27/2016   Asthma 01/27/2016   S/P total knee replacement 01/20/2016   Atheroscler of native artery of both legs with intermit claudication ([place]) 12/16/2015   Bilateral carotid artery stenosis 12/16/2015   Post-traumatic osteoarthritis of right knee 07/25/2015   Cigarette smoker 05/07/2015   Diabetes mellitus due to underlying condition with unspecified complications ([place]) 67/67/2094   Dyslipidemia 05/07/2015   History of carotid endarterectomy 05/07/2015    Conditions to be addressed/monitored: COPD, DM II, CVA, and HTN.  Film/video editor, Limited Social Support, Level of Care Concerns, ADL/IADL Limitations, Limited Access to Caregiver, and Lacks Knowledge of [employer].  Care Plan : LCSW Plan of Care  Updates made by  Barbara Gaines, LCSW since 09/22/2021 12:00 AM     Problem: Maintain My Quality of Life through [place].   Priority: High     Goal: Maintain My Quality of Life through [place].   Start Date: 06/23/2021  Expected End Date: 10/20/2021  This Visit's Progress: On track  Recent Progress: On track  Priority: High  Note:   Current Barriers:   Patient with Chronic Obstructive Pulmonary Disease, Acute Respiratory Failure, Hypertension, Coronary Artery Disease and Cerebral Vascular Accident needs Support, Education, Referrals, Resources, Advocacy and Care Coordination to resolve unmet personal care needs in the home. Patient is unable to self-administer medications as prescribed or consistently perform ADL's/IADL's independently. Clinical Goals:  Patient will have in-home care services in place, through a private agency of choice, from the list provided.    Patient will demonstrate improved health management independence, as evidenced by having in-home care services in place. Clinical Interventions: Collaboration with Primary Care Physician, Dr. Nicholos French regarding development and update of comprehensive plan of care as evidenced by provider attestation and co-signature. Inter-disciplinary care team collaboration (see longitudinal plan of care). Problem Solving Solutions identified. In-Home Care Services encouraged.   Caregiver Stress acknowledged.  Emotional Support provided. Verbalization of Feelings emphasized. Patient Goals/Self-Care Activities:  Continue to work with LCSW on a monthly basis, in an effort to obtain in-home care services, through a private agency of choice, from the list provided.   LCSW collaboration with Truman Hayward, Adult Medicaid Case Worker with the [place] [phone]), to confirm ineligibility for full Adult Medicaid coverage.   LCSW collaboration with Truman Hayward, Adult Medicaid Case Worker with the [place] [phone]), to confirm eligibility for MQB [facility] for Qualified Beneficiaries). ~ MQB is a special Medicaid program for people on Medicare who have limited income and assets but are not eligible for full Adult Medicaid coverage. ~ MQB is one of the four Medicare Savings Programs that allows you to get help from your state to pay your Medicare premiums.  ~ MQB helps pay for Part A premiums, Part B premiums, and deductibles, coinsurance, and copayment's. Please disregard information and application for PCS (Personal Care Services), through [employer], due to ineligibility for full Adult Medicaid coverage. Review list of [place], and [place] Providers, and consider self-enrollment in a private agency of choice. Contact LCSW directly (# [phone]) if you have questions, need assistance, or if additional social work needs are identified between now and our next scheduled telephone outreach call.  Follow-Up:  10/20/2021 at 9:00 am     Nat Christen, BSW, MSW, [place]  Licensed Clinical Social Worker  [place]  Mailing [place] [address], [place], [place] [address] Physical Address-[address] [place], [place] [address] Toll Free Main # [phone] Fax # [phone] Cell # [phone] [email or web address]

## 2021-09-25 DIAGNOSIS — E1140 Type 2 diabetes mellitus with diabetic neuropathy, unspecified: Secondary | ICD-10-CM | POA: Diagnosis not present

## 2021-09-25 DIAGNOSIS — G4711 Idiopathic hypersomnia with long sleep time: Secondary | ICD-10-CM | POA: Diagnosis not present

## 2021-09-25 DIAGNOSIS — E1151 Type 2 diabetes mellitus with diabetic peripheral angiopathy without gangrene: Secondary | ICD-10-CM | POA: Diagnosis not present

## 2021-09-25 DIAGNOSIS — Z8616 Personal history of COVID-19: Secondary | ICD-10-CM | POA: Diagnosis not present

## 2021-09-25 DIAGNOSIS — I2510 Atherosclerotic heart disease of native coronary artery without angina pectoris: Secondary | ICD-10-CM | POA: Diagnosis not present

## 2021-09-25 DIAGNOSIS — F17210 Nicotine dependence, cigarettes, uncomplicated: Secondary | ICD-10-CM | POA: Diagnosis not present

## 2021-09-25 DIAGNOSIS — I10 Essential (primary) hypertension: Secondary | ICD-10-CM | POA: Diagnosis not present

## 2021-09-25 DIAGNOSIS — J449 Chronic obstructive pulmonary disease, unspecified: Secondary | ICD-10-CM | POA: Diagnosis not present

## 2021-09-25 DIAGNOSIS — Z7984 Long term (current) use of oral hypoglycemic drugs: Secondary | ICD-10-CM | POA: Diagnosis not present

## 2021-09-25 DIAGNOSIS — Z9181 History of falling: Secondary | ICD-10-CM | POA: Diagnosis not present

## 2021-09-25 DIAGNOSIS — Z8673 Personal history of transient ischemic attack (TIA), and cerebral infarction without residual deficits: Secondary | ICD-10-CM | POA: Diagnosis not present

## 2021-09-25 DIAGNOSIS — Z7901 Long term (current) use of anticoagulants: Secondary | ICD-10-CM | POA: Diagnosis not present

## 2021-09-25 DIAGNOSIS — Z86711 Personal history of pulmonary embolism: Secondary | ICD-10-CM | POA: Diagnosis not present

## 2021-09-25 DIAGNOSIS — E559 Vitamin D deficiency, unspecified: Secondary | ICD-10-CM | POA: Diagnosis not present

## 2021-09-25 DIAGNOSIS — I214 Non-ST elevation (NSTEMI) myocardial infarction: Secondary | ICD-10-CM | POA: Diagnosis not present

## 2021-09-25 DIAGNOSIS — G4733 Obstructive sleep apnea (adult) (pediatric): Secondary | ICD-10-CM | POA: Diagnosis not present

## 2021-09-25 DIAGNOSIS — K219 Gastro-esophageal reflux disease without esophagitis: Secondary | ICD-10-CM | POA: Diagnosis not present

## 2021-09-25 DIAGNOSIS — Z86718 Personal history of other venous thrombosis and embolism: Secondary | ICD-10-CM | POA: Diagnosis not present

## 2021-09-25 DIAGNOSIS — I69315 Cognitive social or emotional deficit following cerebral infarction: Secondary | ICD-10-CM | POA: Diagnosis not present

## 2021-09-26 DIAGNOSIS — G4733 Obstructive sleep apnea (adult) (pediatric): Secondary | ICD-10-CM | POA: Diagnosis not present

## 2021-09-26 DIAGNOSIS — I10 Essential (primary) hypertension: Secondary | ICD-10-CM | POA: Diagnosis not present

## 2021-09-26 DIAGNOSIS — K219 Gastro-esophageal reflux disease without esophagitis: Secondary | ICD-10-CM | POA: Diagnosis not present

## 2021-09-26 DIAGNOSIS — I69315 Cognitive social or emotional deficit following cerebral infarction: Secondary | ICD-10-CM | POA: Diagnosis not present

## 2021-09-26 DIAGNOSIS — Z7984 Long term (current) use of oral hypoglycemic drugs: Secondary | ICD-10-CM | POA: Diagnosis not present

## 2021-09-26 DIAGNOSIS — J449 Chronic obstructive pulmonary disease, unspecified: Secondary | ICD-10-CM | POA: Diagnosis not present

## 2021-09-26 DIAGNOSIS — Z8673 Personal history of transient ischemic attack (TIA), and cerebral infarction without residual deficits: Secondary | ICD-10-CM | POA: Diagnosis not present

## 2021-09-26 DIAGNOSIS — I214 Non-ST elevation (NSTEMI) myocardial infarction: Secondary | ICD-10-CM | POA: Diagnosis not present

## 2021-09-26 DIAGNOSIS — E559 Vitamin D deficiency, unspecified: Secondary | ICD-10-CM | POA: Diagnosis not present

## 2021-09-26 DIAGNOSIS — Z8616 Personal history of COVID-19: Secondary | ICD-10-CM | POA: Diagnosis not present

## 2021-09-26 DIAGNOSIS — I2510 Atherosclerotic heart disease of native coronary artery without angina pectoris: Secondary | ICD-10-CM | POA: Diagnosis not present

## 2021-09-26 DIAGNOSIS — E1151 Type 2 diabetes mellitus with diabetic peripheral angiopathy without gangrene: Secondary | ICD-10-CM | POA: Diagnosis not present

## 2021-09-26 DIAGNOSIS — Z86718 Personal history of other venous thrombosis and embolism: Secondary | ICD-10-CM | POA: Diagnosis not present

## 2021-09-26 DIAGNOSIS — Z86711 Personal history of pulmonary embolism: Secondary | ICD-10-CM | POA: Diagnosis not present

## 2021-09-26 DIAGNOSIS — Z7901 Long term (current) use of anticoagulants: Secondary | ICD-10-CM | POA: Diagnosis not present

## 2021-09-26 DIAGNOSIS — Z9181 History of falling: Secondary | ICD-10-CM | POA: Diagnosis not present

## 2021-09-26 DIAGNOSIS — F17210 Nicotine dependence, cigarettes, uncomplicated: Secondary | ICD-10-CM | POA: Diagnosis not present

## 2021-09-26 DIAGNOSIS — E1140 Type 2 diabetes mellitus with diabetic neuropathy, unspecified: Secondary | ICD-10-CM | POA: Diagnosis not present

## 2021-09-26 DIAGNOSIS — G4711 Idiopathic hypersomnia with long sleep time: Secondary | ICD-10-CM | POA: Diagnosis not present

## 2021-09-30 DIAGNOSIS — Z86718 Personal history of other venous thrombosis and embolism: Secondary | ICD-10-CM | POA: Diagnosis not present

## 2021-09-30 DIAGNOSIS — R590 Localized enlarged lymph nodes: Secondary | ICD-10-CM | POA: Diagnosis not present

## 2021-09-30 DIAGNOSIS — E119 Type 2 diabetes mellitus without complications: Secondary | ICD-10-CM | POA: Diagnosis not present

## 2021-09-30 DIAGNOSIS — I10 Essential (primary) hypertension: Secondary | ICD-10-CM | POA: Diagnosis not present

## 2021-09-30 DIAGNOSIS — Y939 Activity, unspecified: Secondary | ICD-10-CM | POA: Diagnosis not present

## 2021-09-30 DIAGNOSIS — R0902 Hypoxemia: Secondary | ICD-10-CM | POA: Diagnosis not present

## 2021-09-30 DIAGNOSIS — Z86711 Personal history of pulmonary embolism: Secondary | ICD-10-CM | POA: Diagnosis not present

## 2021-09-30 DIAGNOSIS — Z9861 Coronary angioplasty status: Secondary | ICD-10-CM | POA: Diagnosis not present

## 2021-09-30 DIAGNOSIS — R58 Hemorrhage, not elsewhere classified: Secondary | ICD-10-CM | POA: Diagnosis not present

## 2021-09-30 DIAGNOSIS — Z743 Need for continuous supervision: Secondary | ICD-10-CM | POA: Diagnosis not present

## 2021-09-30 DIAGNOSIS — F32A Depression, unspecified: Secondary | ICD-10-CM | POA: Diagnosis not present

## 2021-09-30 DIAGNOSIS — G4733 Obstructive sleep apnea (adult) (pediatric): Secondary | ICD-10-CM | POA: Diagnosis not present

## 2021-09-30 DIAGNOSIS — E1140 Type 2 diabetes mellitus with diabetic neuropathy, unspecified: Secondary | ICD-10-CM | POA: Diagnosis not present

## 2021-09-30 DIAGNOSIS — G4711 Idiopathic hypersomnia with long sleep time: Secondary | ICD-10-CM | POA: Diagnosis not present

## 2021-09-30 DIAGNOSIS — I2510 Atherosclerotic heart disease of native coronary artery without angina pectoris: Secondary | ICD-10-CM | POA: Diagnosis not present

## 2021-09-30 DIAGNOSIS — J9601 Acute respiratory failure with hypoxia: Secondary | ICD-10-CM | POA: Diagnosis not present

## 2021-09-30 DIAGNOSIS — Z720 Tobacco use: Secondary | ICD-10-CM | POA: Diagnosis not present

## 2021-09-30 DIAGNOSIS — R911 Solitary pulmonary nodule: Secondary | ICD-10-CM | POA: Diagnosis not present

## 2021-09-30 DIAGNOSIS — E871 Hypo-osmolality and hyponatremia: Secondary | ICD-10-CM | POA: Diagnosis not present

## 2021-09-30 DIAGNOSIS — Z7984 Long term (current) use of oral hypoglycemic drugs: Secondary | ICD-10-CM | POA: Diagnosis not present

## 2021-09-30 DIAGNOSIS — S3991XA Unspecified injury of abdomen, initial encounter: Secondary | ICD-10-CM | POA: Diagnosis not present

## 2021-09-30 DIAGNOSIS — Y92009 Unspecified place in unspecified non-institutional (private) residence as the place of occurrence of the external cause: Secondary | ICD-10-CM | POA: Diagnosis not present

## 2021-09-30 DIAGNOSIS — W1831XA Fall on same level due to stepping on an object, initial encounter: Secondary | ICD-10-CM | POA: Diagnosis not present

## 2021-09-30 DIAGNOSIS — R600 Localized edema: Secondary | ICD-10-CM | POA: Diagnosis not present

## 2021-09-30 DIAGNOSIS — Z9181 History of falling: Secondary | ICD-10-CM | POA: Diagnosis not present

## 2021-09-30 DIAGNOSIS — R6889 Other general symptoms and signs: Secondary | ICD-10-CM | POA: Diagnosis not present

## 2021-09-30 DIAGNOSIS — J441 Chronic obstructive pulmonary disease with (acute) exacerbation: Secondary | ICD-10-CM | POA: Diagnosis not present

## 2021-09-30 DIAGNOSIS — E785 Hyperlipidemia, unspecified: Secondary | ICD-10-CM | POA: Diagnosis not present

## 2021-09-30 DIAGNOSIS — W19XXXA Unspecified fall, initial encounter: Secondary | ICD-10-CM | POA: Diagnosis not present

## 2021-09-30 DIAGNOSIS — Z8616 Personal history of COVID-19: Secondary | ICD-10-CM | POA: Diagnosis not present

## 2021-09-30 DIAGNOSIS — I69315 Cognitive social or emotional deficit following cerebral infarction: Secondary | ICD-10-CM | POA: Diagnosis not present

## 2021-09-30 DIAGNOSIS — R079 Chest pain, unspecified: Secondary | ICD-10-CM | POA: Diagnosis not present

## 2021-09-30 DIAGNOSIS — E559 Vitamin D deficiency, unspecified: Secondary | ICD-10-CM | POA: Diagnosis not present

## 2021-09-30 DIAGNOSIS — G4710 Hypersomnia, unspecified: Secondary | ICD-10-CM | POA: Diagnosis not present

## 2021-09-30 DIAGNOSIS — J9621 Acute and chronic respiratory failure with hypoxia: Secondary | ICD-10-CM | POA: Diagnosis not present

## 2021-09-30 DIAGNOSIS — K219 Gastro-esophageal reflux disease without esophagitis: Secondary | ICD-10-CM | POA: Diagnosis not present

## 2021-09-30 DIAGNOSIS — Z8673 Personal history of transient ischemic attack (TIA), and cerebral infarction without residual deficits: Secondary | ICD-10-CM | POA: Diagnosis not present

## 2021-09-30 DIAGNOSIS — E1151 Type 2 diabetes mellitus with diabetic peripheral angiopathy without gangrene: Secondary | ICD-10-CM | POA: Diagnosis not present

## 2021-09-30 DIAGNOSIS — I214 Non-ST elevation (NSTEMI) myocardial infarction: Secondary | ICD-10-CM | POA: Diagnosis not present

## 2021-09-30 DIAGNOSIS — I517 Cardiomegaly: Secondary | ICD-10-CM | POA: Diagnosis not present

## 2021-09-30 DIAGNOSIS — Z9981 Dependence on supplemental oxygen: Secondary | ICD-10-CM | POA: Diagnosis not present

## 2021-09-30 DIAGNOSIS — D689 Coagulation defect, unspecified: Secondary | ICD-10-CM | POA: Diagnosis not present

## 2021-09-30 DIAGNOSIS — R9389 Abnormal findings on diagnostic imaging of other specified body structures: Secondary | ICD-10-CM | POA: Diagnosis not present

## 2021-09-30 DIAGNOSIS — Z981 Arthrodesis status: Secondary | ICD-10-CM | POA: Diagnosis not present

## 2021-09-30 DIAGNOSIS — Z7901 Long term (current) use of anticoagulants: Secondary | ICD-10-CM | POA: Diagnosis not present

## 2021-09-30 DIAGNOSIS — J811 Chronic pulmonary edema: Secondary | ICD-10-CM | POA: Diagnosis not present

## 2021-09-30 DIAGNOSIS — M7989 Other specified soft tissue disorders: Secondary | ICD-10-CM | POA: Diagnosis not present

## 2021-09-30 DIAGNOSIS — Z20822 Contact with and (suspected) exposure to covid-19: Secondary | ICD-10-CM | POA: Diagnosis not present

## 2021-09-30 DIAGNOSIS — J449 Chronic obstructive pulmonary disease, unspecified: Secondary | ICD-10-CM | POA: Diagnosis not present

## 2021-09-30 DIAGNOSIS — R0602 Shortness of breath: Secondary | ICD-10-CM | POA: Diagnosis not present

## 2021-09-30 DIAGNOSIS — R296 Repeated falls: Secondary | ICD-10-CM | POA: Diagnosis not present

## 2021-09-30 DIAGNOSIS — R918 Other nonspecific abnormal finding of lung field: Secondary | ICD-10-CM | POA: Diagnosis not present

## 2021-09-30 DIAGNOSIS — M25571 Pain in right ankle and joints of right foot: Secondary | ICD-10-CM | POA: Diagnosis not present

## 2021-09-30 DIAGNOSIS — Z8669 Personal history of other diseases of the nervous system and sense organs: Secondary | ICD-10-CM | POA: Diagnosis not present

## 2021-09-30 DIAGNOSIS — W1839XA Other fall on same level, initial encounter: Secondary | ICD-10-CM | POA: Diagnosis not present

## 2021-09-30 DIAGNOSIS — F17210 Nicotine dependence, cigarettes, uncomplicated: Secondary | ICD-10-CM | POA: Diagnosis not present

## 2021-09-30 DIAGNOSIS — Z043 Encounter for examination and observation following other accident: Secondary | ICD-10-CM | POA: Diagnosis not present

## 2021-09-30 DIAGNOSIS — L03116 Cellulitis of left lower limb: Secondary | ICD-10-CM | POA: Diagnosis not present

## 2021-09-30 DIAGNOSIS — E872 Acidosis, unspecified: Secondary | ICD-10-CM | POA: Diagnosis not present

## 2021-10-01 DIAGNOSIS — R0602 Shortness of breath: Secondary | ICD-10-CM | POA: Diagnosis not present

## 2021-10-01 DIAGNOSIS — Z9181 History of falling: Secondary | ICD-10-CM | POA: Diagnosis not present

## 2021-10-01 DIAGNOSIS — J449 Chronic obstructive pulmonary disease, unspecified: Secondary | ICD-10-CM | POA: Diagnosis not present

## 2021-10-01 DIAGNOSIS — R590 Localized enlarged lymph nodes: Secondary | ICD-10-CM | POA: Diagnosis not present

## 2021-10-01 DIAGNOSIS — G4710 Hypersomnia, unspecified: Secondary | ICD-10-CM | POA: Diagnosis not present

## 2021-10-01 DIAGNOSIS — R911 Solitary pulmonary nodule: Secondary | ICD-10-CM | POA: Diagnosis not present

## 2021-10-01 DIAGNOSIS — F17210 Nicotine dependence, cigarettes, uncomplicated: Secondary | ICD-10-CM | POA: Diagnosis not present

## 2021-10-01 DIAGNOSIS — Z8669 Personal history of other diseases of the nervous system and sense organs: Secondary | ICD-10-CM | POA: Diagnosis not present

## 2021-10-01 DIAGNOSIS — J9601 Acute respiratory failure with hypoxia: Secondary | ICD-10-CM | POA: Diagnosis not present

## 2021-10-01 DIAGNOSIS — E872 Acidosis, unspecified: Secondary | ICD-10-CM | POA: Diagnosis not present

## 2021-10-01 DIAGNOSIS — E871 Hypo-osmolality and hyponatremia: Secondary | ICD-10-CM | POA: Diagnosis not present

## 2021-10-02 DIAGNOSIS — G4710 Hypersomnia, unspecified: Secondary | ICD-10-CM | POA: Diagnosis not present

## 2021-10-02 DIAGNOSIS — F17210 Nicotine dependence, cigarettes, uncomplicated: Secondary | ICD-10-CM | POA: Diagnosis not present

## 2021-10-02 DIAGNOSIS — E872 Acidosis, unspecified: Secondary | ICD-10-CM | POA: Diagnosis not present

## 2021-10-02 DIAGNOSIS — R911 Solitary pulmonary nodule: Secondary | ICD-10-CM | POA: Diagnosis not present

## 2021-10-02 DIAGNOSIS — J449 Chronic obstructive pulmonary disease, unspecified: Secondary | ICD-10-CM | POA: Diagnosis not present

## 2021-10-02 DIAGNOSIS — E871 Hypo-osmolality and hyponatremia: Secondary | ICD-10-CM | POA: Diagnosis not present

## 2021-10-02 DIAGNOSIS — Z9181 History of falling: Secondary | ICD-10-CM | POA: Diagnosis not present

## 2021-10-02 DIAGNOSIS — R590 Localized enlarged lymph nodes: Secondary | ICD-10-CM | POA: Diagnosis not present

## 2021-10-02 DIAGNOSIS — J9601 Acute respiratory failure with hypoxia: Secondary | ICD-10-CM | POA: Diagnosis not present

## 2021-10-02 DIAGNOSIS — Z720 Tobacco use: Secondary | ICD-10-CM | POA: Diagnosis not present

## 2021-10-03 DIAGNOSIS — Y92009 Unspecified place in unspecified non-institutional (private) residence as the place of occurrence of the external cause: Secondary | ICD-10-CM | POA: Diagnosis not present

## 2021-10-03 DIAGNOSIS — I10 Essential (primary) hypertension: Secondary | ICD-10-CM | POA: Diagnosis not present

## 2021-10-03 DIAGNOSIS — M25571 Pain in right ankle and joints of right foot: Secondary | ICD-10-CM | POA: Diagnosis not present

## 2021-10-03 DIAGNOSIS — E871 Hypo-osmolality and hyponatremia: Secondary | ICD-10-CM | POA: Diagnosis not present

## 2021-10-03 DIAGNOSIS — J441 Chronic obstructive pulmonary disease with (acute) exacerbation: Secondary | ICD-10-CM | POA: Diagnosis not present

## 2021-10-03 DIAGNOSIS — J9601 Acute respiratory failure with hypoxia: Secondary | ICD-10-CM | POA: Diagnosis not present

## 2021-10-03 DIAGNOSIS — J9621 Acute and chronic respiratory failure with hypoxia: Secondary | ICD-10-CM | POA: Diagnosis not present

## 2021-10-03 DIAGNOSIS — Z9981 Dependence on supplemental oxygen: Secondary | ICD-10-CM | POA: Diagnosis not present

## 2021-10-03 DIAGNOSIS — Z720 Tobacco use: Secondary | ICD-10-CM | POA: Diagnosis not present

## 2021-10-03 DIAGNOSIS — E119 Type 2 diabetes mellitus without complications: Secondary | ICD-10-CM | POA: Diagnosis not present

## 2021-10-03 DIAGNOSIS — J9611 Chronic respiratory failure with hypoxia: Secondary | ICD-10-CM

## 2021-10-03 DIAGNOSIS — W1831XA Fall on same level due to stepping on an object, initial encounter: Secondary | ICD-10-CM | POA: Diagnosis not present

## 2021-10-03 DIAGNOSIS — R590 Localized enlarged lymph nodes: Secondary | ICD-10-CM | POA: Diagnosis not present

## 2021-10-03 DIAGNOSIS — Z9181 History of falling: Secondary | ICD-10-CM | POA: Diagnosis not present

## 2021-10-03 DIAGNOSIS — R911 Solitary pulmonary nodule: Secondary | ICD-10-CM | POA: Diagnosis not present

## 2021-10-03 HISTORY — DX: Chronic respiratory failure with hypoxia: J96.11

## 2021-10-07 ENCOUNTER — Other Ambulatory Visit: Payer: Self-pay | Admitting: *Deleted

## 2021-10-07 DIAGNOSIS — G894 Chronic pain syndrome: Secondary | ICD-10-CM | POA: Diagnosis not present

## 2021-10-07 DIAGNOSIS — Z79899 Other long term (current) drug therapy: Secondary | ICD-10-CM | POA: Diagnosis not present

## 2021-10-07 DIAGNOSIS — M545 Low back pain, unspecified: Secondary | ICD-10-CM | POA: Diagnosis not present

## 2021-10-07 DIAGNOSIS — Z79891 Long term (current) use of opiate analgesic: Secondary | ICD-10-CM | POA: Diagnosis not present

## 2021-10-07 DIAGNOSIS — M5106 Intervertebral disc disorders with myelopathy, lumbar region: Secondary | ICD-10-CM | POA: Diagnosis not present

## 2021-10-07 NOTE — Patient Outreach (Signed)
[facility]) Care Management  10/07/2021  ODEAN MCELWAIN 06-Nov-1955 [id number]   Outgoing call placed to member, unsuccessful.  Female answering phone state she and daughter had just left to go to MD appointment.  Will follow up within the next 3-4 business days.  [name], RN, MSN, [place] Manager [phone]

## 2021-10-08 DIAGNOSIS — R296 Repeated falls: Secondary | ICD-10-CM

## 2021-10-08 HISTORY — DX: Repeated falls: R29.6

## 2021-10-10 ENCOUNTER — Other Ambulatory Visit: Payer: Self-pay | Admitting: *Deleted

## 2021-10-10 DIAGNOSIS — Z8616 Personal history of COVID-19: Secondary | ICD-10-CM | POA: Diagnosis not present

## 2021-10-10 DIAGNOSIS — K219 Gastro-esophageal reflux disease without esophagitis: Secondary | ICD-10-CM | POA: Diagnosis not present

## 2021-10-10 DIAGNOSIS — Z7902 Long term (current) use of antithrombotics/antiplatelets: Secondary | ICD-10-CM | POA: Diagnosis not present

## 2021-10-10 DIAGNOSIS — Z7984 Long term (current) use of oral hypoglycemic drugs: Secondary | ICD-10-CM | POA: Diagnosis not present

## 2021-10-10 DIAGNOSIS — I2510 Atherosclerotic heart disease of native coronary artery without angina pectoris: Secondary | ICD-10-CM | POA: Diagnosis not present

## 2021-10-10 DIAGNOSIS — J9611 Chronic respiratory failure with hypoxia: Secondary | ICD-10-CM | POA: Diagnosis not present

## 2021-10-10 DIAGNOSIS — E559 Vitamin D deficiency, unspecified: Secondary | ICD-10-CM | POA: Diagnosis not present

## 2021-10-10 DIAGNOSIS — F17210 Nicotine dependence, cigarettes, uncomplicated: Secondary | ICD-10-CM | POA: Diagnosis not present

## 2021-10-10 DIAGNOSIS — I10 Essential (primary) hypertension: Secondary | ICD-10-CM | POA: Diagnosis not present

## 2021-10-10 DIAGNOSIS — J449 Chronic obstructive pulmonary disease, unspecified: Secondary | ICD-10-CM | POA: Diagnosis not present

## 2021-10-10 DIAGNOSIS — E1151 Type 2 diabetes mellitus with diabetic peripheral angiopathy without gangrene: Secondary | ICD-10-CM | POA: Diagnosis not present

## 2021-10-10 DIAGNOSIS — R296 Repeated falls: Secondary | ICD-10-CM | POA: Diagnosis not present

## 2021-10-10 DIAGNOSIS — G4711 Idiopathic hypersomnia with long sleep time: Secondary | ICD-10-CM | POA: Diagnosis not present

## 2021-10-10 DIAGNOSIS — Z86718 Personal history of other venous thrombosis and embolism: Secondary | ICD-10-CM | POA: Diagnosis not present

## 2021-10-10 DIAGNOSIS — I69315 Cognitive social or emotional deficit following cerebral infarction: Secondary | ICD-10-CM | POA: Diagnosis not present

## 2021-10-10 DIAGNOSIS — I214 Non-ST elevation (NSTEMI) myocardial infarction: Secondary | ICD-10-CM | POA: Diagnosis not present

## 2021-10-10 DIAGNOSIS — Z8673 Personal history of transient ischemic attack (TIA), and cerebral infarction without residual deficits: Secondary | ICD-10-CM | POA: Diagnosis not present

## 2021-10-10 DIAGNOSIS — G4733 Obstructive sleep apnea (adult) (pediatric): Secondary | ICD-10-CM | POA: Diagnosis not present

## 2021-10-10 DIAGNOSIS — Z86711 Personal history of pulmonary embolism: Secondary | ICD-10-CM | POA: Diagnosis not present

## 2021-10-10 DIAGNOSIS — Z9181 History of falling: Secondary | ICD-10-CM | POA: Diagnosis not present

## 2021-10-10 DIAGNOSIS — E1140 Type 2 diabetes mellitus with diabetic neuropathy, unspecified: Secondary | ICD-10-CM | POA: Diagnosis not present

## 2021-10-10 NOTE — Patient Outreach (Signed)
[facility]) Care Management ? ?10/10/2021 ? ?Barbara French ?[birth year] ?[id number] ? ? ?Outreach attempt #2, unsuccessful to member, HIPAA compliant voice message left.  Call placed to daughter [name], state she does not have time to talk but will call back once she is off work.  Will await call back, if no call back will send outreach letter and follow up within the next 3-4 business days. ? ?[name], RN, MSN, CCM ?[facility] Care Management  ?Community Care Manager ?[phone] ? ?

## 2021-10-15 ENCOUNTER — Other Ambulatory Visit: Payer: Self-pay | Admitting: *Deleted

## 2021-10-15 DIAGNOSIS — R911 Solitary pulmonary nodule: Secondary | ICD-10-CM | POA: Diagnosis not present

## 2021-10-15 NOTE — Patient Outreach (Signed)
Barbara French [facility]) Care Management ? ?10/15/2021 ? ?Barbara French ?June 15, 1956 ?[id number] ? ? ?Coverage for [name], RN ? ?Telephone Assessment-Unsuccessful ?Outreach #3 ? ?RN attempted outreach call to both the home and cell only able to leave a HIPAA approved voice message requesting a call back. ? ?[facility] will follow up over the next month for ongoing [facility] services. ? ?Raina Mina, RN ?Care Management Coordinator ?[place] ?Main Office [phone]  ?

## 2021-10-16 DIAGNOSIS — E785 Hyperlipidemia, unspecified: Secondary | ICD-10-CM | POA: Diagnosis not present

## 2021-10-16 DIAGNOSIS — Z781 Physical restraint status: Secondary | ICD-10-CM | POA: Diagnosis not present

## 2021-10-16 DIAGNOSIS — Z7984 Long term (current) use of oral hypoglycemic drugs: Secondary | ICD-10-CM | POA: Diagnosis not present

## 2021-10-16 DIAGNOSIS — R569 Unspecified convulsions: Secondary | ICD-10-CM | POA: Diagnosis not present

## 2021-10-16 DIAGNOSIS — W208XXA Other cause of strike by thrown, projected or falling object, initial encounter: Secondary | ICD-10-CM | POA: Diagnosis not present

## 2021-10-16 DIAGNOSIS — Y939 Activity, unspecified: Secondary | ICD-10-CM | POA: Diagnosis not present

## 2021-10-16 DIAGNOSIS — Y92003 Bedroom of unspecified non-institutional (private) residence as the place of occurrence of the external cause: Secondary | ICD-10-CM | POA: Diagnosis not present

## 2021-10-16 DIAGNOSIS — Z8673 Personal history of transient ischemic attack (TIA), and cerebral infarction without residual deficits: Secondary | ICD-10-CM | POA: Diagnosis not present

## 2021-10-16 DIAGNOSIS — R410 Disorientation, unspecified: Secondary | ICD-10-CM | POA: Diagnosis not present

## 2021-10-16 DIAGNOSIS — R911 Solitary pulmonary nodule: Secondary | ICD-10-CM | POA: Diagnosis not present

## 2021-10-16 DIAGNOSIS — Z720 Tobacco use: Secondary | ICD-10-CM | POA: Diagnosis not present

## 2021-10-16 DIAGNOSIS — Y999 Unspecified external cause status: Secondary | ICD-10-CM | POA: Diagnosis not present

## 2021-10-16 DIAGNOSIS — Z79899 Other long term (current) drug therapy: Secondary | ICD-10-CM | POA: Diagnosis not present

## 2021-10-16 DIAGNOSIS — R300 Dysuria: Secondary | ICD-10-CM | POA: Diagnosis not present

## 2021-10-16 DIAGNOSIS — Z8249 Family history of ischemic heart disease and other diseases of the circulatory system: Secondary | ICD-10-CM | POA: Diagnosis not present

## 2021-10-16 DIAGNOSIS — T426X6A Underdosing of other antiepileptic and sedative-hypnotic drugs, initial encounter: Secondary | ICD-10-CM | POA: Diagnosis not present

## 2021-10-16 DIAGNOSIS — Z743 Need for continuous supervision: Secondary | ICD-10-CM | POA: Diagnosis not present

## 2021-10-16 DIAGNOSIS — S40012A Contusion of left shoulder, initial encounter: Secondary | ICD-10-CM | POA: Diagnosis not present

## 2021-10-16 DIAGNOSIS — G929 Unspecified toxic encephalopathy: Secondary | ICD-10-CM | POA: Diagnosis not present

## 2021-10-16 DIAGNOSIS — R531 Weakness: Secondary | ICD-10-CM | POA: Diagnosis not present

## 2021-10-16 DIAGNOSIS — E119 Type 2 diabetes mellitus without complications: Secondary | ICD-10-CM | POA: Diagnosis not present

## 2021-10-16 DIAGNOSIS — M25512 Pain in left shoulder: Secondary | ICD-10-CM | POA: Diagnosis not present

## 2021-10-16 DIAGNOSIS — I6789 Other cerebrovascular disease: Secondary | ICD-10-CM | POA: Diagnosis not present

## 2021-10-16 DIAGNOSIS — E7220 Disorder of urea cycle metabolism, unspecified: Secondary | ICD-10-CM | POA: Diagnosis not present

## 2021-10-16 DIAGNOSIS — R079 Chest pain, unspecified: Secondary | ICD-10-CM | POA: Diagnosis not present

## 2021-10-16 DIAGNOSIS — F32A Depression, unspecified: Secondary | ICD-10-CM | POA: Diagnosis not present

## 2021-10-16 DIAGNOSIS — Z86711 Personal history of pulmonary embolism: Secondary | ICD-10-CM | POA: Diagnosis not present

## 2021-10-16 DIAGNOSIS — M542 Cervicalgia: Secondary | ICD-10-CM | POA: Diagnosis not present

## 2021-10-16 DIAGNOSIS — E871 Hypo-osmolality and hyponatremia: Secondary | ICD-10-CM | POA: Diagnosis not present

## 2021-10-16 DIAGNOSIS — Y929 Unspecified place or not applicable: Secondary | ICD-10-CM | POA: Diagnosis not present

## 2021-10-16 DIAGNOSIS — R6889 Other general symptoms and signs: Secondary | ICD-10-CM | POA: Diagnosis not present

## 2021-10-16 DIAGNOSIS — R404 Transient alteration of awareness: Secondary | ICD-10-CM | POA: Diagnosis not present

## 2021-10-16 DIAGNOSIS — I10 Essential (primary) hypertension: Secondary | ICD-10-CM | POA: Diagnosis not present

## 2021-10-16 DIAGNOSIS — F17210 Nicotine dependence, cigarettes, uncomplicated: Secondary | ICD-10-CM | POA: Diagnosis not present

## 2021-10-16 DIAGNOSIS — M545 Low back pain, unspecified: Secondary | ICD-10-CM | POA: Diagnosis not present

## 2021-10-16 DIAGNOSIS — I639 Cerebral infarction, unspecified: Secondary | ICD-10-CM | POA: Diagnosis not present

## 2021-10-16 DIAGNOSIS — I110 Hypertensive heart disease with heart failure: Secondary | ICD-10-CM | POA: Diagnosis not present

## 2021-10-16 DIAGNOSIS — I739 Peripheral vascular disease, unspecified: Secondary | ICD-10-CM | POA: Diagnosis not present

## 2021-10-16 DIAGNOSIS — W010XXA Fall on same level from slipping, tripping and stumbling without subsequent striking against object, initial encounter: Secondary | ICD-10-CM | POA: Diagnosis not present

## 2021-10-16 DIAGNOSIS — W19XXXA Unspecified fall, initial encounter: Secondary | ICD-10-CM | POA: Diagnosis not present

## 2021-10-16 DIAGNOSIS — G629 Polyneuropathy, unspecified: Secondary | ICD-10-CM | POA: Diagnosis not present

## 2021-10-16 DIAGNOSIS — F17200 Nicotine dependence, unspecified, uncomplicated: Secondary | ICD-10-CM | POA: Diagnosis not present

## 2021-10-16 DIAGNOSIS — I2510 Atherosclerotic heart disease of native coronary artery without angina pectoris: Secondary | ICD-10-CM | POA: Diagnosis not present

## 2021-10-16 DIAGNOSIS — Z86718 Personal history of other venous thrombosis and embolism: Secondary | ICD-10-CM | POA: Diagnosis not present

## 2021-10-16 DIAGNOSIS — I5020 Unspecified systolic (congestive) heart failure: Secondary | ICD-10-CM | POA: Diagnosis not present

## 2021-10-16 DIAGNOSIS — R778 Other specified abnormalities of plasma proteins: Secondary | ICD-10-CM | POA: Diagnosis not present

## 2021-10-16 DIAGNOSIS — Z7982 Long term (current) use of aspirin: Secondary | ICD-10-CM | POA: Diagnosis not present

## 2021-10-16 DIAGNOSIS — R1084 Generalized abdominal pain: Secondary | ICD-10-CM | POA: Diagnosis not present

## 2021-10-16 DIAGNOSIS — K219 Gastro-esophageal reflux disease without esophagitis: Secondary | ICD-10-CM | POA: Diagnosis not present

## 2021-10-16 DIAGNOSIS — I5021 Acute systolic (congestive) heart failure: Secondary | ICD-10-CM | POA: Diagnosis not present

## 2021-10-16 DIAGNOSIS — Z955 Presence of coronary angioplasty implant and graft: Secondary | ICD-10-CM | POA: Diagnosis not present

## 2021-10-16 DIAGNOSIS — S0990XA Unspecified injury of head, initial encounter: Secondary | ICD-10-CM | POA: Diagnosis not present

## 2021-10-16 DIAGNOSIS — E1151 Type 2 diabetes mellitus with diabetic peripheral angiopathy without gangrene: Secondary | ICD-10-CM | POA: Diagnosis not present

## 2021-10-16 DIAGNOSIS — Y92009 Unspecified place in unspecified non-institutional (private) residence as the place of occurrence of the external cause: Secondary | ICD-10-CM | POA: Diagnosis not present

## 2021-10-16 DIAGNOSIS — J449 Chronic obstructive pulmonary disease, unspecified: Secondary | ICD-10-CM | POA: Diagnosis not present

## 2021-10-16 DIAGNOSIS — I499 Cardiac arrhythmia, unspecified: Secondary | ICD-10-CM | POA: Diagnosis not present

## 2021-10-16 DIAGNOSIS — M79622 Pain in left upper arm: Secondary | ICD-10-CM | POA: Diagnosis not present

## 2021-10-16 DIAGNOSIS — E039 Hypothyroidism, unspecified: Secondary | ICD-10-CM | POA: Diagnosis not present

## 2021-10-16 DIAGNOSIS — Z8669 Personal history of other diseases of the nervous system and sense organs: Secondary | ICD-10-CM | POA: Diagnosis not present

## 2021-10-16 DIAGNOSIS — D72829 Elevated white blood cell count, unspecified: Secondary | ICD-10-CM | POA: Diagnosis not present

## 2021-10-16 DIAGNOSIS — R9431 Abnormal electrocardiogram [ECG] [EKG]: Secondary | ICD-10-CM | POA: Diagnosis not present

## 2021-10-16 DIAGNOSIS — J9602 Acute respiratory failure with hypercapnia: Secondary | ICD-10-CM | POA: Diagnosis not present

## 2021-10-16 DIAGNOSIS — I252 Old myocardial infarction: Secondary | ICD-10-CM | POA: Diagnosis not present

## 2021-10-16 DIAGNOSIS — R451 Restlessness and agitation: Secondary | ICD-10-CM | POA: Diagnosis not present

## 2021-10-16 DIAGNOSIS — I213 ST elevation (STEMI) myocardial infarction of unspecified site: Secondary | ICD-10-CM | POA: Diagnosis not present

## 2021-10-20 ENCOUNTER — Encounter: Payer: Self-pay | Admitting: *Deleted

## 2021-10-20 ENCOUNTER — Other Ambulatory Visit: Payer: Self-pay | Admitting: *Deleted

## 2021-10-20 NOTE — Patient Outreach (Cosign Needed)
Care Management Clinical Social Work Note  10/20/2021 Name: Barbara French MRN: [id number] DOB: 1956/07/24  Barbara French is a 66 y.o. year old female who is a primary care patient of Nicholos Johns, MD.  The Care Management team was consulted for assistance with chronic disease management and coordination needs.  Engaged with patient by telephone for follow up visit in response to provider referral for social work chronic care management and care coordination services  Consent to Services:  Barbara French was given information about Care Management services today including:  Care Management services includes personalized support from designated clinical staff supervised by her physician, including individualized plan of care and coordination with other care providers 24/7 contact phone numbers for assistance for urgent and routine care needs. The patient may stop case management services at any time by phone call to the office staff.  Patient agreed to services and consent obtained.   Assessment: Review of patient past medical history, allergies, medications, and health status, including review of relevant consultants reports was performed today as part of a comprehensive evaluation and provision of chronic care management and care coordination services.  SDOH (Social Determinants of Health) assessments and interventions performed:    Advanced Directives Status: Not addressed in this encounter.  Care Plan  Allergies  Allergen Reactions   Bee Venom Anaphylaxis    Yellow jackets-Throat closed up    Cortisone Other (See Comments), Hives and Swelling    Injection caused temporary paralysis  Shots   Peanut Oil Other (See Comments)    Allergy test showed positive never had a reaction. Keeps epipen with her Identified by allergy test, never had a reaction    Outpatient Encounter Medications as of 10/20/2021  Medication Sig Note   acetaminophen (TYLENOL) 500 MG tablet Take 1,000 mg by mouth 4  (four) times daily as needed for moderate pain or headache.    albuterol (VENTOLIN HFA) 108 (90 Base) MCG/ACT inhaler Inhale 1-2 puffs into the lungs 4 (four) times daily as needed for wheezing or shortness of breath.    amLODipine (NORVASC) 5 MG tablet Take 1 tablet by mouth every morning.    amoxicillin-clavulanate (AUGMENTIN) 875-125 MG tablet Take 1 tablet by mouth 2 (two) times daily. 12/02/2020: 7 day course started 11/28/2020   amphetamine-dextroamphetamine (ADDERALL) 15 MG tablet Take 15 mg by mouth daily as needed (when needs to be alert/awake; for MD appointments). 12/02/2020: #60/30 days filled 10/03/2020 [facility] per PMP AWARE   apixaban (ELIQUIS) 5 MG TABS tablet Take 5 mg by mouth 2 (two) times daily. 12/02/2020: On hold due to procedure scheduled for 12/04/2020   atorvastatin (LIPITOR) 40 MG tablet Take 40 mg by mouth every evening.    cilostazol (PLETAL) 50 MG tablet Take 100 mg by mouth 2 (two) times daily.    cyclobenzaprine (FLEXERIL) 10 MG tablet Take 10 mg by mouth 3 (three) times daily.    EPINEPHrine 0.3 mg/0.3 mL IJ SOAJ injection Inject 0.3 mg into the muscle once as needed for anaphylaxis.    escitalopram (LEXAPRO) 20 MG tablet Take 20 mg by mouth every morning.    gabapentin (NEURONTIN) 300 MG capsule Take 300 mg by mouth 3 (three) times daily.    HYDROmorphone (DILAUDID) 4 MG tablet Take 4 mg by mouth 4 (four) times daily as needed (pain). 12/02/2020: #105/26 filled 11/13/2020 [facility] per PMP AWARE   losartan (COZAAR) 50 MG tablet Take 50 mg by mouth every morning.    Melatonin 5 MG CHEW Chew 10  mg by mouth at bedtime.    metFORMIN (GLUCOPHAGE) 500 MG tablet Take 500 mg by mouth 2 (two) times daily with a meal.    nitroGLYCERIN (NITROSTAT) 0.4 MG SL tablet Place 1 tablet (0.4 mg total) under the tongue every 5 (five) minutes as needed for chest pain. 12/02/2020: Pt took 2 tablets one day last week   pantoprazole (PROTONIX) 40 MG tablet Take 40 mg by mouth every morning.     potassium chloride (KLOR-CON) 10 MEQ tablet Take 10 mEq by mouth every morning.    sitaGLIPtin (JANUVIA) 100 MG tablet Take 100 mg by mouth every morning.    No facility-administered encounter medications on file as of 10/20/2021.    Patient Active Problem List   Diagnosis Date Noted   Stenosis of right internal carotid artery    CVA (cerebral vascular accident) ([place]) 12/02/2020   Hyperkalemia 12/02/2020   PAD (peripheral artery disease) ([place]) 05/08/2019   COPD ? GOLD stage/ active smoker 12/20/2018   Angina pectoris ([place]) 06/27/2018   Lumbar disc disease with radiculopathy 10/05/2017   Coronary artery disease involving native coronary artery of native heart with angina pectoris ([place]) 02/23/2017   Essential hypertension 02/23/2017   History of pulmonary embolism 02/23/2017   Neoplasm of larynx 09/29/2016   Chronic laryngitis 09/29/2016   RLS (restless legs syndrome) 07/28/2016   Arthritis 02/12/2016   Respiratory bronchiolitis interstitial lung disease ([place]) 02/12/2016   Moderate COPD (chronic obstructive pulmonary disease) ([place]) 02/12/2016   Acute respiratory failure ([place]) 01/27/2016   Anemia 01/27/2016   Hyponatremia 01/27/2016   Sepsis ([place]) 01/27/2016   Asthma 01/27/2016   S/P total knee replacement 01/20/2016   Atheroscler of native artery of both legs with intermit claudication ([place]) 12/16/2015   Bilateral carotid artery stenosis 12/16/2015   Post-traumatic osteoarthritis of right knee 07/25/2015   Cigarette smoker 05/07/2015   Diabetes mellitus due to underlying condition with unspecified complications ([place]) 60/45/4098   Dyslipidemia 05/07/2015   History of carotid endarterectomy 05/07/2015    Conditions to be addressed/monitored: HTN, COPD, and DMII.  Limited Social Support, Level of Care Concerns, ADL/IADL Limitations, Social Isolation, Limited Access to Caregiver, and Lacks Knowledge of [employer].  Care Plan : LCSW Plan of Care  Updates made by Francis Gaines, LCSW since 10/20/2021 12:00 AM     Problem: Maintain My Quality of Life through [place]. Resolved 10/20/2021  Priority: High     Goal: Maintain My Quality of Life through [place]. Completed 10/20/2021  Start Date: 06/23/2021  Expected End Date: 10/20/2021  This Visit's Progress: On track  Recent Progress: On track  Priority: High  Note:   Current Barriers:   Patient with Chronic Obstructive Pulmonary Disease, Acute Respiratory Failure, Hypertension, Coronary Artery Disease and Cerebral Vascular Accident needs Support, Education, Referrals, Resources, Advocacy and Care Coordination to resolve unmet personal care needs in the home. Patient is unable to self-administer medications as prescribed or consistently perform ADL's/IADL's independently. Clinical Goals:  Patient will have in-home care services in place, through a private agency of choice, from the list provided.    Patient will demonstrate improved health management independence, as evidenced by having in-home care services in place. Interventions: Collaboration with Primary Care Physician, Dr. Nicholos Johns regarding development and update of comprehensive plan of care as evidenced by provider attestation and co-signature. Inter-disciplinary care team collaboration (see longitudinal plan of care). Clinical Interventions: Problem Solving Solutions identified. In-Home Care Services encouraged.   Caregiver Stress acknowledged.  Emotional Support provided. Verbalization of Feelings emphasized. Patient Goals/Self-Care Activities:  Continue to consider self-enrollment in a private agency of choice, from the list of [place] Providers, already emailed and mailed to your home. Contact LCSW directly (# [phone]), if you have questions, need assistance, or if additional social work needs are identified in the near future.    No Follow-Up Required.     Nat Christen, BSW, MSW, LCSW  Licensed Education officer, [facility]  [place] [place] [address], [place], [place] [address] Physical Address-[address], [place], [place] [address] Toll Free Main # [phone] Fax # [phone] Cell # [phone] [email or web address]

## 2021-11-04 DIAGNOSIS — M7989 Other specified soft tissue disorders: Secondary | ICD-10-CM | POA: Diagnosis not present

## 2021-11-04 DIAGNOSIS — N390 Urinary tract infection, site not specified: Secondary | ICD-10-CM | POA: Diagnosis not present

## 2021-11-04 DIAGNOSIS — E1140 Type 2 diabetes mellitus with diabetic neuropathy, unspecified: Secondary | ICD-10-CM | POA: Diagnosis not present

## 2021-11-04 DIAGNOSIS — E559 Vitamin D deficiency, unspecified: Secondary | ICD-10-CM | POA: Diagnosis not present

## 2021-11-04 DIAGNOSIS — Z87898 Personal history of other specified conditions: Secondary | ICD-10-CM | POA: Diagnosis not present

## 2021-11-04 DIAGNOSIS — Z8673 Personal history of transient ischemic attack (TIA), and cerebral infarction without residual deficits: Secondary | ICD-10-CM | POA: Diagnosis not present

## 2021-11-05 ENCOUNTER — Other Ambulatory Visit: Payer: Self-pay

## 2021-11-05 DIAGNOSIS — F419 Anxiety disorder, unspecified: Secondary | ICD-10-CM | POA: Insufficient documentation

## 2021-11-05 DIAGNOSIS — J398 Other specified diseases of upper respiratory tract: Secondary | ICD-10-CM | POA: Diagnosis not present

## 2021-11-05 DIAGNOSIS — I219 Acute myocardial infarction, unspecified: Secondary | ICD-10-CM | POA: Insufficient documentation

## 2021-11-05 DIAGNOSIS — R600 Localized edema: Secondary | ICD-10-CM | POA: Insufficient documentation

## 2021-11-05 DIAGNOSIS — I6529 Occlusion and stenosis of unspecified carotid artery: Secondary | ICD-10-CM

## 2021-11-05 DIAGNOSIS — R200 Anesthesia of skin: Secondary | ICD-10-CM | POA: Insufficient documentation

## 2021-11-05 DIAGNOSIS — E119 Type 2 diabetes mellitus without complications: Secondary | ICD-10-CM

## 2021-11-05 DIAGNOSIS — G629 Polyneuropathy, unspecified: Secondary | ICD-10-CM | POA: Insufficient documentation

## 2021-11-05 DIAGNOSIS — G4730 Sleep apnea, unspecified: Secondary | ICD-10-CM | POA: Insufficient documentation

## 2021-11-05 DIAGNOSIS — R519 Headache, unspecified: Secondary | ICD-10-CM | POA: Insufficient documentation

## 2021-11-05 DIAGNOSIS — M5136 Other intervertebral disc degeneration, lumbar region: Secondary | ICD-10-CM | POA: Insufficient documentation

## 2021-11-05 DIAGNOSIS — R590 Localized enlarged lymph nodes: Secondary | ICD-10-CM | POA: Diagnosis not present

## 2021-11-05 DIAGNOSIS — G459 Transient cerebral ischemic attack, unspecified: Secondary | ICD-10-CM

## 2021-11-05 DIAGNOSIS — E079 Disorder of thyroid, unspecified: Secondary | ICD-10-CM | POA: Insufficient documentation

## 2021-11-05 DIAGNOSIS — R202 Paresthesia of skin: Secondary | ICD-10-CM | POA: Insufficient documentation

## 2021-11-05 DIAGNOSIS — I2510 Atherosclerotic heart disease of native coronary artery without angina pectoris: Secondary | ICD-10-CM | POA: Diagnosis not present

## 2021-11-05 DIAGNOSIS — I999 Unspecified disorder of circulatory system: Secondary | ICD-10-CM

## 2021-11-05 DIAGNOSIS — M51369 Other intervertebral disc degeneration, lumbar region without mention of lumbar back pain or lower extremity pain: Secondary | ICD-10-CM

## 2021-11-05 DIAGNOSIS — E559 Vitamin D deficiency, unspecified: Secondary | ICD-10-CM | POA: Insufficient documentation

## 2021-11-05 DIAGNOSIS — Z8709 Personal history of other diseases of the respiratory system: Secondary | ICD-10-CM | POA: Insufficient documentation

## 2021-11-05 DIAGNOSIS — K219 Gastro-esophageal reflux disease without esophagitis: Secondary | ICD-10-CM | POA: Insufficient documentation

## 2021-11-05 DIAGNOSIS — F32A Depression, unspecified: Secondary | ICD-10-CM | POA: Insufficient documentation

## 2021-11-05 DIAGNOSIS — M5030 Other cervical disc degeneration, unspecified cervical region: Secondary | ICD-10-CM

## 2021-11-05 DIAGNOSIS — I63431 Cerebral infarction due to embolism of right posterior cerebral artery: Secondary | ICD-10-CM | POA: Diagnosis not present

## 2021-11-05 DIAGNOSIS — R911 Solitary pulmonary nodule: Secondary | ICD-10-CM | POA: Diagnosis not present

## 2021-11-05 DIAGNOSIS — G47419 Narcolepsy without cataplexy: Secondary | ICD-10-CM | POA: Insufficient documentation

## 2021-11-05 HISTORY — DX: Personal history of other diseases of the respiratory system: Z87.09

## 2021-11-05 HISTORY — DX: Localized edema: R60.0

## 2021-11-05 HISTORY — DX: Other intervertebral disc degeneration, lumbar region without mention of lumbar back pain or lower extremity pain: M51.369

## 2021-11-05 HISTORY — DX: Headache, unspecified: R51.9

## 2021-11-05 HISTORY — DX: Type 2 diabetes mellitus without complications: E11.9

## 2021-11-05 HISTORY — DX: Other cervical disc degeneration, unspecified cervical region: M50.30

## 2021-11-05 HISTORY — DX: Occlusion and stenosis of unspecified carotid artery: I65.29

## 2021-11-05 HISTORY — DX: Sleep apnea, unspecified: G47.30

## 2021-11-05 HISTORY — DX: Anesthesia of skin: R20.0

## 2021-11-05 HISTORY — DX: Disorder of thyroid, unspecified: E07.9

## 2021-11-05 HISTORY — DX: Transient cerebral ischemic attack, unspecified: G45.9

## 2021-11-05 HISTORY — DX: Unspecified disorder of circulatory system: I99.9

## 2021-11-06 ENCOUNTER — Ambulatory Visit: Payer: Medicare Other | Admitting: Cardiology

## 2021-11-06 ENCOUNTER — Other Ambulatory Visit: Payer: Self-pay | Admitting: *Deleted

## 2021-11-06 DIAGNOSIS — Z79899 Other long term (current) drug therapy: Secondary | ICD-10-CM | POA: Diagnosis not present

## 2021-11-06 DIAGNOSIS — E1140 Type 2 diabetes mellitus with diabetic neuropathy, unspecified: Secondary | ICD-10-CM | POA: Diagnosis not present

## 2021-11-06 DIAGNOSIS — R309 Painful micturition, unspecified: Secondary | ICD-10-CM | POA: Diagnosis not present

## 2021-11-06 DIAGNOSIS — E559 Vitamin D deficiency, unspecified: Secondary | ICD-10-CM | POA: Diagnosis not present

## 2021-11-06 DIAGNOSIS — G894 Chronic pain syndrome: Secondary | ICD-10-CM | POA: Diagnosis not present

## 2021-11-06 NOTE — Patient Outreach (Addendum)
[facility]) Care Management ? ?11/06/2021 ? ?Barbara French ?Jan 04, 1956 ?[id number] ? ? ?Noted that member was admitted to [facility] from 3/9-3/20 with diagnosis of Acute toxic encephalopathy with concern for opioid/benzo withdrawal as well as alcohol withdrawal.  Also noted that she was supposed to discharge to [facility] and [facility].  Call placed to facility, informed that member was never admitted.   ? ?Outreach attempt #4, unsuccessful, HIPAA compliant voice message left.  No response from member after multiple unsuccessful outreach attempts and letter sent to both member and daughters.  Will close case at this time due to inability to maintain contact.  Will notify member and primary MD of case closure. ? ? ?[name], RN, MSN, CCM ?[facility] Care Management  ?Community Care Manager ?[phone] ? ?

## 2021-11-10 ENCOUNTER — Other Ambulatory Visit: Payer: Self-pay | Admitting: Orthopaedic Surgery

## 2021-11-10 DIAGNOSIS — M79605 Pain in left leg: Secondary | ICD-10-CM

## 2021-11-11 ENCOUNTER — Ambulatory Visit
Admission: RE | Admit: 2021-11-11 | Discharge: 2021-11-11 | Disposition: A | Discharge status: Home / Self Care | Payer: Medicare Other | Source: Ambulatory Visit | Attending: Orthopaedic Surgery | Admitting: Orthopaedic Surgery

## 2021-11-11 DIAGNOSIS — R600 Localized edema: Secondary | ICD-10-CM | POA: Diagnosis not present

## 2021-11-11 DIAGNOSIS — M79604 Pain in right leg: Secondary | ICD-10-CM

## 2021-12-02 ENCOUNTER — Ambulatory Visit: Payer: Medicare Other | Admitting: Cardiology

## 2021-12-03 DIAGNOSIS — M545 Low back pain, unspecified: Secondary | ICD-10-CM | POA: Diagnosis not present

## 2021-12-03 DIAGNOSIS — M542 Cervicalgia: Secondary | ICD-10-CM | POA: Diagnosis not present

## 2021-12-03 DIAGNOSIS — M5106 Intervertebral disc disorders with myelopathy, lumbar region: Secondary | ICD-10-CM | POA: Diagnosis not present

## 2021-12-03 DIAGNOSIS — G894 Chronic pain syndrome: Secondary | ICD-10-CM | POA: Diagnosis not present

## 2021-12-04 DIAGNOSIS — M1712 Unilateral primary osteoarthritis, left knee: Secondary | ICD-10-CM | POA: Diagnosis not present

## 2021-12-12 DIAGNOSIS — G9389 Other specified disorders of brain: Secondary | ICD-10-CM | POA: Diagnosis not present

## 2021-12-12 DIAGNOSIS — G319 Degenerative disease of nervous system, unspecified: Secondary | ICD-10-CM | POA: Diagnosis not present

## 2021-12-12 DIAGNOSIS — I69354 Hemiplegia and hemiparesis following cerebral infarction affecting left non-dominant side: Secondary | ICD-10-CM | POA: Diagnosis not present

## 2021-12-12 DIAGNOSIS — I2510 Atherosclerotic heart disease of native coronary artery without angina pectoris: Secondary | ICD-10-CM | POA: Diagnosis not present

## 2021-12-12 DIAGNOSIS — G9689 Other specified disorders of central nervous system: Secondary | ICD-10-CM | POA: Diagnosis not present

## 2021-12-12 DIAGNOSIS — Z743 Need for continuous supervision: Secondary | ICD-10-CM | POA: Diagnosis not present

## 2021-12-12 DIAGNOSIS — J690 Pneumonitis due to inhalation of food and vomit: Secondary | ICD-10-CM | POA: Diagnosis not present

## 2021-12-12 DIAGNOSIS — R29810 Facial weakness: Secondary | ICD-10-CM | POA: Diagnosis not present

## 2021-12-12 DIAGNOSIS — Y929 Unspecified place or not applicable: Secondary | ICD-10-CM | POA: Diagnosis not present

## 2021-12-12 DIAGNOSIS — G8929 Other chronic pain: Secondary | ICD-10-CM | POA: Diagnosis not present

## 2021-12-12 DIAGNOSIS — F17200 Nicotine dependence, unspecified, uncomplicated: Secondary | ICD-10-CM | POA: Diagnosis not present

## 2021-12-12 DIAGNOSIS — R4781 Slurred speech: Secondary | ICD-10-CM | POA: Diagnosis not present

## 2021-12-12 DIAGNOSIS — I672 Cerebral atherosclerosis: Secondary | ICD-10-CM | POA: Diagnosis not present

## 2021-12-12 DIAGNOSIS — Z86711 Personal history of pulmonary embolism: Secondary | ICD-10-CM | POA: Diagnosis not present

## 2021-12-12 DIAGNOSIS — R270 Ataxia, unspecified: Secondary | ICD-10-CM | POA: Diagnosis not present

## 2021-12-12 DIAGNOSIS — Z8679 Personal history of other diseases of the circulatory system: Secondary | ICD-10-CM | POA: Diagnosis not present

## 2021-12-12 DIAGNOSIS — I499 Cardiac arrhythmia, unspecified: Secondary | ICD-10-CM | POA: Diagnosis not present

## 2021-12-12 DIAGNOSIS — R29818 Other symptoms and signs involving the nervous system: Secondary | ICD-10-CM | POA: Diagnosis not present

## 2021-12-12 DIAGNOSIS — J811 Chronic pulmonary edema: Secondary | ICD-10-CM | POA: Diagnosis not present

## 2021-12-12 DIAGNOSIS — J9611 Chronic respiratory failure with hypoxia: Secondary | ICD-10-CM | POA: Diagnosis not present

## 2021-12-12 DIAGNOSIS — R253 Fasciculation: Secondary | ICD-10-CM | POA: Diagnosis not present

## 2021-12-12 DIAGNOSIS — E785 Hyperlipidemia, unspecified: Secondary | ICD-10-CM | POA: Diagnosis not present

## 2021-12-12 DIAGNOSIS — Z91128 Patient's intentional underdosing of medication regimen for other reason: Secondary | ICD-10-CM | POA: Diagnosis not present

## 2021-12-12 DIAGNOSIS — I10 Essential (primary) hypertension: Secondary | ICD-10-CM | POA: Diagnosis not present

## 2021-12-12 DIAGNOSIS — J9621 Acute and chronic respiratory failure with hypoxia: Secondary | ICD-10-CM | POA: Diagnosis not present

## 2021-12-12 DIAGNOSIS — M25562 Pain in left knee: Secondary | ICD-10-CM | POA: Diagnosis not present

## 2021-12-12 DIAGNOSIS — Z91148 Patient's other noncompliance with medication regimen for other reason: Secondary | ICD-10-CM | POA: Diagnosis not present

## 2021-12-12 DIAGNOSIS — I161 Hypertensive emergency: Secondary | ICD-10-CM | POA: Diagnosis not present

## 2021-12-12 DIAGNOSIS — R531 Weakness: Secondary | ICD-10-CM | POA: Diagnosis not present

## 2021-12-12 DIAGNOSIS — R55 Syncope and collapse: Secondary | ICD-10-CM | POA: Diagnosis not present

## 2021-12-12 DIAGNOSIS — W19XXXA Unspecified fall, initial encounter: Secondary | ICD-10-CM | POA: Diagnosis not present

## 2021-12-12 DIAGNOSIS — Z720 Tobacco use: Secondary | ICD-10-CM | POA: Diagnosis not present

## 2021-12-12 DIAGNOSIS — Z781 Physical restraint status: Secondary | ICD-10-CM | POA: Diagnosis not present

## 2021-12-12 DIAGNOSIS — R002 Palpitations: Secondary | ICD-10-CM | POA: Diagnosis not present

## 2021-12-12 DIAGNOSIS — E872 Acidosis, unspecified: Secondary | ICD-10-CM | POA: Diagnosis not present

## 2021-12-12 DIAGNOSIS — Z8673 Personal history of transient ischemic attack (TIA), and cerebral infarction without residual deficits: Secondary | ICD-10-CM | POA: Insufficient documentation

## 2021-12-12 DIAGNOSIS — R6889 Other general symptoms and signs: Secondary | ICD-10-CM | POA: Diagnosis not present

## 2021-12-12 DIAGNOSIS — J440 Chronic obstructive pulmonary disease with acute lower respiratory infection: Secondary | ICD-10-CM | POA: Diagnosis not present

## 2021-12-12 DIAGNOSIS — R2681 Unsteadiness on feet: Secondary | ICD-10-CM | POA: Diagnosis not present

## 2021-12-12 DIAGNOSIS — E119 Type 2 diabetes mellitus without complications: Secondary | ICD-10-CM | POA: Diagnosis not present

## 2021-12-12 DIAGNOSIS — J449 Chronic obstructive pulmonary disease, unspecified: Secondary | ICD-10-CM | POA: Diagnosis not present

## 2021-12-12 DIAGNOSIS — M6281 Muscle weakness (generalized): Secondary | ICD-10-CM | POA: Diagnosis not present

## 2021-12-12 DIAGNOSIS — R0902 Hypoxemia: Secondary | ICD-10-CM | POA: Diagnosis not present

## 2021-12-12 DIAGNOSIS — G250 Essential tremor: Secondary | ICD-10-CM | POA: Diagnosis not present

## 2021-12-12 DIAGNOSIS — T426X6A Underdosing of other antiepileptic and sedative-hypnotic drugs, initial encounter: Secondary | ICD-10-CM | POA: Diagnosis not present

## 2021-12-12 DIAGNOSIS — R0602 Shortness of breath: Secondary | ICD-10-CM | POA: Diagnosis not present

## 2021-12-12 DIAGNOSIS — M25511 Pain in right shoulder: Secondary | ICD-10-CM | POA: Diagnosis not present

## 2021-12-12 DIAGNOSIS — R569 Unspecified convulsions: Secondary | ICD-10-CM

## 2021-12-12 HISTORY — DX: Unspecified convulsions: R56.9

## 2021-12-12 HISTORY — DX: Personal history of transient ischemic attack (TIA), and cerebral infarction without residual deficits: Z86.73

## 2021-12-17 DIAGNOSIS — J449 Chronic obstructive pulmonary disease, unspecified: Secondary | ICD-10-CM | POA: Diagnosis not present

## 2021-12-17 DIAGNOSIS — R300 Dysuria: Secondary | ICD-10-CM | POA: Diagnosis not present

## 2021-12-17 DIAGNOSIS — E1149 Type 2 diabetes mellitus with other diabetic neurological complication: Secondary | ICD-10-CM | POA: Diagnosis not present

## 2021-12-17 DIAGNOSIS — I2510 Atherosclerotic heart disease of native coronary artery without angina pectoris: Secondary | ICD-10-CM | POA: Diagnosis not present

## 2021-12-17 DIAGNOSIS — Z91148 Patient's other noncompliance with medication regimen for other reason: Secondary | ICD-10-CM | POA: Diagnosis not present

## 2021-12-17 DIAGNOSIS — Z743 Need for continuous supervision: Secondary | ICD-10-CM | POA: Diagnosis not present

## 2021-12-17 DIAGNOSIS — J189 Pneumonia, unspecified organism: Secondary | ICD-10-CM

## 2021-12-17 DIAGNOSIS — F32A Depression, unspecified: Secondary | ICD-10-CM | POA: Diagnosis not present

## 2021-12-17 DIAGNOSIS — E785 Hyperlipidemia, unspecified: Secondary | ICD-10-CM | POA: Diagnosis not present

## 2021-12-17 DIAGNOSIS — R2681 Unsteadiness on feet: Secondary | ICD-10-CM | POA: Diagnosis not present

## 2021-12-17 DIAGNOSIS — N3000 Acute cystitis without hematuria: Secondary | ICD-10-CM | POA: Diagnosis not present

## 2021-12-17 DIAGNOSIS — K219 Gastro-esophageal reflux disease without esophagitis: Secondary | ICD-10-CM | POA: Diagnosis not present

## 2021-12-17 DIAGNOSIS — J9611 Chronic respiratory failure with hypoxia: Secondary | ICD-10-CM | POA: Diagnosis not present

## 2021-12-17 DIAGNOSIS — Z720 Tobacco use: Secondary | ICD-10-CM | POA: Diagnosis not present

## 2021-12-17 DIAGNOSIS — R569 Unspecified convulsions: Secondary | ICD-10-CM | POA: Diagnosis not present

## 2021-12-17 DIAGNOSIS — I6930 Unspecified sequelae of cerebral infarction: Secondary | ICD-10-CM | POA: Diagnosis not present

## 2021-12-17 DIAGNOSIS — R600 Localized edema: Secondary | ICD-10-CM | POA: Diagnosis not present

## 2021-12-17 DIAGNOSIS — R531 Weakness: Secondary | ICD-10-CM | POA: Diagnosis not present

## 2021-12-17 DIAGNOSIS — G8929 Other chronic pain: Secondary | ICD-10-CM | POA: Diagnosis not present

## 2021-12-17 DIAGNOSIS — E119 Type 2 diabetes mellitus without complications: Secondary | ICD-10-CM | POA: Diagnosis not present

## 2021-12-17 DIAGNOSIS — I10 Essential (primary) hypertension: Secondary | ICD-10-CM | POA: Diagnosis not present

## 2021-12-17 DIAGNOSIS — R6889 Other general symptoms and signs: Secondary | ICD-10-CM | POA: Diagnosis not present

## 2021-12-17 DIAGNOSIS — M6281 Muscle weakness (generalized): Secondary | ICD-10-CM | POA: Diagnosis not present

## 2021-12-17 HISTORY — DX: Pneumonia, unspecified organism: J18.9

## 2021-12-18 DIAGNOSIS — I10 Essential (primary) hypertension: Secondary | ICD-10-CM | POA: Diagnosis not present

## 2021-12-18 DIAGNOSIS — R569 Unspecified convulsions: Secondary | ICD-10-CM | POA: Diagnosis not present

## 2021-12-18 DIAGNOSIS — E785 Hyperlipidemia, unspecified: Secondary | ICD-10-CM | POA: Diagnosis not present

## 2021-12-18 DIAGNOSIS — I2510 Atherosclerotic heart disease of native coronary artery without angina pectoris: Secondary | ICD-10-CM | POA: Diagnosis not present

## 2021-12-18 DIAGNOSIS — E119 Type 2 diabetes mellitus without complications: Secondary | ICD-10-CM | POA: Diagnosis not present

## 2021-12-18 DIAGNOSIS — J9611 Chronic respiratory failure with hypoxia: Secondary | ICD-10-CM | POA: Diagnosis not present

## 2021-12-18 DIAGNOSIS — R2681 Unsteadiness on feet: Secondary | ICD-10-CM | POA: Diagnosis not present

## 2021-12-18 DIAGNOSIS — M6281 Muscle weakness (generalized): Secondary | ICD-10-CM | POA: Diagnosis not present

## 2021-12-18 DIAGNOSIS — J449 Chronic obstructive pulmonary disease, unspecified: Secondary | ICD-10-CM | POA: Diagnosis not present

## 2021-12-18 DIAGNOSIS — Z720 Tobacco use: Secondary | ICD-10-CM | POA: Diagnosis not present

## 2021-12-19 DIAGNOSIS — J449 Chronic obstructive pulmonary disease, unspecified: Secondary | ICD-10-CM | POA: Diagnosis not present

## 2021-12-19 DIAGNOSIS — E785 Hyperlipidemia, unspecified: Secondary | ICD-10-CM | POA: Diagnosis not present

## 2021-12-19 DIAGNOSIS — I2510 Atherosclerotic heart disease of native coronary artery without angina pectoris: Secondary | ICD-10-CM | POA: Diagnosis not present

## 2021-12-19 DIAGNOSIS — I10 Essential (primary) hypertension: Secondary | ICD-10-CM | POA: Diagnosis not present

## 2021-12-19 DIAGNOSIS — N3000 Acute cystitis without hematuria: Secondary | ICD-10-CM | POA: Diagnosis not present

## 2021-12-19 DIAGNOSIS — K219 Gastro-esophageal reflux disease without esophagitis: Secondary | ICD-10-CM | POA: Diagnosis not present

## 2021-12-19 DIAGNOSIS — R569 Unspecified convulsions: Secondary | ICD-10-CM | POA: Diagnosis not present

## 2021-12-19 DIAGNOSIS — E1149 Type 2 diabetes mellitus with other diabetic neurological complication: Secondary | ICD-10-CM | POA: Diagnosis not present

## 2021-12-19 DIAGNOSIS — G8929 Other chronic pain: Secondary | ICD-10-CM | POA: Diagnosis not present

## 2021-12-19 DIAGNOSIS — F32A Depression, unspecified: Secondary | ICD-10-CM | POA: Diagnosis not present

## 2021-12-19 DIAGNOSIS — I6930 Unspecified sequelae of cerebral infarction: Secondary | ICD-10-CM | POA: Diagnosis not present

## 2021-12-19 DIAGNOSIS — J9611 Chronic respiratory failure with hypoxia: Secondary | ICD-10-CM | POA: Diagnosis not present

## 2021-12-22 DIAGNOSIS — R300 Dysuria: Secondary | ICD-10-CM | POA: Diagnosis not present

## 2021-12-22 DIAGNOSIS — R600 Localized edema: Secondary | ICD-10-CM | POA: Diagnosis not present

## 2021-12-23 DIAGNOSIS — R569 Unspecified convulsions: Secondary | ICD-10-CM | POA: Diagnosis not present

## 2021-12-23 DIAGNOSIS — E119 Type 2 diabetes mellitus without complications: Secondary | ICD-10-CM | POA: Diagnosis not present

## 2021-12-23 DIAGNOSIS — M6281 Muscle weakness (generalized): Secondary | ICD-10-CM | POA: Diagnosis not present

## 2021-12-23 DIAGNOSIS — R2681 Unsteadiness on feet: Secondary | ICD-10-CM | POA: Diagnosis not present

## 2021-12-23 DIAGNOSIS — J9611 Chronic respiratory failure with hypoxia: Secondary | ICD-10-CM | POA: Diagnosis not present

## 2021-12-23 DIAGNOSIS — J449 Chronic obstructive pulmonary disease, unspecified: Secondary | ICD-10-CM | POA: Diagnosis not present

## 2021-12-24 DIAGNOSIS — R569 Unspecified convulsions: Secondary | ICD-10-CM | POA: Diagnosis not present

## 2021-12-24 DIAGNOSIS — J449 Chronic obstructive pulmonary disease, unspecified: Secondary | ICD-10-CM | POA: Diagnosis not present

## 2021-12-30 DIAGNOSIS — E119 Type 2 diabetes mellitus without complications: Secondary | ICD-10-CM | POA: Diagnosis not present

## 2021-12-30 DIAGNOSIS — R2681 Unsteadiness on feet: Secondary | ICD-10-CM | POA: Diagnosis not present

## 2021-12-30 DIAGNOSIS — J449 Chronic obstructive pulmonary disease, unspecified: Secondary | ICD-10-CM | POA: Diagnosis not present

## 2021-12-30 DIAGNOSIS — J9611 Chronic respiratory failure with hypoxia: Secondary | ICD-10-CM | POA: Diagnosis not present

## 2021-12-30 DIAGNOSIS — M6281 Muscle weakness (generalized): Secondary | ICD-10-CM | POA: Diagnosis not present

## 2021-12-30 DIAGNOSIS — R569 Unspecified convulsions: Secondary | ICD-10-CM | POA: Diagnosis not present

## 2021-12-31 DIAGNOSIS — I2510 Atherosclerotic heart disease of native coronary artery without angina pectoris: Secondary | ICD-10-CM | POA: Diagnosis not present

## 2021-12-31 DIAGNOSIS — K219 Gastro-esophageal reflux disease without esophagitis: Secondary | ICD-10-CM | POA: Diagnosis not present

## 2021-12-31 DIAGNOSIS — I6930 Unspecified sequelae of cerebral infarction: Secondary | ICD-10-CM | POA: Diagnosis not present

## 2021-12-31 DIAGNOSIS — E785 Hyperlipidemia, unspecified: Secondary | ICD-10-CM | POA: Diagnosis not present

## 2021-12-31 DIAGNOSIS — I10 Essential (primary) hypertension: Secondary | ICD-10-CM | POA: Diagnosis not present

## 2021-12-31 DIAGNOSIS — R569 Unspecified convulsions: Secondary | ICD-10-CM | POA: Diagnosis not present

## 2021-12-31 DIAGNOSIS — E1149 Type 2 diabetes mellitus with other diabetic neurological complication: Secondary | ICD-10-CM | POA: Diagnosis not present

## 2021-12-31 DIAGNOSIS — J9611 Chronic respiratory failure with hypoxia: Secondary | ICD-10-CM | POA: Diagnosis not present

## 2021-12-31 DIAGNOSIS — J449 Chronic obstructive pulmonary disease, unspecified: Secondary | ICD-10-CM | POA: Diagnosis not present

## 2021-12-31 DIAGNOSIS — Z720 Tobacco use: Secondary | ICD-10-CM | POA: Diagnosis not present

## 2021-12-31 DIAGNOSIS — F32A Depression, unspecified: Secondary | ICD-10-CM | POA: Diagnosis not present

## 2022-01-01 DIAGNOSIS — E119 Type 2 diabetes mellitus without complications: Secondary | ICD-10-CM | POA: Diagnosis not present

## 2022-01-01 DIAGNOSIS — R2681 Unsteadiness on feet: Secondary | ICD-10-CM | POA: Diagnosis not present

## 2022-01-01 DIAGNOSIS — R569 Unspecified convulsions: Secondary | ICD-10-CM | POA: Diagnosis not present

## 2022-01-01 DIAGNOSIS — J9611 Chronic respiratory failure with hypoxia: Secondary | ICD-10-CM | POA: Diagnosis not present

## 2022-01-01 DIAGNOSIS — M6281 Muscle weakness (generalized): Secondary | ICD-10-CM | POA: Diagnosis not present

## 2022-01-01 DIAGNOSIS — J449 Chronic obstructive pulmonary disease, unspecified: Secondary | ICD-10-CM | POA: Diagnosis not present

## 2022-01-02 DIAGNOSIS — J449 Chronic obstructive pulmonary disease, unspecified: Secondary | ICD-10-CM | POA: Diagnosis not present

## 2022-01-13 DIAGNOSIS — Z09 Encounter for follow-up examination after completed treatment for conditions other than malignant neoplasm: Secondary | ICD-10-CM | POA: Diagnosis not present

## 2022-01-13 DIAGNOSIS — S91302A Unspecified open wound, left foot, initial encounter: Secondary | ICD-10-CM | POA: Diagnosis not present

## 2022-01-13 DIAGNOSIS — E559 Vitamin D deficiency, unspecified: Secondary | ICD-10-CM | POA: Diagnosis not present

## 2022-01-13 DIAGNOSIS — E1151 Type 2 diabetes mellitus with diabetic peripheral angiopathy without gangrene: Secondary | ICD-10-CM | POA: Diagnosis not present

## 2022-01-13 DIAGNOSIS — R42 Dizziness and giddiness: Secondary | ICD-10-CM | POA: Diagnosis not present

## 2022-01-13 DIAGNOSIS — Z79899 Other long term (current) drug therapy: Secondary | ICD-10-CM | POA: Diagnosis not present

## 2022-01-20 DIAGNOSIS — M542 Cervicalgia: Secondary | ICD-10-CM | POA: Diagnosis not present

## 2022-01-20 DIAGNOSIS — G894 Chronic pain syndrome: Secondary | ICD-10-CM | POA: Diagnosis not present

## 2022-01-20 DIAGNOSIS — M545 Low back pain, unspecified: Secondary | ICD-10-CM | POA: Diagnosis not present

## 2022-02-02 DIAGNOSIS — Z79899 Other long term (current) drug therapy: Secondary | ICD-10-CM | POA: Diagnosis not present

## 2022-02-02 DIAGNOSIS — G43909 Migraine, unspecified, not intractable, without status migrainosus: Secondary | ICD-10-CM | POA: Diagnosis not present

## 2022-02-02 DIAGNOSIS — J449 Chronic obstructive pulmonary disease, unspecified: Secondary | ICD-10-CM | POA: Diagnosis not present

## 2022-02-02 DIAGNOSIS — Z8673 Personal history of transient ischemic attack (TIA), and cerebral infarction without residual deficits: Secondary | ICD-10-CM | POA: Diagnosis not present

## 2022-02-02 DIAGNOSIS — R569 Unspecified convulsions: Secondary | ICD-10-CM | POA: Diagnosis not present

## 2022-02-02 DIAGNOSIS — R413 Other amnesia: Secondary | ICD-10-CM | POA: Diagnosis not present

## 2022-02-02 DIAGNOSIS — G40909 Epilepsy, unspecified, not intractable, without status epilepticus: Secondary | ICD-10-CM | POA: Insufficient documentation

## 2022-02-02 HISTORY — DX: Epilepsy, unspecified, not intractable, without status epilepticus: G40.909

## 2022-02-15 DIAGNOSIS — W19XXXA Unspecified fall, initial encounter: Secondary | ICD-10-CM | POA: Diagnosis not present

## 2022-02-15 DIAGNOSIS — S60221A Contusion of right hand, initial encounter: Secondary | ICD-10-CM | POA: Diagnosis not present

## 2022-02-15 DIAGNOSIS — M542 Cervicalgia: Secondary | ICD-10-CM | POA: Diagnosis not present

## 2022-02-15 DIAGNOSIS — R6889 Other general symptoms and signs: Secondary | ICD-10-CM | POA: Diagnosis not present

## 2022-02-15 DIAGNOSIS — G319 Degenerative disease of nervous system, unspecified: Secondary | ICD-10-CM | POA: Diagnosis not present

## 2022-02-15 DIAGNOSIS — Z743 Need for continuous supervision: Secondary | ICD-10-CM | POA: Diagnosis not present

## 2022-02-15 DIAGNOSIS — Z981 Arthrodesis status: Secondary | ICD-10-CM | POA: Diagnosis not present

## 2022-02-15 DIAGNOSIS — S300XXA Contusion of lower back and pelvis, initial encounter: Secondary | ICD-10-CM | POA: Diagnosis not present

## 2022-02-15 DIAGNOSIS — J984 Other disorders of lung: Secondary | ICD-10-CM | POA: Diagnosis not present

## 2022-02-15 DIAGNOSIS — I639 Cerebral infarction, unspecified: Secondary | ICD-10-CM | POA: Diagnosis not present

## 2022-02-15 DIAGNOSIS — M545 Low back pain, unspecified: Secondary | ICD-10-CM | POA: Diagnosis not present

## 2022-02-15 DIAGNOSIS — R911 Solitary pulmonary nodule: Secondary | ICD-10-CM | POA: Diagnosis not present

## 2022-02-15 DIAGNOSIS — Z043 Encounter for examination and observation following other accident: Secondary | ICD-10-CM | POA: Diagnosis not present

## 2022-02-23 DIAGNOSIS — R079 Chest pain, unspecified: Secondary | ICD-10-CM | POA: Diagnosis not present

## 2022-02-23 DIAGNOSIS — E1149 Type 2 diabetes mellitus with other diabetic neurological complication: Secondary | ICD-10-CM | POA: Diagnosis not present

## 2022-02-23 DIAGNOSIS — G8929 Other chronic pain: Secondary | ICD-10-CM | POA: Diagnosis not present

## 2022-02-23 DIAGNOSIS — L985 Mucinosis of the skin: Secondary | ICD-10-CM | POA: Diagnosis not present

## 2022-02-23 DIAGNOSIS — G0490 Encephalitis and encephalomyelitis, unspecified: Secondary | ICD-10-CM | POA: Diagnosis not present

## 2022-02-23 DIAGNOSIS — M542 Cervicalgia: Secondary | ICD-10-CM | POA: Diagnosis not present

## 2022-02-23 DIAGNOSIS — R569 Unspecified convulsions: Secondary | ICD-10-CM | POA: Diagnosis not present

## 2022-02-23 DIAGNOSIS — Z7984 Long term (current) use of oral hypoglycemic drugs: Secondary | ICD-10-CM | POA: Diagnosis not present

## 2022-02-23 DIAGNOSIS — R000 Tachycardia, unspecified: Secondary | ICD-10-CM | POA: Diagnosis not present

## 2022-02-23 DIAGNOSIS — R6889 Other general symptoms and signs: Secondary | ICD-10-CM | POA: Diagnosis not present

## 2022-02-23 DIAGNOSIS — Z86718 Personal history of other venous thrombosis and embolism: Secondary | ICD-10-CM | POA: Diagnosis not present

## 2022-02-23 DIAGNOSIS — I2510 Atherosclerotic heart disease of native coronary artery without angina pectoris: Secondary | ICD-10-CM | POA: Diagnosis not present

## 2022-02-23 DIAGNOSIS — R410 Disorientation, unspecified: Secondary | ICD-10-CM | POA: Diagnosis not present

## 2022-02-23 DIAGNOSIS — I6930 Unspecified sequelae of cerebral infarction: Secondary | ICD-10-CM | POA: Diagnosis not present

## 2022-02-23 DIAGNOSIS — J9601 Acute respiratory failure with hypoxia: Secondary | ICD-10-CM | POA: Diagnosis not present

## 2022-02-23 DIAGNOSIS — I499 Cardiac arrhythmia, unspecified: Secondary | ICD-10-CM | POA: Diagnosis not present

## 2022-02-23 DIAGNOSIS — R404 Transient alteration of awareness: Secondary | ICD-10-CM | POA: Diagnosis not present

## 2022-02-23 DIAGNOSIS — F32A Depression, unspecified: Secondary | ICD-10-CM | POA: Diagnosis not present

## 2022-02-23 DIAGNOSIS — Z981 Arthrodesis status: Secondary | ICD-10-CM | POA: Diagnosis not present

## 2022-02-23 DIAGNOSIS — R1312 Dysphagia, oropharyngeal phase: Secondary | ICD-10-CM | POA: Diagnosis not present

## 2022-02-23 DIAGNOSIS — Z7901 Long term (current) use of anticoagulants: Secondary | ICD-10-CM | POA: Diagnosis not present

## 2022-02-23 DIAGNOSIS — D696 Thrombocytopenia, unspecified: Secondary | ICD-10-CM | POA: Diagnosis not present

## 2022-02-23 DIAGNOSIS — Z86711 Personal history of pulmonary embolism: Secondary | ICD-10-CM | POA: Diagnosis not present

## 2022-02-23 DIAGNOSIS — M545 Low back pain, unspecified: Secondary | ICD-10-CM | POA: Diagnosis not present

## 2022-02-23 DIAGNOSIS — E1151 Type 2 diabetes mellitus with diabetic peripheral angiopathy without gangrene: Secondary | ICD-10-CM | POA: Diagnosis not present

## 2022-02-23 DIAGNOSIS — G928 Other toxic encephalopathy: Secondary | ICD-10-CM | POA: Diagnosis not present

## 2022-02-23 DIAGNOSIS — Z7982 Long term (current) use of aspirin: Secondary | ICD-10-CM | POA: Diagnosis not present

## 2022-02-23 DIAGNOSIS — D72829 Elevated white blood cell count, unspecified: Secondary | ICD-10-CM | POA: Diagnosis not present

## 2022-02-23 DIAGNOSIS — R231 Pallor: Secondary | ICD-10-CM | POA: Diagnosis not present

## 2022-02-23 DIAGNOSIS — R58 Hemorrhage, not elsewhere classified: Secondary | ICD-10-CM | POA: Diagnosis not present

## 2022-02-23 DIAGNOSIS — Z743 Need for continuous supervision: Secondary | ICD-10-CM | POA: Diagnosis not present

## 2022-02-23 DIAGNOSIS — R2681 Unsteadiness on feet: Secondary | ICD-10-CM | POA: Diagnosis not present

## 2022-02-23 DIAGNOSIS — E785 Hyperlipidemia, unspecified: Secondary | ICD-10-CM | POA: Diagnosis not present

## 2022-02-23 DIAGNOSIS — Z8673 Personal history of transient ischemic attack (TIA), and cerebral infarction without residual deficits: Secondary | ICD-10-CM | POA: Diagnosis not present

## 2022-02-23 DIAGNOSIS — Z720 Tobacco use: Secondary | ICD-10-CM | POA: Diagnosis not present

## 2022-02-23 DIAGNOSIS — Z96651 Presence of right artificial knee joint: Secondary | ICD-10-CM | POA: Diagnosis not present

## 2022-02-23 DIAGNOSIS — R262 Difficulty in walking, not elsewhere classified: Secondary | ICD-10-CM | POA: Diagnosis not present

## 2022-02-23 DIAGNOSIS — K219 Gastro-esophageal reflux disease without esophagitis: Secondary | ICD-10-CM | POA: Diagnosis not present

## 2022-02-23 DIAGNOSIS — F17210 Nicotine dependence, cigarettes, uncomplicated: Secondary | ICD-10-CM | POA: Diagnosis not present

## 2022-02-23 DIAGNOSIS — Z043 Encounter for examination and observation following other accident: Secondary | ICD-10-CM | POA: Diagnosis not present

## 2022-02-23 DIAGNOSIS — Z20822 Contact with and (suspected) exposure to covid-19: Secondary | ICD-10-CM | POA: Diagnosis not present

## 2022-02-23 DIAGNOSIS — E871 Hypo-osmolality and hyponatremia: Secondary | ICD-10-CM | POA: Diagnosis not present

## 2022-02-23 DIAGNOSIS — Z79899 Other long term (current) drug therapy: Secondary | ICD-10-CM | POA: Diagnosis not present

## 2022-02-23 DIAGNOSIS — S0990XA Unspecified injury of head, initial encounter: Secondary | ICD-10-CM | POA: Diagnosis not present

## 2022-02-23 DIAGNOSIS — Z8616 Personal history of COVID-19: Secondary | ICD-10-CM | POA: Diagnosis not present

## 2022-02-23 DIAGNOSIS — R6510 Systemic inflammatory response syndrome (SIRS) of non-infectious origin without acute organ dysfunction: Secondary | ICD-10-CM | POA: Diagnosis not present

## 2022-02-23 DIAGNOSIS — C3411 Malignant neoplasm of upper lobe, right bronchus or lung: Secondary | ICD-10-CM | POA: Diagnosis not present

## 2022-02-23 DIAGNOSIS — E860 Dehydration: Secondary | ICD-10-CM | POA: Diagnosis not present

## 2022-02-23 DIAGNOSIS — J9611 Chronic respiratory failure with hypoxia: Secondary | ICD-10-CM | POA: Diagnosis not present

## 2022-02-23 DIAGNOSIS — R633 Feeding difficulties, unspecified: Secondary | ICD-10-CM | POA: Diagnosis not present

## 2022-02-23 DIAGNOSIS — I10 Essential (primary) hypertension: Secondary | ICD-10-CM | POA: Diagnosis not present

## 2022-02-23 DIAGNOSIS — Z794 Long term (current) use of insulin: Secondary | ICD-10-CM | POA: Diagnosis not present

## 2022-02-23 DIAGNOSIS — S300XXA Contusion of lower back and pelvis, initial encounter: Secondary | ICD-10-CM | POA: Diagnosis not present

## 2022-02-23 DIAGNOSIS — Z8249 Family history of ischemic heart disease and other diseases of the circulatory system: Secondary | ICD-10-CM | POA: Diagnosis not present

## 2022-02-23 DIAGNOSIS — E039 Hypothyroidism, unspecified: Secondary | ICD-10-CM | POA: Diagnosis not present

## 2022-02-23 DIAGNOSIS — A419 Sepsis, unspecified organism: Secondary | ICD-10-CM | POA: Diagnosis not present

## 2022-02-23 DIAGNOSIS — J449 Chronic obstructive pulmonary disease, unspecified: Secondary | ICD-10-CM | POA: Diagnosis not present

## 2022-02-23 DIAGNOSIS — G9341 Metabolic encephalopathy: Secondary | ICD-10-CM | POA: Diagnosis not present

## 2022-02-23 DIAGNOSIS — Z9981 Dependence on supplemental oxygen: Secondary | ICD-10-CM | POA: Diagnosis not present

## 2022-02-23 DIAGNOSIS — R21 Rash and other nonspecific skin eruption: Secondary | ICD-10-CM | POA: Diagnosis not present

## 2022-02-23 DIAGNOSIS — J9811 Atelectasis: Secondary | ICD-10-CM | POA: Diagnosis not present

## 2022-02-23 DIAGNOSIS — F17290 Nicotine dependence, other tobacco product, uncomplicated: Secondary | ICD-10-CM | POA: Diagnosis not present

## 2022-02-23 DIAGNOSIS — G9340 Encephalopathy, unspecified: Secondary | ICD-10-CM | POA: Diagnosis not present

## 2022-02-23 DIAGNOSIS — R531 Weakness: Secondary | ICD-10-CM | POA: Diagnosis not present

## 2022-02-23 DIAGNOSIS — R6520 Severe sepsis without septic shock: Secondary | ICD-10-CM | POA: Diagnosis not present

## 2022-02-23 DIAGNOSIS — Z4659 Encounter for fitting and adjustment of other gastrointestinal appliance and device: Secondary | ICD-10-CM | POA: Diagnosis not present

## 2022-02-23 DIAGNOSIS — A938 Other specified arthropod-borne viral fevers: Secondary | ICD-10-CM | POA: Diagnosis not present

## 2022-02-23 DIAGNOSIS — E119 Type 2 diabetes mellitus without complications: Secondary | ICD-10-CM | POA: Diagnosis not present

## 2022-02-23 DIAGNOSIS — Z833 Family history of diabetes mellitus: Secondary | ICD-10-CM | POA: Diagnosis not present

## 2022-02-23 DIAGNOSIS — M6281 Muscle weakness (generalized): Secondary | ICD-10-CM | POA: Diagnosis not present

## 2022-02-23 DIAGNOSIS — D7212 Drug rash with eosinophilia and systemic symptoms syndrome: Secondary | ICD-10-CM | POA: Diagnosis not present

## 2022-02-23 DIAGNOSIS — R911 Solitary pulmonary nodule: Secondary | ICD-10-CM | POA: Diagnosis not present

## 2022-02-24 DIAGNOSIS — Z7901 Long term (current) use of anticoagulants: Secondary | ICD-10-CM | POA: Diagnosis not present

## 2022-02-24 DIAGNOSIS — L985 Mucinosis of the skin: Secondary | ICD-10-CM | POA: Diagnosis not present

## 2022-02-24 DIAGNOSIS — I10 Essential (primary) hypertension: Secondary | ICD-10-CM | POA: Diagnosis not present

## 2022-02-24 DIAGNOSIS — R231 Pallor: Secondary | ICD-10-CM | POA: Diagnosis not present

## 2022-02-24 DIAGNOSIS — R6510 Systemic inflammatory response syndrome (SIRS) of non-infectious origin without acute organ dysfunction: Secondary | ICD-10-CM | POA: Diagnosis not present

## 2022-02-24 DIAGNOSIS — Z794 Long term (current) use of insulin: Secondary | ICD-10-CM | POA: Diagnosis not present

## 2022-02-24 DIAGNOSIS — E785 Hyperlipidemia, unspecified: Secondary | ICD-10-CM | POA: Diagnosis not present

## 2022-02-24 DIAGNOSIS — R21 Rash and other nonspecific skin eruption: Secondary | ICD-10-CM | POA: Diagnosis not present

## 2022-02-24 DIAGNOSIS — E119 Type 2 diabetes mellitus without complications: Secondary | ICD-10-CM | POA: Diagnosis not present

## 2022-02-24 DIAGNOSIS — J449 Chronic obstructive pulmonary disease, unspecified: Secondary | ICD-10-CM | POA: Diagnosis not present

## 2022-02-24 DIAGNOSIS — G9340 Encephalopathy, unspecified: Secondary | ICD-10-CM | POA: Diagnosis not present

## 2022-02-24 DIAGNOSIS — R911 Solitary pulmonary nodule: Secondary | ICD-10-CM | POA: Diagnosis not present

## 2022-02-24 DIAGNOSIS — E871 Hypo-osmolality and hyponatremia: Secondary | ICD-10-CM | POA: Diagnosis not present

## 2022-02-25 DIAGNOSIS — Z4659 Encounter for fitting and adjustment of other gastrointestinal appliance and device: Secondary | ICD-10-CM | POA: Diagnosis not present

## 2022-02-25 DIAGNOSIS — R231 Pallor: Secondary | ICD-10-CM | POA: Diagnosis not present

## 2022-02-25 DIAGNOSIS — L985 Mucinosis of the skin: Secondary | ICD-10-CM | POA: Diagnosis not present

## 2022-02-26 DIAGNOSIS — G0490 Encephalitis and encephalomyelitis, unspecified: Secondary | ICD-10-CM | POA: Diagnosis not present

## 2022-02-27 DIAGNOSIS — Z7901 Long term (current) use of anticoagulants: Secondary | ICD-10-CM | POA: Diagnosis not present

## 2022-02-27 DIAGNOSIS — R6510 Systemic inflammatory response syndrome (SIRS) of non-infectious origin without acute organ dysfunction: Secondary | ICD-10-CM | POA: Diagnosis not present

## 2022-02-27 DIAGNOSIS — R21 Rash and other nonspecific skin eruption: Secondary | ICD-10-CM | POA: Diagnosis not present

## 2022-02-27 DIAGNOSIS — G9340 Encephalopathy, unspecified: Secondary | ICD-10-CM | POA: Diagnosis not present

## 2022-02-27 DIAGNOSIS — E871 Hypo-osmolality and hyponatremia: Secondary | ICD-10-CM | POA: Diagnosis not present

## 2022-02-27 DIAGNOSIS — I10 Essential (primary) hypertension: Secondary | ICD-10-CM | POA: Diagnosis not present

## 2022-02-27 DIAGNOSIS — E785 Hyperlipidemia, unspecified: Secondary | ICD-10-CM | POA: Diagnosis not present

## 2022-02-27 DIAGNOSIS — R911 Solitary pulmonary nodule: Secondary | ICD-10-CM | POA: Diagnosis not present

## 2022-02-27 DIAGNOSIS — R569 Unspecified convulsions: Secondary | ICD-10-CM | POA: Diagnosis not present

## 2022-02-27 DIAGNOSIS — Z794 Long term (current) use of insulin: Secondary | ICD-10-CM | POA: Diagnosis not present

## 2022-02-27 DIAGNOSIS — J449 Chronic obstructive pulmonary disease, unspecified: Secondary | ICD-10-CM | POA: Diagnosis not present

## 2022-02-28 DIAGNOSIS — A419 Sepsis, unspecified organism: Secondary | ICD-10-CM | POA: Diagnosis not present

## 2022-02-28 DIAGNOSIS — J449 Chronic obstructive pulmonary disease, unspecified: Secondary | ICD-10-CM | POA: Diagnosis not present

## 2022-02-28 DIAGNOSIS — R569 Unspecified convulsions: Secondary | ICD-10-CM | POA: Diagnosis not present

## 2022-02-28 DIAGNOSIS — E785 Hyperlipidemia, unspecified: Secondary | ICD-10-CM | POA: Diagnosis not present

## 2022-02-28 DIAGNOSIS — R911 Solitary pulmonary nodule: Secondary | ICD-10-CM | POA: Diagnosis not present

## 2022-02-28 DIAGNOSIS — Z9981 Dependence on supplemental oxygen: Secondary | ICD-10-CM | POA: Diagnosis not present

## 2022-02-28 DIAGNOSIS — I10 Essential (primary) hypertension: Secondary | ICD-10-CM | POA: Diagnosis not present

## 2022-02-28 DIAGNOSIS — Z794 Long term (current) use of insulin: Secondary | ICD-10-CM | POA: Diagnosis not present

## 2022-02-28 DIAGNOSIS — J9601 Acute respiratory failure with hypoxia: Secondary | ICD-10-CM | POA: Diagnosis not present

## 2022-02-28 DIAGNOSIS — G928 Other toxic encephalopathy: Secondary | ICD-10-CM | POA: Diagnosis not present

## 2022-03-01 DIAGNOSIS — A419 Sepsis, unspecified organism: Secondary | ICD-10-CM | POA: Diagnosis not present

## 2022-03-01 DIAGNOSIS — G928 Other toxic encephalopathy: Secondary | ICD-10-CM | POA: Diagnosis not present

## 2022-03-01 DIAGNOSIS — J9601 Acute respiratory failure with hypoxia: Secondary | ICD-10-CM | POA: Diagnosis not present

## 2022-03-01 DIAGNOSIS — R569 Unspecified convulsions: Secondary | ICD-10-CM | POA: Diagnosis not present

## 2022-03-01 DIAGNOSIS — I10 Essential (primary) hypertension: Secondary | ICD-10-CM | POA: Diagnosis not present

## 2022-03-02 DIAGNOSIS — Z9981 Dependence on supplemental oxygen: Secondary | ICD-10-CM | POA: Diagnosis not present

## 2022-03-02 DIAGNOSIS — J449 Chronic obstructive pulmonary disease, unspecified: Secondary | ICD-10-CM | POA: Diagnosis not present

## 2022-03-02 DIAGNOSIS — Z794 Long term (current) use of insulin: Secondary | ICD-10-CM | POA: Diagnosis not present

## 2022-03-02 DIAGNOSIS — R911 Solitary pulmonary nodule: Secondary | ICD-10-CM | POA: Diagnosis not present

## 2022-03-02 DIAGNOSIS — R569 Unspecified convulsions: Secondary | ICD-10-CM | POA: Diagnosis not present

## 2022-03-02 DIAGNOSIS — E785 Hyperlipidemia, unspecified: Secondary | ICD-10-CM | POA: Diagnosis not present

## 2022-03-02 DIAGNOSIS — J9601 Acute respiratory failure with hypoxia: Secondary | ICD-10-CM | POA: Diagnosis not present

## 2022-03-02 DIAGNOSIS — A419 Sepsis, unspecified organism: Secondary | ICD-10-CM | POA: Diagnosis not present

## 2022-03-02 DIAGNOSIS — G928 Other toxic encephalopathy: Secondary | ICD-10-CM | POA: Diagnosis not present

## 2022-03-02 DIAGNOSIS — I10 Essential (primary) hypertension: Secondary | ICD-10-CM | POA: Diagnosis not present

## 2022-03-03 DIAGNOSIS — R6520 Severe sepsis without septic shock: Secondary | ICD-10-CM | POA: Diagnosis not present

## 2022-03-03 DIAGNOSIS — I10 Essential (primary) hypertension: Secondary | ICD-10-CM | POA: Diagnosis not present

## 2022-03-03 DIAGNOSIS — E785 Hyperlipidemia, unspecified: Secondary | ICD-10-CM | POA: Diagnosis not present

## 2022-03-03 DIAGNOSIS — D72829 Elevated white blood cell count, unspecified: Secondary | ICD-10-CM | POA: Diagnosis not present

## 2022-03-03 DIAGNOSIS — E119 Type 2 diabetes mellitus without complications: Secondary | ICD-10-CM | POA: Diagnosis not present

## 2022-03-03 DIAGNOSIS — G928 Other toxic encephalopathy: Secondary | ICD-10-CM | POA: Diagnosis not present

## 2022-03-03 DIAGNOSIS — F17210 Nicotine dependence, cigarettes, uncomplicated: Secondary | ICD-10-CM | POA: Diagnosis not present

## 2022-03-03 DIAGNOSIS — A419 Sepsis, unspecified organism: Secondary | ICD-10-CM | POA: Diagnosis not present

## 2022-03-03 DIAGNOSIS — J449 Chronic obstructive pulmonary disease, unspecified: Secondary | ICD-10-CM | POA: Diagnosis not present

## 2022-03-03 DIAGNOSIS — Z7984 Long term (current) use of oral hypoglycemic drugs: Secondary | ICD-10-CM | POA: Diagnosis not present

## 2022-03-04 DIAGNOSIS — D72829 Elevated white blood cell count, unspecified: Secondary | ICD-10-CM | POA: Diagnosis not present

## 2022-03-04 DIAGNOSIS — G928 Other toxic encephalopathy: Secondary | ICD-10-CM | POA: Diagnosis not present

## 2022-03-04 DIAGNOSIS — E119 Type 2 diabetes mellitus without complications: Secondary | ICD-10-CM | POA: Diagnosis not present

## 2022-03-04 DIAGNOSIS — A419 Sepsis, unspecified organism: Secondary | ICD-10-CM | POA: Diagnosis not present

## 2022-03-04 DIAGNOSIS — J449 Chronic obstructive pulmonary disease, unspecified: Secondary | ICD-10-CM | POA: Diagnosis not present

## 2022-03-04 DIAGNOSIS — R6520 Severe sepsis without septic shock: Secondary | ICD-10-CM | POA: Diagnosis not present

## 2022-03-04 DIAGNOSIS — I10 Essential (primary) hypertension: Secondary | ICD-10-CM | POA: Diagnosis not present

## 2022-03-04 DIAGNOSIS — Z7984 Long term (current) use of oral hypoglycemic drugs: Secondary | ICD-10-CM | POA: Diagnosis not present

## 2022-03-04 DIAGNOSIS — E785 Hyperlipidemia, unspecified: Secondary | ICD-10-CM | POA: Diagnosis not present

## 2022-03-04 DIAGNOSIS — J9811 Atelectasis: Secondary | ICD-10-CM | POA: Diagnosis not present

## 2022-03-04 DIAGNOSIS — R633 Feeding difficulties, unspecified: Secondary | ICD-10-CM | POA: Diagnosis not present

## 2022-03-04 DIAGNOSIS — F17210 Nicotine dependence, cigarettes, uncomplicated: Secondary | ICD-10-CM | POA: Diagnosis not present

## 2022-03-04 DIAGNOSIS — R1312 Dysphagia, oropharyngeal phase: Secondary | ICD-10-CM | POA: Diagnosis not present

## 2022-03-05 DIAGNOSIS — I10 Essential (primary) hypertension: Secondary | ICD-10-CM | POA: Diagnosis not present

## 2022-03-05 DIAGNOSIS — I2510 Atherosclerotic heart disease of native coronary artery without angina pectoris: Secondary | ICD-10-CM | POA: Diagnosis not present

## 2022-03-05 DIAGNOSIS — J9611 Chronic respiratory failure with hypoxia: Secondary | ICD-10-CM | POA: Diagnosis not present

## 2022-03-05 DIAGNOSIS — J449 Chronic obstructive pulmonary disease, unspecified: Secondary | ICD-10-CM | POA: Diagnosis not present

## 2022-03-05 DIAGNOSIS — I6930 Unspecified sequelae of cerebral infarction: Secondary | ICD-10-CM | POA: Diagnosis not present

## 2022-03-05 DIAGNOSIS — A938 Other specified arthropod-borne viral fevers: Secondary | ICD-10-CM | POA: Diagnosis not present

## 2022-03-05 DIAGNOSIS — R404 Transient alteration of awareness: Secondary | ICD-10-CM | POA: Diagnosis not present

## 2022-03-05 DIAGNOSIS — R569 Unspecified convulsions: Secondary | ICD-10-CM | POA: Diagnosis not present

## 2022-03-05 DIAGNOSIS — Z743 Need for continuous supervision: Secondary | ICD-10-CM | POA: Diagnosis not present

## 2022-03-05 DIAGNOSIS — F32A Depression, unspecified: Secondary | ICD-10-CM | POA: Diagnosis not present

## 2022-03-05 DIAGNOSIS — R2681 Unsteadiness on feet: Secondary | ICD-10-CM | POA: Diagnosis not present

## 2022-03-05 DIAGNOSIS — R531 Weakness: Secondary | ICD-10-CM | POA: Diagnosis not present

## 2022-03-05 DIAGNOSIS — K219 Gastro-esophageal reflux disease without esophagitis: Secondary | ICD-10-CM | POA: Diagnosis not present

## 2022-03-05 DIAGNOSIS — G9341 Metabolic encephalopathy: Secondary | ICD-10-CM | POA: Diagnosis not present

## 2022-03-05 DIAGNOSIS — E1149 Type 2 diabetes mellitus with other diabetic neurological complication: Secondary | ICD-10-CM | POA: Diagnosis not present

## 2022-03-05 DIAGNOSIS — R911 Solitary pulmonary nodule: Secondary | ICD-10-CM | POA: Diagnosis not present

## 2022-03-05 DIAGNOSIS — D72829 Elevated white blood cell count, unspecified: Secondary | ICD-10-CM | POA: Diagnosis not present

## 2022-03-05 DIAGNOSIS — R262 Difficulty in walking, not elsewhere classified: Secondary | ICD-10-CM | POA: Diagnosis not present

## 2022-03-05 DIAGNOSIS — D7212 Drug rash with eosinophilia and systemic symptoms syndrome: Secondary | ICD-10-CM | POA: Diagnosis not present

## 2022-03-05 DIAGNOSIS — E785 Hyperlipidemia, unspecified: Secondary | ICD-10-CM | POA: Diagnosis not present

## 2022-03-05 DIAGNOSIS — E119 Type 2 diabetes mellitus without complications: Secondary | ICD-10-CM | POA: Diagnosis not present

## 2022-03-05 DIAGNOSIS — E871 Hypo-osmolality and hyponatremia: Secondary | ICD-10-CM | POA: Diagnosis not present

## 2022-03-05 DIAGNOSIS — Z720 Tobacco use: Secondary | ICD-10-CM | POA: Diagnosis not present

## 2022-03-05 DIAGNOSIS — G8929 Other chronic pain: Secondary | ICD-10-CM | POA: Diagnosis not present

## 2022-03-05 DIAGNOSIS — T50905A Adverse effect of unspecified drugs, medicaments and biological substances, initial encounter: Secondary | ICD-10-CM | POA: Diagnosis not present

## 2022-03-05 DIAGNOSIS — G928 Other toxic encephalopathy: Secondary | ICD-10-CM | POA: Diagnosis not present

## 2022-03-05 DIAGNOSIS — M6281 Muscle weakness (generalized): Secondary | ICD-10-CM | POA: Diagnosis not present

## 2022-03-05 DIAGNOSIS — Z8673 Personal history of transient ischemic attack (TIA), and cerebral infarction without residual deficits: Secondary | ICD-10-CM | POA: Diagnosis not present

## 2022-03-06 DIAGNOSIS — G9341 Metabolic encephalopathy: Secondary | ICD-10-CM | POA: Diagnosis not present

## 2022-03-06 DIAGNOSIS — E1149 Type 2 diabetes mellitus with other diabetic neurological complication: Secondary | ICD-10-CM | POA: Diagnosis not present

## 2022-03-06 DIAGNOSIS — J449 Chronic obstructive pulmonary disease, unspecified: Secondary | ICD-10-CM | POA: Diagnosis not present

## 2022-03-06 DIAGNOSIS — Z720 Tobacco use: Secondary | ICD-10-CM | POA: Diagnosis not present

## 2022-03-06 DIAGNOSIS — D7212 Drug rash with eosinophilia and systemic symptoms syndrome: Secondary | ICD-10-CM | POA: Diagnosis not present

## 2022-03-06 DIAGNOSIS — D72829 Elevated white blood cell count, unspecified: Secondary | ICD-10-CM | POA: Diagnosis not present

## 2022-03-06 DIAGNOSIS — I2510 Atherosclerotic heart disease of native coronary artery without angina pectoris: Secondary | ICD-10-CM | POA: Diagnosis not present

## 2022-03-06 DIAGNOSIS — I10 Essential (primary) hypertension: Secondary | ICD-10-CM | POA: Diagnosis not present

## 2022-03-06 DIAGNOSIS — E871 Hypo-osmolality and hyponatremia: Secondary | ICD-10-CM | POA: Diagnosis not present

## 2022-03-06 DIAGNOSIS — R569 Unspecified convulsions: Secondary | ICD-10-CM | POA: Diagnosis not present

## 2022-03-06 DIAGNOSIS — T50905A Adverse effect of unspecified drugs, medicaments and biological substances, initial encounter: Secondary | ICD-10-CM | POA: Diagnosis not present

## 2022-03-06 DIAGNOSIS — E785 Hyperlipidemia, unspecified: Secondary | ICD-10-CM | POA: Diagnosis not present

## 2022-03-09 ENCOUNTER — Ambulatory Visit: Payer: Medicare Other | Admitting: Cardiology

## 2022-03-10 DIAGNOSIS — J9611 Chronic respiratory failure with hypoxia: Secondary | ICD-10-CM | POA: Diagnosis not present

## 2022-03-10 DIAGNOSIS — R262 Difficulty in walking, not elsewhere classified: Secondary | ICD-10-CM | POA: Diagnosis not present

## 2022-03-10 DIAGNOSIS — E119 Type 2 diabetes mellitus without complications: Secondary | ICD-10-CM | POA: Diagnosis not present

## 2022-03-10 DIAGNOSIS — G9341 Metabolic encephalopathy: Secondary | ICD-10-CM | POA: Diagnosis not present

## 2022-03-12 DIAGNOSIS — J449 Chronic obstructive pulmonary disease, unspecified: Secondary | ICD-10-CM | POA: Diagnosis not present

## 2022-03-12 DIAGNOSIS — G9341 Metabolic encephalopathy: Secondary | ICD-10-CM | POA: Diagnosis not present

## 2022-03-12 DIAGNOSIS — R262 Difficulty in walking, not elsewhere classified: Secondary | ICD-10-CM | POA: Diagnosis not present

## 2022-03-12 DIAGNOSIS — I2510 Atherosclerotic heart disease of native coronary artery without angina pectoris: Secondary | ICD-10-CM | POA: Diagnosis not present

## 2022-03-12 DIAGNOSIS — I10 Essential (primary) hypertension: Secondary | ICD-10-CM | POA: Diagnosis not present

## 2022-03-12 DIAGNOSIS — E119 Type 2 diabetes mellitus without complications: Secondary | ICD-10-CM | POA: Diagnosis not present

## 2022-03-12 DIAGNOSIS — Z720 Tobacco use: Secondary | ICD-10-CM | POA: Diagnosis not present

## 2022-03-12 DIAGNOSIS — E785 Hyperlipidemia, unspecified: Secondary | ICD-10-CM | POA: Diagnosis not present

## 2022-03-12 DIAGNOSIS — J9611 Chronic respiratory failure with hypoxia: Secondary | ICD-10-CM | POA: Diagnosis not present

## 2022-03-12 DIAGNOSIS — R911 Solitary pulmonary nodule: Secondary | ICD-10-CM | POA: Diagnosis not present

## 2022-03-12 DIAGNOSIS — R569 Unspecified convulsions: Secondary | ICD-10-CM | POA: Diagnosis not present

## 2022-03-12 DIAGNOSIS — E1149 Type 2 diabetes mellitus with other diabetic neurological complication: Secondary | ICD-10-CM | POA: Diagnosis not present

## 2022-03-12 DIAGNOSIS — D7212 Drug rash with eosinophilia and systemic symptoms syndrome: Secondary | ICD-10-CM | POA: Diagnosis not present

## 2022-03-19 DIAGNOSIS — Z7984 Long term (current) use of oral hypoglycemic drugs: Secondary | ICD-10-CM | POA: Diagnosis not present

## 2022-03-19 DIAGNOSIS — J9611 Chronic respiratory failure with hypoxia: Secondary | ICD-10-CM | POA: Diagnosis not present

## 2022-03-19 DIAGNOSIS — Z7902 Long term (current) use of antithrombotics/antiplatelets: Secondary | ICD-10-CM | POA: Diagnosis not present

## 2022-03-19 DIAGNOSIS — I10 Essential (primary) hypertension: Secondary | ICD-10-CM | POA: Diagnosis not present

## 2022-03-19 DIAGNOSIS — R911 Solitary pulmonary nodule: Secondary | ICD-10-CM | POA: Diagnosis not present

## 2022-03-19 DIAGNOSIS — F32A Depression, unspecified: Secondary | ICD-10-CM | POA: Diagnosis not present

## 2022-03-19 DIAGNOSIS — T50905A Adverse effect of unspecified drugs, medicaments and biological substances, initial encounter: Secondary | ICD-10-CM | POA: Diagnosis not present

## 2022-03-19 DIAGNOSIS — I2510 Atherosclerotic heart disease of native coronary artery without angina pectoris: Secondary | ICD-10-CM | POA: Diagnosis not present

## 2022-03-19 DIAGNOSIS — K219 Gastro-esophageal reflux disease without esophagitis: Secondary | ICD-10-CM | POA: Diagnosis not present

## 2022-03-19 DIAGNOSIS — Z981 Arthrodesis status: Secondary | ICD-10-CM | POA: Diagnosis not present

## 2022-03-19 DIAGNOSIS — J449 Chronic obstructive pulmonary disease, unspecified: Secondary | ICD-10-CM | POA: Diagnosis not present

## 2022-03-19 DIAGNOSIS — G8929 Other chronic pain: Secondary | ICD-10-CM | POA: Diagnosis not present

## 2022-03-19 DIAGNOSIS — Z9181 History of falling: Secondary | ICD-10-CM | POA: Diagnosis not present

## 2022-03-19 DIAGNOSIS — Z7982 Long term (current) use of aspirin: Secondary | ICD-10-CM | POA: Diagnosis not present

## 2022-03-19 DIAGNOSIS — R590 Localized enlarged lymph nodes: Secondary | ICD-10-CM | POA: Diagnosis not present

## 2022-03-19 DIAGNOSIS — E1151 Type 2 diabetes mellitus with diabetic peripheral angiopathy without gangrene: Secondary | ICD-10-CM | POA: Diagnosis not present

## 2022-03-19 DIAGNOSIS — E1149 Type 2 diabetes mellitus with other diabetic neurological complication: Secondary | ICD-10-CM | POA: Diagnosis not present

## 2022-03-19 DIAGNOSIS — F17210 Nicotine dependence, cigarettes, uncomplicated: Secondary | ICD-10-CM | POA: Diagnosis not present

## 2022-03-19 DIAGNOSIS — G9341 Metabolic encephalopathy: Secondary | ICD-10-CM | POA: Diagnosis not present

## 2022-03-19 DIAGNOSIS — D7212 Drug rash with eosinophilia and systemic symptoms syndrome: Secondary | ICD-10-CM | POA: Diagnosis not present

## 2022-03-24 DIAGNOSIS — Z79899 Other long term (current) drug therapy: Secondary | ICD-10-CM | POA: Diagnosis not present

## 2022-03-24 DIAGNOSIS — E871 Hypo-osmolality and hyponatremia: Secondary | ICD-10-CM | POA: Diagnosis not present

## 2022-03-24 DIAGNOSIS — G8929 Other chronic pain: Secondary | ICD-10-CM | POA: Diagnosis not present

## 2022-03-24 DIAGNOSIS — Z09 Encounter for follow-up examination after completed treatment for conditions other than malignant neoplasm: Secondary | ICD-10-CM | POA: Diagnosis not present

## 2022-03-24 DIAGNOSIS — Z8669 Personal history of other diseases of the nervous system and sense organs: Secondary | ICD-10-CM | POA: Diagnosis not present

## 2022-03-24 DIAGNOSIS — Z8661 Personal history of infections of the central nervous system: Secondary | ICD-10-CM | POA: Diagnosis not present

## 2022-03-26 DIAGNOSIS — D7212 Drug rash with eosinophilia and systemic symptoms syndrome: Secondary | ICD-10-CM | POA: Diagnosis not present

## 2022-03-26 DIAGNOSIS — I10 Essential (primary) hypertension: Secondary | ICD-10-CM | POA: Diagnosis not present

## 2022-03-26 DIAGNOSIS — J449 Chronic obstructive pulmonary disease, unspecified: Secondary | ICD-10-CM | POA: Diagnosis not present

## 2022-03-26 DIAGNOSIS — R911 Solitary pulmonary nodule: Secondary | ICD-10-CM | POA: Diagnosis not present

## 2022-03-26 DIAGNOSIS — T50905A Adverse effect of unspecified drugs, medicaments and biological substances, initial encounter: Secondary | ICD-10-CM | POA: Diagnosis not present

## 2022-03-26 DIAGNOSIS — F17210 Nicotine dependence, cigarettes, uncomplicated: Secondary | ICD-10-CM | POA: Diagnosis not present

## 2022-03-26 DIAGNOSIS — E1151 Type 2 diabetes mellitus with diabetic peripheral angiopathy without gangrene: Secondary | ICD-10-CM | POA: Diagnosis not present

## 2022-03-26 DIAGNOSIS — Z981 Arthrodesis status: Secondary | ICD-10-CM | POA: Diagnosis not present

## 2022-03-26 DIAGNOSIS — K219 Gastro-esophageal reflux disease without esophagitis: Secondary | ICD-10-CM | POA: Diagnosis not present

## 2022-03-26 DIAGNOSIS — Z9181 History of falling: Secondary | ICD-10-CM | POA: Diagnosis not present

## 2022-03-26 DIAGNOSIS — Z7902 Long term (current) use of antithrombotics/antiplatelets: Secondary | ICD-10-CM | POA: Diagnosis not present

## 2022-03-26 DIAGNOSIS — I2510 Atherosclerotic heart disease of native coronary artery without angina pectoris: Secondary | ICD-10-CM | POA: Diagnosis not present

## 2022-03-26 DIAGNOSIS — G9341 Metabolic encephalopathy: Secondary | ICD-10-CM | POA: Diagnosis not present

## 2022-03-26 DIAGNOSIS — Z7982 Long term (current) use of aspirin: Secondary | ICD-10-CM | POA: Diagnosis not present

## 2022-03-26 DIAGNOSIS — J9611 Chronic respiratory failure with hypoxia: Secondary | ICD-10-CM | POA: Diagnosis not present

## 2022-03-26 DIAGNOSIS — F32A Depression, unspecified: Secondary | ICD-10-CM | POA: Diagnosis not present

## 2022-03-26 DIAGNOSIS — E1149 Type 2 diabetes mellitus with other diabetic neurological complication: Secondary | ICD-10-CM | POA: Diagnosis not present

## 2022-03-26 DIAGNOSIS — G8929 Other chronic pain: Secondary | ICD-10-CM | POA: Diagnosis not present

## 2022-03-26 DIAGNOSIS — Z7984 Long term (current) use of oral hypoglycemic drugs: Secondary | ICD-10-CM | POA: Diagnosis not present

## 2022-03-30 DIAGNOSIS — M542 Cervicalgia: Secondary | ICD-10-CM | POA: Diagnosis not present

## 2022-03-30 DIAGNOSIS — M545 Low back pain, unspecified: Secondary | ICD-10-CM | POA: Diagnosis not present

## 2022-03-30 DIAGNOSIS — G894 Chronic pain syndrome: Secondary | ICD-10-CM | POA: Diagnosis not present

## 2022-03-31 DIAGNOSIS — I10 Essential (primary) hypertension: Secondary | ICD-10-CM | POA: Diagnosis not present

## 2022-03-31 DIAGNOSIS — E1149 Type 2 diabetes mellitus with other diabetic neurological complication: Secondary | ICD-10-CM | POA: Diagnosis not present

## 2022-03-31 DIAGNOSIS — Z981 Arthrodesis status: Secondary | ICD-10-CM | POA: Diagnosis not present

## 2022-03-31 DIAGNOSIS — G9341 Metabolic encephalopathy: Secondary | ICD-10-CM | POA: Diagnosis not present

## 2022-03-31 DIAGNOSIS — Z9181 History of falling: Secondary | ICD-10-CM | POA: Diagnosis not present

## 2022-03-31 DIAGNOSIS — K219 Gastro-esophageal reflux disease without esophagitis: Secondary | ICD-10-CM | POA: Diagnosis not present

## 2022-03-31 DIAGNOSIS — Z7982 Long term (current) use of aspirin: Secondary | ICD-10-CM | POA: Diagnosis not present

## 2022-03-31 DIAGNOSIS — G8929 Other chronic pain: Secondary | ICD-10-CM | POA: Diagnosis not present

## 2022-03-31 DIAGNOSIS — J9611 Chronic respiratory failure with hypoxia: Secondary | ICD-10-CM | POA: Diagnosis not present

## 2022-03-31 DIAGNOSIS — I2510 Atherosclerotic heart disease of native coronary artery without angina pectoris: Secondary | ICD-10-CM | POA: Diagnosis not present

## 2022-03-31 DIAGNOSIS — F32A Depression, unspecified: Secondary | ICD-10-CM | POA: Diagnosis not present

## 2022-03-31 DIAGNOSIS — Z7902 Long term (current) use of antithrombotics/antiplatelets: Secondary | ICD-10-CM | POA: Diagnosis not present

## 2022-03-31 DIAGNOSIS — T50905A Adverse effect of unspecified drugs, medicaments and biological substances, initial encounter: Secondary | ICD-10-CM | POA: Diagnosis not present

## 2022-03-31 DIAGNOSIS — F17210 Nicotine dependence, cigarettes, uncomplicated: Secondary | ICD-10-CM | POA: Diagnosis not present

## 2022-03-31 DIAGNOSIS — R911 Solitary pulmonary nodule: Secondary | ICD-10-CM | POA: Diagnosis not present

## 2022-03-31 DIAGNOSIS — E1151 Type 2 diabetes mellitus with diabetic peripheral angiopathy without gangrene: Secondary | ICD-10-CM | POA: Diagnosis not present

## 2022-03-31 DIAGNOSIS — D7212 Drug rash with eosinophilia and systemic symptoms syndrome: Secondary | ICD-10-CM | POA: Diagnosis not present

## 2022-03-31 DIAGNOSIS — J449 Chronic obstructive pulmonary disease, unspecified: Secondary | ICD-10-CM | POA: Diagnosis not present

## 2022-03-31 DIAGNOSIS — Z7984 Long term (current) use of oral hypoglycemic drugs: Secondary | ICD-10-CM | POA: Diagnosis not present

## 2022-04-01 DIAGNOSIS — R569 Unspecified convulsions: Secondary | ICD-10-CM | POA: Diagnosis not present

## 2022-04-02 DIAGNOSIS — E119 Type 2 diabetes mellitus without complications: Secondary | ICD-10-CM | POA: Diagnosis not present

## 2022-04-02 DIAGNOSIS — H5203 Hypermetropia, bilateral: Secondary | ICD-10-CM | POA: Diagnosis not present

## 2022-04-02 DIAGNOSIS — F17210 Nicotine dependence, cigarettes, uncomplicated: Secondary | ICD-10-CM | POA: Diagnosis not present

## 2022-04-02 DIAGNOSIS — R911 Solitary pulmonary nodule: Secondary | ICD-10-CM | POA: Diagnosis not present

## 2022-04-04 DIAGNOSIS — J449 Chronic obstructive pulmonary disease, unspecified: Secondary | ICD-10-CM | POA: Diagnosis not present

## 2022-04-06 DIAGNOSIS — R911 Solitary pulmonary nodule: Secondary | ICD-10-CM

## 2022-04-06 HISTORY — DX: Solitary pulmonary nodule: R91.1

## 2022-04-07 DIAGNOSIS — I10 Essential (primary) hypertension: Secondary | ICD-10-CM | POA: Diagnosis not present

## 2022-04-07 DIAGNOSIS — G9341 Metabolic encephalopathy: Secondary | ICD-10-CM | POA: Diagnosis not present

## 2022-04-07 DIAGNOSIS — E1151 Type 2 diabetes mellitus with diabetic peripheral angiopathy without gangrene: Secondary | ICD-10-CM | POA: Diagnosis not present

## 2022-04-07 DIAGNOSIS — K219 Gastro-esophageal reflux disease without esophagitis: Secondary | ICD-10-CM | POA: Diagnosis not present

## 2022-04-07 DIAGNOSIS — D7212 Drug rash with eosinophilia and systemic symptoms syndrome: Secondary | ICD-10-CM | POA: Diagnosis not present

## 2022-04-07 DIAGNOSIS — G8929 Other chronic pain: Secondary | ICD-10-CM | POA: Diagnosis not present

## 2022-04-07 DIAGNOSIS — Z981 Arthrodesis status: Secondary | ICD-10-CM | POA: Diagnosis not present

## 2022-04-07 DIAGNOSIS — Z79899 Other long term (current) drug therapy: Secondary | ICD-10-CM | POA: Diagnosis not present

## 2022-04-07 DIAGNOSIS — Z7982 Long term (current) use of aspirin: Secondary | ICD-10-CM | POA: Diagnosis not present

## 2022-04-07 DIAGNOSIS — Z7902 Long term (current) use of antithrombotics/antiplatelets: Secondary | ICD-10-CM | POA: Diagnosis not present

## 2022-04-07 DIAGNOSIS — I2510 Atherosclerotic heart disease of native coronary artery without angina pectoris: Secondary | ICD-10-CM | POA: Diagnosis not present

## 2022-04-07 DIAGNOSIS — G43909 Migraine, unspecified, not intractable, without status migrainosus: Secondary | ICD-10-CM | POA: Diagnosis not present

## 2022-04-07 DIAGNOSIS — J449 Chronic obstructive pulmonary disease, unspecified: Secondary | ICD-10-CM | POA: Diagnosis not present

## 2022-04-07 DIAGNOSIS — E1149 Type 2 diabetes mellitus with other diabetic neurological complication: Secondary | ICD-10-CM | POA: Diagnosis not present

## 2022-04-07 DIAGNOSIS — Z9181 History of falling: Secondary | ICD-10-CM | POA: Diagnosis not present

## 2022-04-07 DIAGNOSIS — Z712 Person consulting for explanation of examination or test findings: Secondary | ICD-10-CM | POA: Diagnosis not present

## 2022-04-07 DIAGNOSIS — R569 Unspecified convulsions: Secondary | ICD-10-CM | POA: Diagnosis not present

## 2022-04-07 DIAGNOSIS — T50905A Adverse effect of unspecified drugs, medicaments and biological substances, initial encounter: Secondary | ICD-10-CM | POA: Diagnosis not present

## 2022-04-07 DIAGNOSIS — R911 Solitary pulmonary nodule: Secondary | ICD-10-CM | POA: Diagnosis not present

## 2022-04-07 DIAGNOSIS — Z7984 Long term (current) use of oral hypoglycemic drugs: Secondary | ICD-10-CM | POA: Diagnosis not present

## 2022-04-07 DIAGNOSIS — F17210 Nicotine dependence, cigarettes, uncomplicated: Secondary | ICD-10-CM | POA: Diagnosis not present

## 2022-04-07 DIAGNOSIS — J9611 Chronic respiratory failure with hypoxia: Secondary | ICD-10-CM | POA: Diagnosis not present

## 2022-04-07 DIAGNOSIS — F32A Depression, unspecified: Secondary | ICD-10-CM | POA: Diagnosis not present

## 2022-04-07 DIAGNOSIS — G47419 Narcolepsy without cataplexy: Secondary | ICD-10-CM | POA: Diagnosis not present

## 2022-04-15 DIAGNOSIS — Z7902 Long term (current) use of antithrombotics/antiplatelets: Secondary | ICD-10-CM | POA: Diagnosis not present

## 2022-04-15 DIAGNOSIS — G9341 Metabolic encephalopathy: Secondary | ICD-10-CM | POA: Diagnosis not present

## 2022-04-15 DIAGNOSIS — J449 Chronic obstructive pulmonary disease, unspecified: Secondary | ICD-10-CM | POA: Diagnosis not present

## 2022-04-15 DIAGNOSIS — T50905A Adverse effect of unspecified drugs, medicaments and biological substances, initial encounter: Secondary | ICD-10-CM | POA: Diagnosis not present

## 2022-04-15 DIAGNOSIS — K219 Gastro-esophageal reflux disease without esophagitis: Secondary | ICD-10-CM | POA: Diagnosis not present

## 2022-04-15 DIAGNOSIS — Z981 Arthrodesis status: Secondary | ICD-10-CM | POA: Diagnosis not present

## 2022-04-15 DIAGNOSIS — D7212 Drug rash with eosinophilia and systemic symptoms syndrome: Secondary | ICD-10-CM | POA: Diagnosis not present

## 2022-04-15 DIAGNOSIS — R911 Solitary pulmonary nodule: Secondary | ICD-10-CM | POA: Diagnosis not present

## 2022-04-15 DIAGNOSIS — I2510 Atherosclerotic heart disease of native coronary artery without angina pectoris: Secondary | ICD-10-CM | POA: Diagnosis not present

## 2022-04-15 DIAGNOSIS — Z7984 Long term (current) use of oral hypoglycemic drugs: Secondary | ICD-10-CM | POA: Diagnosis not present

## 2022-04-15 DIAGNOSIS — Z7982 Long term (current) use of aspirin: Secondary | ICD-10-CM | POA: Diagnosis not present

## 2022-04-15 DIAGNOSIS — I10 Essential (primary) hypertension: Secondary | ICD-10-CM | POA: Diagnosis not present

## 2022-04-15 DIAGNOSIS — E1149 Type 2 diabetes mellitus with other diabetic neurological complication: Secondary | ICD-10-CM | POA: Diagnosis not present

## 2022-04-15 DIAGNOSIS — J9611 Chronic respiratory failure with hypoxia: Secondary | ICD-10-CM | POA: Diagnosis not present

## 2022-04-15 DIAGNOSIS — E1151 Type 2 diabetes mellitus with diabetic peripheral angiopathy without gangrene: Secondary | ICD-10-CM | POA: Diagnosis not present

## 2022-04-15 DIAGNOSIS — G8929 Other chronic pain: Secondary | ICD-10-CM | POA: Diagnosis not present

## 2022-04-15 DIAGNOSIS — Z9181 History of falling: Secondary | ICD-10-CM | POA: Diagnosis not present

## 2022-04-15 DIAGNOSIS — F17210 Nicotine dependence, cigarettes, uncomplicated: Secondary | ICD-10-CM | POA: Diagnosis not present

## 2022-04-15 DIAGNOSIS — F32A Depression, unspecified: Secondary | ICD-10-CM | POA: Diagnosis not present

## 2022-04-20 DIAGNOSIS — I2510 Atherosclerotic heart disease of native coronary artery without angina pectoris: Secondary | ICD-10-CM | POA: Diagnosis not present

## 2022-04-20 DIAGNOSIS — R911 Solitary pulmonary nodule: Secondary | ICD-10-CM | POA: Diagnosis not present

## 2022-04-20 DIAGNOSIS — I700 Atherosclerosis of aorta: Secondary | ICD-10-CM | POA: Diagnosis not present

## 2022-04-21 DIAGNOSIS — R911 Solitary pulmonary nodule: Secondary | ICD-10-CM | POA: Diagnosis not present

## 2022-04-21 DIAGNOSIS — F17210 Nicotine dependence, cigarettes, uncomplicated: Secondary | ICD-10-CM | POA: Diagnosis not present

## 2022-04-21 DIAGNOSIS — C3411 Malignant neoplasm of upper lobe, right bronchus or lung: Secondary | ICD-10-CM | POA: Insufficient documentation

## 2022-04-21 HISTORY — DX: Malignant neoplasm of upper lobe, right bronchus or lung: C34.11

## 2022-04-22 DIAGNOSIS — G9341 Metabolic encephalopathy: Secondary | ICD-10-CM | POA: Diagnosis not present

## 2022-04-22 DIAGNOSIS — Z9181 History of falling: Secondary | ICD-10-CM | POA: Diagnosis not present

## 2022-04-22 DIAGNOSIS — K219 Gastro-esophageal reflux disease without esophagitis: Secondary | ICD-10-CM | POA: Diagnosis not present

## 2022-04-22 DIAGNOSIS — D7212 Drug rash with eosinophilia and systemic symptoms syndrome: Secondary | ICD-10-CM | POA: Diagnosis not present

## 2022-04-22 DIAGNOSIS — J449 Chronic obstructive pulmonary disease, unspecified: Secondary | ICD-10-CM | POA: Diagnosis not present

## 2022-04-22 DIAGNOSIS — Z7902 Long term (current) use of antithrombotics/antiplatelets: Secondary | ICD-10-CM | POA: Diagnosis not present

## 2022-04-22 DIAGNOSIS — J9611 Chronic respiratory failure with hypoxia: Secondary | ICD-10-CM | POA: Diagnosis not present

## 2022-04-22 DIAGNOSIS — I2510 Atherosclerotic heart disease of native coronary artery without angina pectoris: Secondary | ICD-10-CM | POA: Diagnosis not present

## 2022-04-22 DIAGNOSIS — Z7982 Long term (current) use of aspirin: Secondary | ICD-10-CM | POA: Diagnosis not present

## 2022-04-22 DIAGNOSIS — E1151 Type 2 diabetes mellitus with diabetic peripheral angiopathy without gangrene: Secondary | ICD-10-CM | POA: Diagnosis not present

## 2022-04-22 DIAGNOSIS — Z981 Arthrodesis status: Secondary | ICD-10-CM | POA: Diagnosis not present

## 2022-04-22 DIAGNOSIS — E1149 Type 2 diabetes mellitus with other diabetic neurological complication: Secondary | ICD-10-CM | POA: Diagnosis not present

## 2022-04-22 DIAGNOSIS — Z7984 Long term (current) use of oral hypoglycemic drugs: Secondary | ICD-10-CM | POA: Diagnosis not present

## 2022-04-22 DIAGNOSIS — F32A Depression, unspecified: Secondary | ICD-10-CM | POA: Diagnosis not present

## 2022-04-22 DIAGNOSIS — F17210 Nicotine dependence, cigarettes, uncomplicated: Secondary | ICD-10-CM | POA: Diagnosis not present

## 2022-04-22 DIAGNOSIS — I10 Essential (primary) hypertension: Secondary | ICD-10-CM | POA: Diagnosis not present

## 2022-04-22 DIAGNOSIS — T50905A Adverse effect of unspecified drugs, medicaments and biological substances, initial encounter: Secondary | ICD-10-CM | POA: Diagnosis not present

## 2022-04-22 DIAGNOSIS — G8929 Other chronic pain: Secondary | ICD-10-CM | POA: Diagnosis not present

## 2022-04-22 DIAGNOSIS — R911 Solitary pulmonary nodule: Secondary | ICD-10-CM | POA: Diagnosis not present

## 2022-04-28 DIAGNOSIS — F32A Depression, unspecified: Secondary | ICD-10-CM | POA: Diagnosis not present

## 2022-04-28 DIAGNOSIS — R911 Solitary pulmonary nodule: Secondary | ICD-10-CM | POA: Diagnosis not present

## 2022-04-28 DIAGNOSIS — I2510 Atherosclerotic heart disease of native coronary artery without angina pectoris: Secondary | ICD-10-CM | POA: Diagnosis not present

## 2022-04-28 DIAGNOSIS — E1149 Type 2 diabetes mellitus with other diabetic neurological complication: Secondary | ICD-10-CM | POA: Diagnosis not present

## 2022-04-28 DIAGNOSIS — I10 Essential (primary) hypertension: Secondary | ICD-10-CM | POA: Diagnosis not present

## 2022-04-28 DIAGNOSIS — Z981 Arthrodesis status: Secondary | ICD-10-CM | POA: Diagnosis not present

## 2022-04-28 DIAGNOSIS — Z7902 Long term (current) use of antithrombotics/antiplatelets: Secondary | ICD-10-CM | POA: Diagnosis not present

## 2022-04-28 DIAGNOSIS — Z9181 History of falling: Secondary | ICD-10-CM | POA: Diagnosis not present

## 2022-04-28 DIAGNOSIS — Z7982 Long term (current) use of aspirin: Secondary | ICD-10-CM | POA: Diagnosis not present

## 2022-04-28 DIAGNOSIS — Z7984 Long term (current) use of oral hypoglycemic drugs: Secondary | ICD-10-CM | POA: Diagnosis not present

## 2022-04-28 DIAGNOSIS — T50905A Adverse effect of unspecified drugs, medicaments and biological substances, initial encounter: Secondary | ICD-10-CM | POA: Diagnosis not present

## 2022-04-28 DIAGNOSIS — G9341 Metabolic encephalopathy: Secondary | ICD-10-CM | POA: Diagnosis not present

## 2022-04-28 DIAGNOSIS — G8929 Other chronic pain: Secondary | ICD-10-CM | POA: Diagnosis not present

## 2022-04-28 DIAGNOSIS — F17210 Nicotine dependence, cigarettes, uncomplicated: Secondary | ICD-10-CM | POA: Diagnosis not present

## 2022-04-28 DIAGNOSIS — K219 Gastro-esophageal reflux disease without esophagitis: Secondary | ICD-10-CM | POA: Diagnosis not present

## 2022-04-28 DIAGNOSIS — J9611 Chronic respiratory failure with hypoxia: Secondary | ICD-10-CM | POA: Diagnosis not present

## 2022-04-28 DIAGNOSIS — E1151 Type 2 diabetes mellitus with diabetic peripheral angiopathy without gangrene: Secondary | ICD-10-CM | POA: Diagnosis not present

## 2022-04-28 DIAGNOSIS — D7212 Drug rash with eosinophilia and systemic symptoms syndrome: Secondary | ICD-10-CM | POA: Diagnosis not present

## 2022-04-28 DIAGNOSIS — J449 Chronic obstructive pulmonary disease, unspecified: Secondary | ICD-10-CM | POA: Diagnosis not present

## 2022-04-29 DIAGNOSIS — E1151 Type 2 diabetes mellitus with diabetic peripheral angiopathy without gangrene: Secondary | ICD-10-CM | POA: Diagnosis not present

## 2022-04-29 DIAGNOSIS — Z7982 Long term (current) use of aspirin: Secondary | ICD-10-CM | POA: Diagnosis not present

## 2022-04-29 DIAGNOSIS — G9341 Metabolic encephalopathy: Secondary | ICD-10-CM | POA: Diagnosis not present

## 2022-04-29 DIAGNOSIS — Z981 Arthrodesis status: Secondary | ICD-10-CM | POA: Diagnosis not present

## 2022-04-29 DIAGNOSIS — I10 Essential (primary) hypertension: Secondary | ICD-10-CM | POA: Diagnosis not present

## 2022-04-29 DIAGNOSIS — R911 Solitary pulmonary nodule: Secondary | ICD-10-CM | POA: Diagnosis not present

## 2022-04-29 DIAGNOSIS — Z7902 Long term (current) use of antithrombotics/antiplatelets: Secondary | ICD-10-CM | POA: Diagnosis not present

## 2022-04-29 DIAGNOSIS — Z9181 History of falling: Secondary | ICD-10-CM | POA: Diagnosis not present

## 2022-04-29 DIAGNOSIS — D7212 Drug rash with eosinophilia and systemic symptoms syndrome: Secondary | ICD-10-CM | POA: Diagnosis not present

## 2022-04-29 DIAGNOSIS — J9611 Chronic respiratory failure with hypoxia: Secondary | ICD-10-CM | POA: Diagnosis not present

## 2022-04-29 DIAGNOSIS — E1149 Type 2 diabetes mellitus with other diabetic neurological complication: Secondary | ICD-10-CM | POA: Diagnosis not present

## 2022-04-29 DIAGNOSIS — G8929 Other chronic pain: Secondary | ICD-10-CM | POA: Diagnosis not present

## 2022-04-29 DIAGNOSIS — F17210 Nicotine dependence, cigarettes, uncomplicated: Secondary | ICD-10-CM | POA: Diagnosis not present

## 2022-04-29 DIAGNOSIS — T50905A Adverse effect of unspecified drugs, medicaments and biological substances, initial encounter: Secondary | ICD-10-CM | POA: Diagnosis not present

## 2022-04-29 DIAGNOSIS — K219 Gastro-esophageal reflux disease without esophagitis: Secondary | ICD-10-CM | POA: Diagnosis not present

## 2022-04-29 DIAGNOSIS — I2510 Atherosclerotic heart disease of native coronary artery without angina pectoris: Secondary | ICD-10-CM | POA: Diagnosis not present

## 2022-04-29 DIAGNOSIS — F32A Depression, unspecified: Secondary | ICD-10-CM | POA: Diagnosis not present

## 2022-04-29 DIAGNOSIS — J449 Chronic obstructive pulmonary disease, unspecified: Secondary | ICD-10-CM | POA: Diagnosis not present

## 2022-04-29 DIAGNOSIS — Z7984 Long term (current) use of oral hypoglycemic drugs: Secondary | ICD-10-CM | POA: Diagnosis not present

## 2022-05-05 DIAGNOSIS — J449 Chronic obstructive pulmonary disease, unspecified: Secondary | ICD-10-CM | POA: Diagnosis not present

## 2022-05-06 DIAGNOSIS — G8929 Other chronic pain: Secondary | ICD-10-CM | POA: Diagnosis not present

## 2022-05-06 DIAGNOSIS — E1149 Type 2 diabetes mellitus with other diabetic neurological complication: Secondary | ICD-10-CM | POA: Diagnosis not present

## 2022-05-06 DIAGNOSIS — J9611 Chronic respiratory failure with hypoxia: Secondary | ICD-10-CM | POA: Diagnosis not present

## 2022-05-06 DIAGNOSIS — J449 Chronic obstructive pulmonary disease, unspecified: Secondary | ICD-10-CM | POA: Diagnosis not present

## 2022-05-06 DIAGNOSIS — D7212 Drug rash with eosinophilia and systemic symptoms syndrome: Secondary | ICD-10-CM | POA: Diagnosis not present

## 2022-05-06 DIAGNOSIS — R911 Solitary pulmonary nodule: Secondary | ICD-10-CM | POA: Diagnosis not present

## 2022-05-06 DIAGNOSIS — F32A Depression, unspecified: Secondary | ICD-10-CM | POA: Diagnosis not present

## 2022-05-06 DIAGNOSIS — Z9181 History of falling: Secondary | ICD-10-CM | POA: Diagnosis not present

## 2022-05-06 DIAGNOSIS — E1151 Type 2 diabetes mellitus with diabetic peripheral angiopathy without gangrene: Secondary | ICD-10-CM | POA: Diagnosis not present

## 2022-05-06 DIAGNOSIS — I10 Essential (primary) hypertension: Secondary | ICD-10-CM | POA: Diagnosis not present

## 2022-05-06 DIAGNOSIS — Z7902 Long term (current) use of antithrombotics/antiplatelets: Secondary | ICD-10-CM | POA: Diagnosis not present

## 2022-05-06 DIAGNOSIS — Z7984 Long term (current) use of oral hypoglycemic drugs: Secondary | ICD-10-CM | POA: Diagnosis not present

## 2022-05-06 DIAGNOSIS — G9341 Metabolic encephalopathy: Secondary | ICD-10-CM | POA: Diagnosis not present

## 2022-05-06 DIAGNOSIS — Z981 Arthrodesis status: Secondary | ICD-10-CM | POA: Diagnosis not present

## 2022-05-06 DIAGNOSIS — I2510 Atherosclerotic heart disease of native coronary artery without angina pectoris: Secondary | ICD-10-CM | POA: Diagnosis not present

## 2022-05-06 DIAGNOSIS — Z7982 Long term (current) use of aspirin: Secondary | ICD-10-CM | POA: Diagnosis not present

## 2022-05-06 DIAGNOSIS — T50905A Adverse effect of unspecified drugs, medicaments and biological substances, initial encounter: Secondary | ICD-10-CM | POA: Diagnosis not present

## 2022-05-06 DIAGNOSIS — F17210 Nicotine dependence, cigarettes, uncomplicated: Secondary | ICD-10-CM | POA: Diagnosis not present

## 2022-05-06 DIAGNOSIS — K219 Gastro-esophageal reflux disease without esophagitis: Secondary | ICD-10-CM | POA: Diagnosis not present

## 2022-05-11 DIAGNOSIS — C3411 Malignant neoplasm of upper lobe, right bronchus or lung: Secondary | ICD-10-CM | POA: Diagnosis not present

## 2022-05-11 DIAGNOSIS — Z510 Encounter for antineoplastic radiation therapy: Secondary | ICD-10-CM | POA: Diagnosis not present

## 2022-05-11 DIAGNOSIS — M545 Low back pain, unspecified: Secondary | ICD-10-CM | POA: Diagnosis not present

## 2022-05-11 DIAGNOSIS — M542 Cervicalgia: Secondary | ICD-10-CM | POA: Diagnosis not present

## 2022-05-11 DIAGNOSIS — Z79899 Other long term (current) drug therapy: Secondary | ICD-10-CM | POA: Diagnosis not present

## 2022-05-11 DIAGNOSIS — Z79891 Long term (current) use of opiate analgesic: Secondary | ICD-10-CM | POA: Diagnosis not present

## 2022-05-11 DIAGNOSIS — G894 Chronic pain syndrome: Secondary | ICD-10-CM | POA: Diagnosis not present

## 2022-05-13 DIAGNOSIS — F32A Depression, unspecified: Secondary | ICD-10-CM | POA: Diagnosis not present

## 2022-05-13 DIAGNOSIS — D7212 Drug rash with eosinophilia and systemic symptoms syndrome: Secondary | ICD-10-CM | POA: Diagnosis not present

## 2022-05-13 DIAGNOSIS — Z981 Arthrodesis status: Secondary | ICD-10-CM | POA: Diagnosis not present

## 2022-05-13 DIAGNOSIS — Z7902 Long term (current) use of antithrombotics/antiplatelets: Secondary | ICD-10-CM | POA: Diagnosis not present

## 2022-05-13 DIAGNOSIS — Z7984 Long term (current) use of oral hypoglycemic drugs: Secondary | ICD-10-CM | POA: Diagnosis not present

## 2022-05-13 DIAGNOSIS — R911 Solitary pulmonary nodule: Secondary | ICD-10-CM | POA: Diagnosis not present

## 2022-05-13 DIAGNOSIS — J9611 Chronic respiratory failure with hypoxia: Secondary | ICD-10-CM | POA: Diagnosis not present

## 2022-05-13 DIAGNOSIS — Z9181 History of falling: Secondary | ICD-10-CM | POA: Diagnosis not present

## 2022-05-13 DIAGNOSIS — E1149 Type 2 diabetes mellitus with other diabetic neurological complication: Secondary | ICD-10-CM | POA: Diagnosis not present

## 2022-05-13 DIAGNOSIS — E1151 Type 2 diabetes mellitus with diabetic peripheral angiopathy without gangrene: Secondary | ICD-10-CM | POA: Diagnosis not present

## 2022-05-13 DIAGNOSIS — G8929 Other chronic pain: Secondary | ICD-10-CM | POA: Diagnosis not present

## 2022-05-13 DIAGNOSIS — K219 Gastro-esophageal reflux disease without esophagitis: Secondary | ICD-10-CM | POA: Diagnosis not present

## 2022-05-13 DIAGNOSIS — G9341 Metabolic encephalopathy: Secondary | ICD-10-CM | POA: Diagnosis not present

## 2022-05-13 DIAGNOSIS — F17210 Nicotine dependence, cigarettes, uncomplicated: Secondary | ICD-10-CM | POA: Diagnosis not present

## 2022-05-13 DIAGNOSIS — J449 Chronic obstructive pulmonary disease, unspecified: Secondary | ICD-10-CM | POA: Diagnosis not present

## 2022-05-13 DIAGNOSIS — Z7982 Long term (current) use of aspirin: Secondary | ICD-10-CM | POA: Diagnosis not present

## 2022-05-13 DIAGNOSIS — I2510 Atherosclerotic heart disease of native coronary artery without angina pectoris: Secondary | ICD-10-CM | POA: Diagnosis not present

## 2022-05-13 DIAGNOSIS — I10 Essential (primary) hypertension: Secondary | ICD-10-CM | POA: Diagnosis not present

## 2022-05-13 DIAGNOSIS — T50905A Adverse effect of unspecified drugs, medicaments and biological substances, initial encounter: Secondary | ICD-10-CM | POA: Diagnosis not present

## 2022-05-16 DIAGNOSIS — C3411 Malignant neoplasm of upper lobe, right bronchus or lung: Secondary | ICD-10-CM | POA: Diagnosis not present

## 2022-05-28 DIAGNOSIS — C3411 Malignant neoplasm of upper lobe, right bronchus or lung: Secondary | ICD-10-CM | POA: Diagnosis not present

## 2022-05-28 DIAGNOSIS — Z510 Encounter for antineoplastic radiation therapy: Secondary | ICD-10-CM | POA: Diagnosis not present

## 2022-06-01 ENCOUNTER — Ambulatory Visit: Payer: Self-pay | Admitting: Licensed Clinical Social Worker

## 2022-06-01 DIAGNOSIS — C3411 Malignant neoplasm of upper lobe, right bronchus or lung: Secondary | ICD-10-CM | POA: Diagnosis not present

## 2022-06-01 DIAGNOSIS — Z510 Encounter for antineoplastic radiation therapy: Secondary | ICD-10-CM | POA: Diagnosis not present

## 2022-06-01 NOTE — Patient Instructions (Signed)
Visit Information  Thank you for taking time to visit with me today. Please don't hesitate to contact me if I can be of assistance to you.   Following are the goals we discussed today:   Goals Addressed               This Visit's Progress     Care Coordination Activities- Radiation (pt-stated)        Patient is doing well and preparing for radiation. Daughter reports being proud of patient.  Care Coordination Interventions: Reviewed scheduled/upcoming provider appointments including Radiation 10/23 at 3:30pm  Active listening / Reflection utilized  Emotional Support Provided Caregiver stress acknowledged            Patient verbalizes understanding of instructions and care plan provided today and agrees to view in [place]. Active MyChart status and patient understanding of how to access instructions and care plan via MyChart confirmed with patient.     No further follow up required: .  Milus Height, Arita Miss, MSW, [place]  Social Worker [facility] Care Management  [phone]

## 2022-06-01 NOTE — Patient Outreach (Signed)
  Care Coordination   Initial Visit Note   06/01/2022 Name: Barbara French MRN: [id number] DOB: June 28, 1956  Barbara French is a 66 y.o. year old female who sees Nicholos Johns, MD for primary care. I spoke with  Barbara French by phone today.  What matters to the patients health and wellness today?  Care Coordination- Radiation    Goals Addressed               This Visit's Progress     Care Coordination Activities- Radiation (pt-stated)        Patient is doing well and preparing for radiation. Daughter reports being proud of patient.  Care Coordination Interventions: Reviewed scheduled/upcoming provider appointments including Radiation 10/23 at 3:30pm  Active listening / Reflection utilized  Emotional Support Provided Caregiver stress acknowledged          SDOH assessments and interventions completed:  Yes     Care Coordination Interventions Activated:  Yes  Care Coordination Interventions:  Yes, provided   Follow up plan: No further intervention required.   Encounter Outcome:  Pt. Visit Completed

## 2022-06-03 DIAGNOSIS — C3411 Malignant neoplasm of upper lobe, right bronchus or lung: Secondary | ICD-10-CM | POA: Diagnosis not present

## 2022-06-03 DIAGNOSIS — Z510 Encounter for antineoplastic radiation therapy: Secondary | ICD-10-CM | POA: Diagnosis not present

## 2022-06-04 DIAGNOSIS — J449 Chronic obstructive pulmonary disease, unspecified: Secondary | ICD-10-CM | POA: Diagnosis not present

## 2022-06-05 DIAGNOSIS — C3411 Malignant neoplasm of upper lobe, right bronchus or lung: Secondary | ICD-10-CM | POA: Diagnosis not present

## 2022-06-05 DIAGNOSIS — Z510 Encounter for antineoplastic radiation therapy: Secondary | ICD-10-CM | POA: Diagnosis not present

## 2022-06-08 DIAGNOSIS — C3411 Malignant neoplasm of upper lobe, right bronchus or lung: Secondary | ICD-10-CM | POA: Diagnosis not present

## 2022-06-08 DIAGNOSIS — Z510 Encounter for antineoplastic radiation therapy: Secondary | ICD-10-CM | POA: Diagnosis not present

## 2022-06-10 DIAGNOSIS — Z510 Encounter for antineoplastic radiation therapy: Secondary | ICD-10-CM | POA: Diagnosis not present

## 2022-06-10 DIAGNOSIS — C3411 Malignant neoplasm of upper lobe, right bronchus or lung: Secondary | ICD-10-CM | POA: Diagnosis not present

## 2022-06-22 DIAGNOSIS — M542 Cervicalgia: Secondary | ICD-10-CM | POA: Diagnosis not present

## 2022-06-22 DIAGNOSIS — G894 Chronic pain syndrome: Secondary | ICD-10-CM | POA: Diagnosis not present

## 2022-06-22 DIAGNOSIS — M545 Low back pain, unspecified: Secondary | ICD-10-CM | POA: Diagnosis not present

## 2022-07-05 DIAGNOSIS — J449 Chronic obstructive pulmonary disease, unspecified: Secondary | ICD-10-CM | POA: Diagnosis not present

## 2022-07-08 DIAGNOSIS — G43909 Migraine, unspecified, not intractable, without status migrainosus: Secondary | ICD-10-CM | POA: Diagnosis not present

## 2022-07-08 DIAGNOSIS — G43009 Migraine without aura, not intractable, without status migrainosus: Secondary | ICD-10-CM

## 2022-07-08 DIAGNOSIS — R569 Unspecified convulsions: Secondary | ICD-10-CM | POA: Diagnosis not present

## 2022-07-08 DIAGNOSIS — G47419 Narcolepsy without cataplexy: Secondary | ICD-10-CM | POA: Diagnosis not present

## 2022-07-08 DIAGNOSIS — Z79899 Other long term (current) drug therapy: Secondary | ICD-10-CM | POA: Diagnosis not present

## 2022-07-08 HISTORY — DX: Migraine without aura, not intractable, without status migrainosus: G43.009

## 2022-07-30 DIAGNOSIS — M542 Cervicalgia: Secondary | ICD-10-CM | POA: Diagnosis not present

## 2022-07-30 DIAGNOSIS — G894 Chronic pain syndrome: Secondary | ICD-10-CM | POA: Diagnosis not present

## 2022-07-30 DIAGNOSIS — M545 Low back pain, unspecified: Secondary | ICD-10-CM | POA: Diagnosis not present

## 2022-08-04 DIAGNOSIS — J449 Chronic obstructive pulmonary disease, unspecified: Secondary | ICD-10-CM | POA: Diagnosis not present

## 2022-08-31 DIAGNOSIS — M542 Cervicalgia: Secondary | ICD-10-CM | POA: Diagnosis not present

## 2022-08-31 DIAGNOSIS — G894 Chronic pain syndrome: Secondary | ICD-10-CM | POA: Diagnosis not present

## 2022-08-31 DIAGNOSIS — M545 Low back pain, unspecified: Secondary | ICD-10-CM | POA: Diagnosis not present

## 2022-09-04 DIAGNOSIS — J449 Chronic obstructive pulmonary disease, unspecified: Secondary | ICD-10-CM | POA: Diagnosis not present

## 2022-09-28 DIAGNOSIS — M542 Cervicalgia: Secondary | ICD-10-CM | POA: Diagnosis not present

## 2022-09-28 DIAGNOSIS — M545 Low back pain, unspecified: Secondary | ICD-10-CM | POA: Diagnosis not present

## 2022-09-28 DIAGNOSIS — G894 Chronic pain syndrome: Secondary | ICD-10-CM | POA: Diagnosis not present

## 2022-10-05 DIAGNOSIS — J449 Chronic obstructive pulmonary disease, unspecified: Secondary | ICD-10-CM | POA: Diagnosis not present

## 2022-10-20 ENCOUNTER — Telehealth: Payer: Self-pay | Admitting: Cardiology

## 2022-10-20 DIAGNOSIS — I10 Essential (primary) hypertension: Secondary | ICD-10-CM | POA: Diagnosis not present

## 2022-10-20 DIAGNOSIS — E785 Hyperlipidemia, unspecified: Secondary | ICD-10-CM | POA: Diagnosis not present

## 2022-10-20 DIAGNOSIS — G8929 Other chronic pain: Secondary | ICD-10-CM | POA: Diagnosis not present

## 2022-10-20 DIAGNOSIS — Z8673 Personal history of transient ischemic attack (TIA), and cerebral infarction without residual deficits: Secondary | ICD-10-CM | POA: Diagnosis not present

## 2022-10-20 NOTE — Telephone Encounter (Signed)
Left VM for pt to callback.   We have never saw the pt and cannot call in a refill.

## 2022-10-20 NOTE — Telephone Encounter (Signed)
*  STAT* If patient is at the pharmacy, call can be transferred to refill team.   1. Which medications need to be refilled? (please list name of each medication and dose if known)  clopidogrel (PLAVIX) 75 MG tablet  Take 75 mg by mouth daily.   2. Which pharmacy/location (including street and city if local pharmacy) is medication to be sent to? [facility] at [address], [place], [place] [address]  3. Do they need a 30 day or 90 day supply? 90 day supply  Pt is currently our of medication, pt scheduled an appt for 12/21/22

## 2022-10-21 ENCOUNTER — Telehealth: Payer: Self-pay

## 2022-10-21 NOTE — Telephone Encounter (Signed)
Left vm to call back

## 2022-10-21 NOTE — Patient Outreach (Signed)
  Care Coordination   Initial Visit Note   10/21/2022 Name: Barbara French MRN: [id number] DOB: Apr 09, 1956  Barbara French is a 67 y.o. year old female who sees Nicholos Johns, MD for primary care. I spoke with  Barbara French by phone today.  What matters to the patients health and wellness today?  Placed call to patient today to review and offer [facility] care coordination program.  Patient reports that she went to MD office yesterday for a scheduled follow up and MD was not present. Reports that she did a virtual visit and MD was supposed to call her back but did not. Patient reports that she is out of refills on her medications to include Metformin, Januvia, Plavix, Vitamin D, Crestor .  Patient reports no refills.  Reports medications are normally delivered and she can afford them.  Patient reports difficulty with transportation due to broken car.  Patient request I speak with her boyfriend,  [name].  [name] states they are working on trying to find means of transportation. I reviewed with patient and boyfriend that she needs to use her insurance benefit for transportation. Reviewed how to access this benefit.  Patient and boyfriend deny any other concerns but her medications.     Goals Addressed               This Visit's Progress     Out of medications and transportation concerns. (pt-stated)        Interventions Today    Flowsheet Row Most Recent Value  Chronic Disease   Chronic disease during today's visit Diabetes  General Interventions   General Interventions Discussed/Reviewed General Interventions Discussed, Doctor Visits, Durable Medical Equipment (DME)  Doctor Visits Discussed/Reviewed Doctor Visits Discussed  [PLaced call to MD office and left a message for Crystal Ward to call me back.]  Durable Medical Equipment (DME) Glucomoter  [reports lost meter]  [place] Discussed/Reviewed Other  [transportation. Reviewed transportation benefit with  her insurance. Provided education on how to access this benefit.]  Pharmacy Interventions   Pharmacy Dicussed/Reviewed Pharmacy Topics Discussed, Affording Medications  [reviewed with patient that she is out of her RX. Encouraged patient and boyfriend to call pharmacy [address] to request refills and pharmacy to send request to MD. Patient and boyfriend states they can afford medications.]              SDOH assessments and interventions completed:  No     Care Coordination Interventions:  Yes, provided   Follow up plan:  awaiting a call back from MD office    Encounter Outcome:  Pt. Visit Completed   Tomasa Rand, RN, BSN, CEN [place] Coordinator [phone]

## 2022-10-21 NOTE — Telephone Encounter (Signed)
Advised that we have not saw her since 2021 and cannot call in refills.  Pt verbalized understanding and had no additional questions.

## 2022-10-21 NOTE — Patient Outreach (Signed)
  Care Coordination   Follow Up Visit Note   10/21/2022 Name: Barbara French MRN: [id number] DOB: [birth year]  Barbara French is a 67 y.o. year old female who sees Nicholos Johns, MD for primary care. I spoke with  Barbara French by phone today. Also spoke with office manager, Barbara French.  What matters to the patients health and wellness today?  Spoke with office manager regarding patient being out of medications.  Glass blower/designer reports that patient has been non complaint with treatment plan and need for office visits. Patient was informed that she has to come in for labs within the next 2 days, to be able to get her RX refills. I reviewed this with patient.     Goals Addressed               This Visit's Progress     Out of medications and transportation concerns. (pt-stated)        Interventions Today    Flowsheet Row Most Recent Value  Chronic Disease   Chronic disease during today's visit Diabetes  General Interventions   Doctor Visits Discussed/Reviewed Doctor Visits Discussed  [Spoke with MD office and patient has to come in for labs within the next 2 days.  Patient was informed by MD on tele visit yesterday.  MD has agreed to refill her medications for 7 days to allow patient to come in and have labs drawn and resulted.]  Education Interventions   Education Provided Provided Education  [importance of labs, compliance, and importance of keeping up with health related concerns. Encouraged patient to call MD office for an appointment for labs today.]  Pharmacy Interventions   Pharmacy Dicussed/Reviewed Medication Adherence  [Patient was informed to call the office today and make an appointment.  Reviewed importance of testing for management of chronic medical problems]              SDOH assessments and interventions completed:  No     Care Coordination Interventions:  Yes, provided   Follow up plan: No further intervention required.   Encounter Outcome:  Pt. Visit  Completed   Tomasa Rand, RN, BSN, CEN [place] Coordinator [phone]

## 2022-10-22 DIAGNOSIS — E785 Hyperlipidemia, unspecified: Secondary | ICD-10-CM | POA: Diagnosis not present

## 2022-10-22 DIAGNOSIS — E559 Vitamin D deficiency, unspecified: Secondary | ICD-10-CM | POA: Diagnosis not present

## 2022-10-22 DIAGNOSIS — Z79899 Other long term (current) drug therapy: Secondary | ICD-10-CM | POA: Diagnosis not present

## 2022-10-22 DIAGNOSIS — E1151 Type 2 diabetes mellitus with diabetic peripheral angiopathy without gangrene: Secondary | ICD-10-CM | POA: Diagnosis not present

## 2022-10-26 DIAGNOSIS — S0990XA Unspecified injury of head, initial encounter: Secondary | ICD-10-CM | POA: Diagnosis not present

## 2022-10-26 DIAGNOSIS — E222 Syndrome of inappropriate secretion of antidiuretic hormone: Secondary | ICD-10-CM | POA: Diagnosis not present

## 2022-10-26 DIAGNOSIS — E7800 Pure hypercholesterolemia, unspecified: Secondary | ICD-10-CM | POA: Diagnosis not present

## 2022-10-26 DIAGNOSIS — E8809 Other disorders of plasma-protein metabolism, not elsewhere classified: Secondary | ICD-10-CM | POA: Diagnosis not present

## 2022-10-26 DIAGNOSIS — J441 Chronic obstructive pulmonary disease with (acute) exacerbation: Secondary | ICD-10-CM | POA: Diagnosis not present

## 2022-10-26 DIAGNOSIS — F17210 Nicotine dependence, cigarettes, uncomplicated: Secondary | ICD-10-CM | POA: Diagnosis not present

## 2022-10-26 DIAGNOSIS — Z743 Need for continuous supervision: Secondary | ICD-10-CM | POA: Diagnosis not present

## 2022-10-26 DIAGNOSIS — M1990 Unspecified osteoarthritis, unspecified site: Secondary | ICD-10-CM | POA: Diagnosis not present

## 2022-10-26 DIAGNOSIS — R6889 Other general symptoms and signs: Secondary | ICD-10-CM | POA: Diagnosis not present

## 2022-10-26 DIAGNOSIS — I4891 Unspecified atrial fibrillation: Secondary | ICD-10-CM | POA: Diagnosis not present

## 2022-10-26 DIAGNOSIS — G9341 Metabolic encephalopathy: Secondary | ICD-10-CM | POA: Diagnosis not present

## 2022-10-26 DIAGNOSIS — J9601 Acute respiratory failure with hypoxia: Secondary | ICD-10-CM | POA: Diagnosis not present

## 2022-10-26 DIAGNOSIS — C3490 Malignant neoplasm of unspecified part of unspecified bronchus or lung: Secondary | ICD-10-CM | POA: Diagnosis not present

## 2022-10-26 DIAGNOSIS — J9621 Acute and chronic respiratory failure with hypoxia: Secondary | ICD-10-CM | POA: Diagnosis not present

## 2022-10-26 DIAGNOSIS — Z720 Tobacco use: Secondary | ICD-10-CM | POA: Diagnosis not present

## 2022-10-26 DIAGNOSIS — G4489 Other headache syndrome: Secondary | ICD-10-CM | POA: Diagnosis not present

## 2022-10-26 DIAGNOSIS — H548 Legal blindness, as defined in USA: Secondary | ICD-10-CM | POA: Diagnosis not present

## 2022-10-26 DIAGNOSIS — G8929 Other chronic pain: Secondary | ICD-10-CM | POA: Diagnosis not present

## 2022-10-26 DIAGNOSIS — E1151 Type 2 diabetes mellitus with diabetic peripheral angiopathy without gangrene: Secondary | ICD-10-CM | POA: Diagnosis not present

## 2022-10-26 DIAGNOSIS — E778 Other disorders of glycoprotein metabolism: Secondary | ICD-10-CM | POA: Diagnosis not present

## 2022-10-26 DIAGNOSIS — G47419 Narcolepsy without cataplexy: Secondary | ICD-10-CM | POA: Diagnosis not present

## 2022-10-26 DIAGNOSIS — F32A Depression, unspecified: Secondary | ICD-10-CM | POA: Diagnosis not present

## 2022-10-26 DIAGNOSIS — I959 Hypotension, unspecified: Secondary | ICD-10-CM | POA: Diagnosis not present

## 2022-10-26 DIAGNOSIS — G928 Other toxic encephalopathy: Secondary | ICD-10-CM | POA: Diagnosis not present

## 2022-10-26 DIAGNOSIS — I10 Essential (primary) hypertension: Secondary | ICD-10-CM | POA: Diagnosis not present

## 2022-10-26 DIAGNOSIS — S199XXA Unspecified injury of neck, initial encounter: Secondary | ICD-10-CM | POA: Diagnosis not present

## 2022-10-26 DIAGNOSIS — J440 Chronic obstructive pulmonary disease with acute lower respiratory infection: Secondary | ICD-10-CM | POA: Diagnosis not present

## 2022-10-26 DIAGNOSIS — D649 Anemia, unspecified: Secondary | ICD-10-CM | POA: Diagnosis not present

## 2022-10-26 DIAGNOSIS — I2510 Atherosclerotic heart disease of native coronary artery without angina pectoris: Secondary | ICD-10-CM | POA: Diagnosis not present

## 2022-10-26 DIAGNOSIS — J690 Pneumonitis due to inhalation of food and vomit: Secondary | ICD-10-CM | POA: Diagnosis not present

## 2022-10-26 DIAGNOSIS — J9622 Acute and chronic respiratory failure with hypercapnia: Secondary | ICD-10-CM | POA: Diagnosis not present

## 2022-10-26 DIAGNOSIS — R911 Solitary pulmonary nodule: Secondary | ICD-10-CM | POA: Diagnosis not present

## 2022-10-26 DIAGNOSIS — R404 Transient alteration of awareness: Secondary | ICD-10-CM | POA: Diagnosis not present

## 2022-11-11 DIAGNOSIS — Z79899 Other long term (current) drug therapy: Secondary | ICD-10-CM | POA: Diagnosis not present

## 2022-11-11 DIAGNOSIS — Z79891 Long term (current) use of opiate analgesic: Secondary | ICD-10-CM | POA: Diagnosis not present

## 2022-11-11 DIAGNOSIS — G894 Chronic pain syndrome: Secondary | ICD-10-CM | POA: Diagnosis not present

## 2022-11-11 DIAGNOSIS — M545 Low back pain, unspecified: Secondary | ICD-10-CM | POA: Diagnosis not present

## 2022-11-11 DIAGNOSIS — M542 Cervicalgia: Secondary | ICD-10-CM | POA: Diagnosis not present

## 2022-11-14 DIAGNOSIS — M545 Low back pain, unspecified: Secondary | ICD-10-CM | POA: Diagnosis not present

## 2022-11-14 DIAGNOSIS — R29818 Other symptoms and signs involving the nervous system: Secondary | ICD-10-CM | POA: Diagnosis not present

## 2022-11-14 DIAGNOSIS — G8929 Other chronic pain: Secondary | ICD-10-CM | POA: Diagnosis not present

## 2022-11-14 DIAGNOSIS — C3490 Malignant neoplasm of unspecified part of unspecified bronchus or lung: Secondary | ICD-10-CM | POA: Diagnosis not present

## 2022-11-14 DIAGNOSIS — Z7984 Long term (current) use of oral hypoglycemic drugs: Secondary | ICD-10-CM | POA: Diagnosis not present

## 2022-11-14 DIAGNOSIS — Z79899 Other long term (current) drug therapy: Secondary | ICD-10-CM | POA: Diagnosis not present

## 2022-11-14 DIAGNOSIS — G459 Transient cerebral ischemic attack, unspecified: Secondary | ICD-10-CM | POA: Diagnosis not present

## 2022-11-14 DIAGNOSIS — Z87891 Personal history of nicotine dependence: Secondary | ICD-10-CM | POA: Diagnosis not present

## 2022-11-14 DIAGNOSIS — Z743 Need for continuous supervision: Secondary | ICD-10-CM | POA: Diagnosis not present

## 2022-11-14 DIAGNOSIS — I499 Cardiac arrhythmia, unspecified: Secondary | ICD-10-CM | POA: Diagnosis not present

## 2022-11-14 DIAGNOSIS — R2990 Unspecified symptoms and signs involving the nervous system: Secondary | ICD-10-CM | POA: Diagnosis not present

## 2022-11-14 DIAGNOSIS — E119 Type 2 diabetes mellitus without complications: Secondary | ICD-10-CM | POA: Diagnosis not present

## 2022-11-14 DIAGNOSIS — I10 Essential (primary) hypertension: Secondary | ICD-10-CM | POA: Diagnosis not present

## 2022-11-14 DIAGNOSIS — R531 Weakness: Secondary | ICD-10-CM | POA: Diagnosis not present

## 2022-11-14 DIAGNOSIS — I6523 Occlusion and stenosis of bilateral carotid arteries: Secondary | ICD-10-CM | POA: Diagnosis not present

## 2022-11-14 DIAGNOSIS — F17210 Nicotine dependence, cigarettes, uncomplicated: Secondary | ICD-10-CM | POA: Diagnosis not present

## 2022-11-14 DIAGNOSIS — R29810 Facial weakness: Secondary | ICD-10-CM | POA: Diagnosis not present

## 2022-11-15 DIAGNOSIS — R29810 Facial weakness: Secondary | ICD-10-CM | POA: Diagnosis not present

## 2022-11-15 DIAGNOSIS — R2990 Unspecified symptoms and signs involving the nervous system: Secondary | ICD-10-CM | POA: Diagnosis not present

## 2022-11-15 DIAGNOSIS — F17210 Nicotine dependence, cigarettes, uncomplicated: Secondary | ICD-10-CM | POA: Diagnosis not present

## 2022-12-09 DIAGNOSIS — G894 Chronic pain syndrome: Secondary | ICD-10-CM | POA: Diagnosis not present

## 2022-12-09 DIAGNOSIS — M545 Low back pain, unspecified: Secondary | ICD-10-CM | POA: Diagnosis not present

## 2022-12-09 DIAGNOSIS — M542 Cervicalgia: Secondary | ICD-10-CM | POA: Diagnosis not present

## 2022-12-21 ENCOUNTER — Encounter: Payer: Self-pay | Admitting: Cardiology

## 2022-12-21 ENCOUNTER — Ambulatory Visit: Payer: 59 | Attending: Cardiology | Admitting: Cardiology

## 2022-12-21 VITALS — BP 132/68 | HR 68 | Ht 62.0 in | Wt 158.8 lb

## 2022-12-21 DIAGNOSIS — Z86711 Personal history of pulmonary embolism: Secondary | ICD-10-CM

## 2022-12-21 DIAGNOSIS — E1140 Type 2 diabetes mellitus with diabetic neuropathy, unspecified: Secondary | ICD-10-CM | POA: Diagnosis not present

## 2022-12-21 DIAGNOSIS — I2510 Atherosclerotic heart disease of native coronary artery without angina pectoris: Secondary | ICD-10-CM

## 2022-12-21 DIAGNOSIS — E088 Diabetes mellitus due to underlying condition with unspecified complications: Secondary | ICD-10-CM

## 2022-12-21 DIAGNOSIS — Z7984 Long term (current) use of oral hypoglycemic drugs: Secondary | ICD-10-CM

## 2022-12-21 NOTE — Patient Instructions (Signed)
Medication Instructions:  Your physician recommends that you continue on your current medications as directed. Please refer to the Current Medication list given to you today.  *If you need a refill on your cardiac medications before your next appointment, please call your pharmacy*   Lab Work: None ordered If you have labs (blood work) drawn today and your tests are completely normal, you will receive your results only by: MyChart Message (if you have MyChart) OR A paper copy in the mail If you have any lab test that is abnormal or we need to change your treatment, we will call you to review the results.   Testing/Procedures: None ordered   Follow-Up: At [facility], you and your health needs are our priority.  As part of our continuing mission to provide you with exceptional heart care, we have created designated Provider Care Teams.  These Care Teams include your primary Cardiologist (physician) and Advanced Practice Providers (APPs -  Physician Assistants and Nurse Practitioners) who all work together to provide you with the care you need, when you need it.  We recommend signing up for the patient portal called "MyChart".  Sign up information is provided on this After Visit Summary.  MyChart is used to connect with patients for Virtual Visits (Telemedicine).  Patients are able to view lab/test results, encounter notes, upcoming appointments, etc.  Non-urgent messages can be sent to your provider as well.   To learn more about what you can do with MyChart, go to [email or web address].com.    Your next appointment:   12 month(s)  The format for your next appointment:   In Person  Provider:   Rajan Revankar, MD    Other Instructions none  Important Information About [place]      
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
## 2022-12-22 DIAGNOSIS — H9203 Otalgia, bilateral: Secondary | ICD-10-CM | POA: Diagnosis not present

## 2022-12-22 DIAGNOSIS — Z09 Encounter for follow-up examination after completed treatment for conditions other than malignant neoplasm: Secondary | ICD-10-CM | POA: Diagnosis not present

## 2022-12-22 DIAGNOSIS — W57XXXA Bitten or stung by nonvenomous insect and other nonvenomous arthropods, initial encounter: Secondary | ICD-10-CM | POA: Diagnosis not present

## 2022-12-22 DIAGNOSIS — H60509 Unspecified acute noninfective otitis externa, unspecified ear: Secondary | ICD-10-CM | POA: Diagnosis not present

## 2023-01-06 DIAGNOSIS — M542 Cervicalgia: Secondary | ICD-10-CM | POA: Diagnosis not present

## 2023-01-06 DIAGNOSIS — M545 Low back pain, unspecified: Secondary | ICD-10-CM | POA: Diagnosis not present

## 2023-01-06 DIAGNOSIS — G894 Chronic pain syndrome: Secondary | ICD-10-CM | POA: Diagnosis not present

## 2023-01-26 ENCOUNTER — Encounter: Payer: Self-pay | Admitting: Internal Medicine

## 2023-02-03 DIAGNOSIS — G8929 Other chronic pain: Secondary | ICD-10-CM | POA: Diagnosis not present

## 2023-02-03 DIAGNOSIS — I739 Peripheral vascular disease, unspecified: Secondary | ICD-10-CM | POA: Diagnosis not present

## 2023-02-03 DIAGNOSIS — Z8673 Personal history of transient ischemic attack (TIA), and cerebral infarction without residual deficits: Secondary | ICD-10-CM | POA: Diagnosis not present

## 2023-02-03 DIAGNOSIS — M7989 Other specified soft tissue disorders: Secondary | ICD-10-CM | POA: Diagnosis not present

## 2023-02-03 DIAGNOSIS — I10 Essential (primary) hypertension: Secondary | ICD-10-CM | POA: Diagnosis not present

## 2023-02-03 DIAGNOSIS — E538 Deficiency of other specified B group vitamins: Secondary | ICD-10-CM | POA: Diagnosis not present

## 2023-02-03 DIAGNOSIS — E1151 Type 2 diabetes mellitus with diabetic peripheral angiopathy without gangrene: Secondary | ICD-10-CM | POA: Diagnosis not present

## 2023-02-03 DIAGNOSIS — Z1231 Encounter for screening mammogram for malignant neoplasm of breast: Secondary | ICD-10-CM | POA: Diagnosis not present

## 2023-02-03 DIAGNOSIS — E559 Vitamin D deficiency, unspecified: Secondary | ICD-10-CM | POA: Diagnosis not present

## 2023-02-03 DIAGNOSIS — J449 Chronic obstructive pulmonary disease, unspecified: Secondary | ICD-10-CM | POA: Diagnosis not present

## 2023-02-03 DIAGNOSIS — E785 Hyperlipidemia, unspecified: Secondary | ICD-10-CM | POA: Diagnosis not present

## 2023-02-03 DIAGNOSIS — Z79899 Other long term (current) drug therapy: Secondary | ICD-10-CM | POA: Diagnosis not present

## 2023-02-08 DIAGNOSIS — I739 Peripheral vascular disease, unspecified: Secondary | ICD-10-CM | POA: Diagnosis not present

## 2023-02-08 DIAGNOSIS — I6523 Occlusion and stenosis of bilateral carotid arteries: Secondary | ICD-10-CM | POA: Diagnosis not present

## 2023-02-16 DIAGNOSIS — G894 Chronic pain syndrome: Secondary | ICD-10-CM | POA: Diagnosis not present

## 2023-02-16 DIAGNOSIS — M542 Cervicalgia: Secondary | ICD-10-CM | POA: Diagnosis not present

## 2023-02-23 DIAGNOSIS — J449 Chronic obstructive pulmonary disease, unspecified: Secondary | ICD-10-CM | POA: Diagnosis not present

## 2023-03-23 DIAGNOSIS — M542 Cervicalgia: Secondary | ICD-10-CM | POA: Diagnosis not present

## 2023-03-23 DIAGNOSIS — G894 Chronic pain syndrome: Secondary | ICD-10-CM | POA: Diagnosis not present

## 2023-03-26 DIAGNOSIS — J449 Chronic obstructive pulmonary disease, unspecified: Secondary | ICD-10-CM | POA: Diagnosis not present

## 2023-04-26 DIAGNOSIS — J449 Chronic obstructive pulmonary disease, unspecified: Secondary | ICD-10-CM | POA: Diagnosis not present

## 2023-04-28 DIAGNOSIS — G894 Chronic pain syndrome: Secondary | ICD-10-CM | POA: Diagnosis not present

## 2023-04-28 DIAGNOSIS — M542 Cervicalgia: Secondary | ICD-10-CM | POA: Diagnosis not present

## 2023-04-28 DIAGNOSIS — M545 Low back pain, unspecified: Secondary | ICD-10-CM | POA: Diagnosis not present

## 2023-04-30 DIAGNOSIS — C3411 Malignant neoplasm of upper lobe, right bronchus or lung: Secondary | ICD-10-CM | POA: Diagnosis not present

## 2023-05-19 DIAGNOSIS — C3411 Malignant neoplasm of upper lobe, right bronchus or lung: Secondary | ICD-10-CM | POA: Diagnosis not present

## 2023-05-19 DIAGNOSIS — I700 Atherosclerosis of aorta: Secondary | ICD-10-CM | POA: Diagnosis not present

## 2023-05-19 DIAGNOSIS — R590 Localized enlarged lymph nodes: Secondary | ICD-10-CM | POA: Diagnosis not present

## 2023-05-19 DIAGNOSIS — J439 Emphysema, unspecified: Secondary | ICD-10-CM | POA: Diagnosis not present

## 2023-05-19 DIAGNOSIS — Z902 Acquired absence of lung [part of]: Secondary | ICD-10-CM | POA: Diagnosis not present

## 2023-05-19 DIAGNOSIS — Z923 Personal history of irradiation: Secondary | ICD-10-CM | POA: Diagnosis not present

## 2023-05-19 DIAGNOSIS — J984 Other disorders of lung: Secondary | ICD-10-CM | POA: Diagnosis not present

## 2023-05-19 DIAGNOSIS — C3490 Malignant neoplasm of unspecified part of unspecified bronchus or lung: Secondary | ICD-10-CM | POA: Diagnosis not present

## 2023-05-19 DIAGNOSIS — R911 Solitary pulmonary nodule: Secondary | ICD-10-CM | POA: Diagnosis not present

## 2023-05-26 DIAGNOSIS — M542 Cervicalgia: Secondary | ICD-10-CM | POA: Diagnosis not present

## 2023-05-26 DIAGNOSIS — M545 Low back pain, unspecified: Secondary | ICD-10-CM | POA: Diagnosis not present

## 2023-05-26 DIAGNOSIS — J449 Chronic obstructive pulmonary disease, unspecified: Secondary | ICD-10-CM | POA: Diagnosis not present

## 2023-05-26 DIAGNOSIS — G894 Chronic pain syndrome: Secondary | ICD-10-CM | POA: Diagnosis not present

## 2023-06-22 DIAGNOSIS — I10 Essential (primary) hypertension: Secondary | ICD-10-CM | POA: Diagnosis not present

## 2023-06-22 DIAGNOSIS — Z23 Encounter for immunization: Secondary | ICD-10-CM | POA: Diagnosis not present

## 2023-06-22 DIAGNOSIS — E538 Deficiency of other specified B group vitamins: Secondary | ICD-10-CM | POA: Diagnosis not present

## 2023-06-22 DIAGNOSIS — Z8679 Personal history of other diseases of the circulatory system: Secondary | ICD-10-CM | POA: Diagnosis not present

## 2023-06-22 DIAGNOSIS — Z8673 Personal history of transient ischemic attack (TIA), and cerebral infarction without residual deficits: Secondary | ICD-10-CM | POA: Diagnosis not present

## 2023-06-22 DIAGNOSIS — I739 Peripheral vascular disease, unspecified: Secondary | ICD-10-CM | POA: Diagnosis not present

## 2023-06-22 DIAGNOSIS — M7989 Other specified soft tissue disorders: Secondary | ICD-10-CM | POA: Diagnosis not present

## 2023-06-22 DIAGNOSIS — E1151 Type 2 diabetes mellitus with diabetic peripheral angiopathy without gangrene: Secondary | ICD-10-CM | POA: Diagnosis not present

## 2023-06-22 DIAGNOSIS — E785 Hyperlipidemia, unspecified: Secondary | ICD-10-CM | POA: Diagnosis not present

## 2023-06-22 DIAGNOSIS — Z79899 Other long term (current) drug therapy: Secondary | ICD-10-CM | POA: Diagnosis not present

## 2023-06-22 DIAGNOSIS — E559 Vitamin D deficiency, unspecified: Secondary | ICD-10-CM | POA: Diagnosis not present

## 2023-06-23 DIAGNOSIS — M542 Cervicalgia: Secondary | ICD-10-CM | POA: Diagnosis not present

## 2023-06-23 DIAGNOSIS — M545 Low back pain, unspecified: Secondary | ICD-10-CM | POA: Diagnosis not present

## 2023-06-23 DIAGNOSIS — Z79891 Long term (current) use of opiate analgesic: Secondary | ICD-10-CM | POA: Diagnosis not present

## 2023-06-23 DIAGNOSIS — G894 Chronic pain syndrome: Secondary | ICD-10-CM | POA: Diagnosis not present

## 2023-06-23 DIAGNOSIS — Z79899 Other long term (current) drug therapy: Secondary | ICD-10-CM | POA: Diagnosis not present

## 2023-06-26 DIAGNOSIS — J449 Chronic obstructive pulmonary disease, unspecified: Secondary | ICD-10-CM | POA: Diagnosis not present

## 2023-07-21 DIAGNOSIS — G894 Chronic pain syndrome: Secondary | ICD-10-CM | POA: Diagnosis not present

## 2023-07-26 DIAGNOSIS — J449 Chronic obstructive pulmonary disease, unspecified: Secondary | ICD-10-CM | POA: Diagnosis not present

## 2023-08-24 DIAGNOSIS — Z7982 Long term (current) use of aspirin: Secondary | ICD-10-CM | POA: Diagnosis not present

## 2023-08-24 DIAGNOSIS — Z85118 Personal history of other malignant neoplasm of bronchus and lung: Secondary | ICD-10-CM | POA: Diagnosis not present

## 2023-08-24 DIAGNOSIS — I2510 Atherosclerotic heart disease of native coronary artery without angina pectoris: Secondary | ICD-10-CM | POA: Diagnosis not present

## 2023-08-24 DIAGNOSIS — R918 Other nonspecific abnormal finding of lung field: Secondary | ICD-10-CM | POA: Diagnosis not present

## 2023-08-24 DIAGNOSIS — G8929 Other chronic pain: Secondary | ICD-10-CM | POA: Diagnosis not present

## 2023-08-24 DIAGNOSIS — F17210 Nicotine dependence, cigarettes, uncomplicated: Secondary | ICD-10-CM | POA: Diagnosis not present

## 2023-08-24 DIAGNOSIS — R079 Chest pain, unspecified: Secondary | ICD-10-CM | POA: Diagnosis not present

## 2023-08-24 DIAGNOSIS — Z8673 Personal history of transient ischemic attack (TIA), and cerebral infarction without residual deficits: Secondary | ICD-10-CM | POA: Diagnosis not present

## 2023-08-24 DIAGNOSIS — Z7984 Long term (current) use of oral hypoglycemic drugs: Secondary | ICD-10-CM | POA: Diagnosis not present

## 2023-08-24 DIAGNOSIS — I10 Essential (primary) hypertension: Secondary | ICD-10-CM | POA: Diagnosis not present

## 2023-08-24 DIAGNOSIS — R0789 Other chest pain: Secondary | ICD-10-CM | POA: Diagnosis not present

## 2023-08-24 DIAGNOSIS — E119 Type 2 diabetes mellitus without complications: Secondary | ICD-10-CM | POA: Diagnosis not present

## 2023-08-24 DIAGNOSIS — Z955 Presence of coronary angioplasty implant and graft: Secondary | ICD-10-CM | POA: Diagnosis not present

## 2023-08-24 DIAGNOSIS — R0902 Hypoxemia: Secondary | ICD-10-CM | POA: Diagnosis not present

## 2023-08-24 DIAGNOSIS — R599 Enlarged lymph nodes, unspecified: Secondary | ICD-10-CM | POA: Diagnosis not present

## 2023-08-24 DIAGNOSIS — R0602 Shortness of breath: Secondary | ICD-10-CM | POA: Diagnosis not present

## 2023-08-24 DIAGNOSIS — Z79899 Other long term (current) drug therapy: Secondary | ICD-10-CM | POA: Diagnosis not present

## 2023-08-25 DIAGNOSIS — R079 Chest pain, unspecified: Secondary | ICD-10-CM | POA: Diagnosis not present

## 2023-08-25 DIAGNOSIS — G8929 Other chronic pain: Secondary | ICD-10-CM | POA: Diagnosis not present

## 2023-08-25 DIAGNOSIS — I2510 Atherosclerotic heart disease of native coronary artery without angina pectoris: Secondary | ICD-10-CM | POA: Diagnosis not present

## 2023-08-26 DIAGNOSIS — J449 Chronic obstructive pulmonary disease, unspecified: Secondary | ICD-10-CM | POA: Diagnosis not present

## 2023-08-27 DIAGNOSIS — G894 Chronic pain syndrome: Secondary | ICD-10-CM | POA: Diagnosis not present

## 2023-08-27 DIAGNOSIS — M542 Cervicalgia: Secondary | ICD-10-CM | POA: Diagnosis not present

## 2023-08-27 DIAGNOSIS — M5106 Intervertebral disc disorders with myelopathy, lumbar region: Secondary | ICD-10-CM | POA: Diagnosis not present

## 2023-09-14 ENCOUNTER — Encounter: Payer: Self-pay | Admitting: Specialist

## 2023-09-23 DIAGNOSIS — G43009 Migraine without aura, not intractable, without status migrainosus: Secondary | ICD-10-CM | POA: Diagnosis not present

## 2023-09-26 DIAGNOSIS — J449 Chronic obstructive pulmonary disease, unspecified: Secondary | ICD-10-CM | POA: Diagnosis not present

## 2023-10-14 DIAGNOSIS — M542 Cervicalgia: Secondary | ICD-10-CM | POA: Diagnosis not present

## 2023-10-14 DIAGNOSIS — G894 Chronic pain syndrome: Secondary | ICD-10-CM | POA: Diagnosis not present

## 2023-10-24 DIAGNOSIS — J449 Chronic obstructive pulmonary disease, unspecified: Secondary | ICD-10-CM | POA: Diagnosis not present

## 2023-11-11 DIAGNOSIS — G894 Chronic pain syndrome: Secondary | ICD-10-CM | POA: Diagnosis not present

## 2023-11-11 DIAGNOSIS — M542 Cervicalgia: Secondary | ICD-10-CM | POA: Diagnosis not present

## 2023-11-11 DIAGNOSIS — M5106 Intervertebral disc disorders with myelopathy, lumbar region: Secondary | ICD-10-CM | POA: Diagnosis not present

## 2023-11-18 DIAGNOSIS — I6523 Occlusion and stenosis of bilateral carotid arteries: Secondary | ICD-10-CM | POA: Diagnosis not present

## 2023-11-18 DIAGNOSIS — I739 Peripheral vascular disease, unspecified: Secondary | ICD-10-CM | POA: Diagnosis not present

## 2023-11-24 DIAGNOSIS — J449 Chronic obstructive pulmonary disease, unspecified: Secondary | ICD-10-CM | POA: Diagnosis not present

## 2023-11-24 DIAGNOSIS — I739 Peripheral vascular disease, unspecified: Secondary | ICD-10-CM | POA: Diagnosis not present

## 2023-11-24 DIAGNOSIS — I6523 Occlusion and stenosis of bilateral carotid arteries: Secondary | ICD-10-CM | POA: Diagnosis not present

## 2023-11-25 DIAGNOSIS — I700 Atherosclerosis of aorta: Secondary | ICD-10-CM | POA: Diagnosis not present

## 2023-11-25 DIAGNOSIS — Z923 Personal history of irradiation: Secondary | ICD-10-CM | POA: Diagnosis not present

## 2023-11-25 DIAGNOSIS — R599 Enlarged lymph nodes, unspecified: Secondary | ICD-10-CM | POA: Diagnosis not present

## 2023-11-25 DIAGNOSIS — R590 Localized enlarged lymph nodes: Secondary | ICD-10-CM | POA: Diagnosis not present

## 2023-11-25 DIAGNOSIS — C3411 Malignant neoplasm of upper lobe, right bronchus or lung: Secondary | ICD-10-CM | POA: Diagnosis not present

## 2023-11-25 DIAGNOSIS — C3490 Malignant neoplasm of unspecified part of unspecified bronchus or lung: Secondary | ICD-10-CM | POA: Diagnosis not present

## 2023-12-02 DIAGNOSIS — C3411 Malignant neoplasm of upper lobe, right bronchus or lung: Secondary | ICD-10-CM | POA: Diagnosis not present

## 2023-12-02 DIAGNOSIS — Z923 Personal history of irradiation: Secondary | ICD-10-CM | POA: Diagnosis not present

## 2023-12-15 DIAGNOSIS — M542 Cervicalgia: Secondary | ICD-10-CM | POA: Diagnosis not present

## 2023-12-15 DIAGNOSIS — G894 Chronic pain syndrome: Secondary | ICD-10-CM | POA: Diagnosis not present

## 2023-12-15 DIAGNOSIS — R110 Nausea: Secondary | ICD-10-CM | POA: Diagnosis not present

## 2024-01-18 ENCOUNTER — Ambulatory Visit: Admitting: Cardiology

## 2024-01-25 DIAGNOSIS — G894 Chronic pain syndrome: Secondary | ICD-10-CM | POA: Diagnosis not present

## 2024-01-25 DIAGNOSIS — M545 Low back pain, unspecified: Secondary | ICD-10-CM | POA: Diagnosis not present

## 2024-01-25 DIAGNOSIS — M542 Cervicalgia: Secondary | ICD-10-CM | POA: Diagnosis not present

## 2024-01-27 ENCOUNTER — Ambulatory Visit: Admitting: Cardiology

## 2024-01-27 ENCOUNTER — Other Ambulatory Visit (HOSPITAL_BASED_OUTPATIENT_CLINIC_OR_DEPARTMENT_OTHER): Payer: Self-pay

## 2024-01-27 ENCOUNTER — Encounter (HOSPITAL_BASED_OUTPATIENT_CLINIC_OR_DEPARTMENT_OTHER): Payer: Self-pay | Admitting: Family Medicine

## 2024-01-27 ENCOUNTER — Ambulatory Visit (HOSPITAL_BASED_OUTPATIENT_CLINIC_OR_DEPARTMENT_OTHER): Admitting: Family Medicine

## 2024-01-27 VITALS — BP 103/65 | HR 94 | Temp 98.0°F | Resp 16 | Ht 60.43 in | Wt 150.0 lb

## 2024-01-27 DIAGNOSIS — F17210 Nicotine dependence, cigarettes, uncomplicated: Secondary | ICD-10-CM | POA: Diagnosis not present

## 2024-01-27 DIAGNOSIS — R269 Unspecified abnormalities of gait and mobility: Secondary | ICD-10-CM

## 2024-01-27 DIAGNOSIS — Z5982 Transportation insecurity: Secondary | ICD-10-CM

## 2024-01-27 DIAGNOSIS — F32A Depression, unspecified: Secondary | ICD-10-CM

## 2024-01-27 DIAGNOSIS — S161XXA Strain of muscle, fascia and tendon at neck level, initial encounter: Secondary | ICD-10-CM | POA: Insufficient documentation

## 2024-01-27 DIAGNOSIS — Z9181 History of falling: Secondary | ICD-10-CM

## 2024-01-27 HISTORY — DX: Strain of muscle, fascia and tendon at neck level, initial encounter: S16.1XXA

## 2024-01-27 MED ORDER — ESCITALOPRAM OXALATE 20 MG PO TABS
10.0000 mg | ORAL_TABLET | Freq: Every morning | ORAL | 0 refills | Status: DC
  Filled 2024-01-27: qty 30, 60d supply, fill #0

## 2024-01-27 NOTE — Progress Notes (Signed)
 Established Patient Office Visit  Subjective   Patient ID: Barbara French, female    DOB: 1956-04-04  Age: 68 y.o. MRN: [id number]  Chief Complaint  Patient presents with   Establish Care    Here to establish care. Had a stroke 3-4 years ago and went blind.    Wound    Last PCP said she was diabetic, she is not sure. Wounds are not healing fast. Clemens about 1 month ago. Has a scab on right leg. Last A1c was done 06/2023, 6.6.    Nausea    Has been nauseous for 1 week. Cannot throw up due to past esophagus surgery (thinks it was wrapped)   Medication Refill    Needs refill in meclizine , nitrostat , lexapro , and dilaudid .     F/u as above.  Patient states she was doing OK until she fell last night.  States she fell in her bathroom after simply losing her balance.  Unclear if she hit her head.  Unsure if she lost consciousness.  Denies any current headache.  Neck has mild chronic discomfort.  She lives alone and states she has no family support.  Uses a cane for ambulation.  She now downplays her nausea and we focused on her recent fall today.  She certainly appears to have recovered nicely.  Medication Refill Pertinent negatives include no abdominal pain, chest pain, fever, headaches or rash.    Past Medical History:  Diagnosis Date   Anxiety    Asthma    Atheroscler of native artery of both legs with intermit claudication (HCC) 12/16/2015   Atherosclerotic heart disease of native coronary artery without angina pectoris 08/29/2015   Chronic laryngitis 09/29/2016   Overview:  Hyperkeratosis per biopsy 10/2016  Last Assessment & Plan:  Concern over worsening hoarseness. Chronic history of hoarseness with history of hyperkeratosis of the larynx secondary to chronic tobacco use.  She feels her hoarseness is gradually getting worse.  She is on pantoprazole  40 mg once a day.  Denies any pain in the throat. EXAM shows moderately raspy voice without audible stridor.   Cigarette smoker 05/07/2015    Counseled re importance of smoking cessation but did not meet time criteria for separate billing     I had an extended discussion with the patient reviewing all relevant studies completed to date and  lasting 15 to 20 minutes of a 25 minute visit    I performed detailed device teaching using a teach back method which extended face to face time for this visit (see above)  Each maintenance medicatio   Coronary artery disease involving native coronary artery of native heart with angina pectoris (HCC) 02/23/2017   Depression    Diabetes mellitus due to underlying condition with unspecified complications (HCC) 05/07/2015   Dyslipidemia    Essential hypertension 02/23/2017   GERD (gastroesophageal reflux disease)    History of pulmonary embolism 02/23/2017   Hyperkalemia 12/02/2020   Hyponatremia 01/27/2016   Interstitial lung disease (HCC) 02/12/2016   Lung nodule 04/06/2022   Migraine without aura and without status migrainosus, not intractable 07/08/2022   Moderate COPD (chronic obstructive pulmonary disease) (HCC) 02/12/2016   Myocardial infarction (HCC)    Narcolepsy    Neoplasm of larynx 09/29/2016   Overview:  Hyperkeratosis per biopsy 10/2016  Last Assessment & Plan:  6 weeks postop direct laryngoscopy with excision of bilateral leukoplakic lesions. Still having some hoarseness.  Last week basically lost her voice acutely.  Voice has improved but is still not  back to preoperative state.  She has cut back on her smoking. EXAM shows a mildly raspy voice.  No stridor.  Indirect laryngoscopy was    Neuropathy    PAD (peripheral artery disease) (HCC) 05/08/2019   Personal history of transient ischemic attack (TIA), and cerebral infarction without residual deficits 12/12/2021   Pharyngoesophageal dysphagia 03/23/2016   Pneumonia due to COVID-19 virus 03/27/2021   Post-traumatic osteoarthritis of right knee 07/25/2015   RLS (restless legs syndrome) 07/28/2016   Seizure disorder (HCC) 02/02/2022    Sleep apnea    states that she no longer has sleep apnea   Vitamin D deficiency     Outpatient Encounter Medications as of 01/27/2024  Medication Sig   ASPIRIN  81 PO Take 81 mg by mouth daily.   cyclobenzaprine  (FLEXERIL ) 10 MG tablet Take 10 mg by mouth 3 (three) times daily.   docusate sodium  (COLACE) 100 MG capsule Take 100 mg by mouth daily.   furosemide (LASIX) 20 MG tablet Take 20 mg by mouth as needed for edema or fluid.   HYDROmorphone  (DILAUDID ) 8 MG tablet Take 4 mg by mouth every 8 (eight) hours as needed.   meclizine  (ANTIVERT ) 12.5 MG tablet Take 12.5 mg by mouth as needed for dizziness.   pantoprazole  (PROTONIX ) 40 MG tablet Take 40 mg by mouth every morning.   zonisamide (ZONEGRAN) 100 MG capsule 1 capsule in the AM & 2 capsules in the PM   acetaminophen  (TYLENOL ) 500 MG tablet Take 1,000 mg by mouth 4 (four) times daily as needed for moderate pain or headache. (Patient not taking: Reported on 01/27/2024)   albuterol  (VENTOLIN  HFA) 108 (90 Base) MCG/ACT inhaler Inhale 1-2 puffs into the lungs 4 (four) times daily as needed for wheezing or shortness of breath. (Patient not taking: Reported on 01/27/2024)   atorvastatin  (LIPITOR) 80 MG tablet Take 80 mg by mouth daily. (Patient not taking: Reported on 01/27/2024)   cilostazol (PLETAL) 50 MG tablet Take 50 mg by mouth 2 (two) times daily. (Patient not taking: Reported on 01/27/2024)   EPINEPHrine  0.3 mg/0.3 mL IJ SOAJ injection Inject 0.3 mg into the muscle once as needed for anaphylaxis. (Patient not taking: Reported on 01/27/2024)   escitalopram  (LEXAPRO ) 20 MG tablet Take 0.5 tablets (10 mg total) by mouth every morning.   ferrous sulfate 325 (65 FE) MG EC tablet Take 325 mg by mouth daily.   gabapentin  (NEURONTIN ) 300 MG capsule Take 300 mg by mouth 3 (three) times daily as needed (pain). (Patient not taking: Reported on 01/27/2024)   HYDROmorphone  (DILAUDID ) 4 MG tablet Take 4 mg by mouth 4 (four) times daily as needed (pain).  (Patient not taking: Reported on 01/27/2024)   KLOR-CON M10 10 MEQ tablet Take 10 mEq by mouth daily. (Patient not taking: Reported on 01/27/2024)   losartan  (COZAAR ) 50 MG tablet Take 50 mg by mouth every morning. (Patient not taking: Reported on 01/27/2024)   meclizine  (ANTIVERT ) 12.5 MG tablet Take 12.5 mg by mouth as needed for dizziness. (Patient not taking: Reported on 01/27/2024)   metFORMIN  (GLUCOPHAGE ) 500 MG tablet Take 500 mg by mouth 2 (two) times daily with a meal. (Patient not taking: Reported on 01/27/2024)   nitroGLYCERIN  (NITROSTAT ) 0.4 MG SL tablet Place 1 tablet (0.4 mg total) under the tongue every 5 (five) minutes as needed for chest pain. (Patient not taking: Reported on 01/27/2024)   rosuvastatin (CRESTOR) 20 MG tablet Take 20 mg by mouth at bedtime. (Patient not taking: Reported on 01/27/2024)  sitaGLIPtin (JANUVIA) 100 MG tablet Take 100 mg by mouth every morning. (Patient not taking: Reported on 01/27/2024)   [DISCONTINUED] escitalopram  (LEXAPRO ) 20 MG tablet Take 20 mg by mouth every morning. (Patient not taking: Reported on 01/27/2024)   [DISCONTINUED] Vitamin D, Ergocalciferol, (DRISDOL) 1.25 MG (50000 UNIT) CAPS capsule Take 50,000 Units by mouth once a week. (Patient not taking: Reported on 01/27/2024)   No facility-administered encounter medications on file as of 01/27/2024.    Social History   Tobacco Use   Smoking status: Every Day    Current packs/day: 0.50    Average packs/day: 0.5 packs/day for 61.5 years (30.7 ttl pk-yrs)    Types: Cigarettes    Start date: 1964    Passive exposure: Current   Smokeless tobacco: Never  Vaping Use   Vaping status: Former  Substance Use Topics   Alcohol use: Not Currently    Comment: rarely   Drug use: No      Review of Systems  Constitutional:  Negative for fever and malaise/fatigue.  HENT:  Negative for hearing loss.   Cardiovascular:  Negative for chest pain.  Gastrointestinal:  Negative for abdominal pain.  Skin:   Negative for rash.  Neurological:  Negative for dizziness, focal weakness, seizures and headaches.      Objective:     BP 103/65   Pulse 94   Temp 98 F (36.7 C) (Oral)   Resp 16   Ht 5' 0.43 (1.535 m)   Wt 150 lb (68 kg)   SpO2 95%   BMI 28.88 kg/m    Physical Exam Constitutional:      General: She is not in acute distress.    Appearance: Normal appearance.     Comments: Ambulates with a cane (not new).  Comfortable and moving neck easily.  HENT:     Head: Normocephalic.   Cardiovascular:     Rate and Rhythm: Normal rate and regular rhythm.     Pulses: Normal pulses.     Heart sounds: Normal heart sounds.  Pulmonary:     Effort: Pulmonary effort is normal.     Breath sounds: Normal breath sounds.  Abdominal:     General: Bowel sounds are normal.     Palpations: Abdomen is soft.   Musculoskeletal:     Cervical back: Neck supple. No tenderness.     Right lower leg: No edema.     Left lower leg: No edema.     Comments: Upper extremities are nontender with good ROM.  Minor tenderness of right shin.   Neurological:     General: No focal deficit present.     Mental Status: She is alert.   Psychiatric:        Mood and Affect: Mood normal.        Behavior: Behavior normal.        Thought Content: Thought content normal.        Judgment: Judgment normal.      No results found for any visits on 01/27/24.    The ASCVD Risk score (Arnett DK, et al., 2019) failed to calculate for the following reasons:   Risk score cannot be calculated because patient has a medical history suggesting prior/existing ASCVD    Assessment & Plan:  Neck strain, initial encounter Assessment & Plan: Reassuring clinical appearance.  I don't think she needs X-rays today, but I will follow her closely with home health and home PT.  Plan to see her back in a few days.  Will also check on her tomorrow to be sure she remains stable.   Transportation insecurity -     AMB Referral VBCI  Care Management  Cigarette smoker  History of falling -     Ambulatory referral to Home Health  Depression, unspecified depression type -     Escitalopram  Oxalate; Take 0.5 tablets (10 mg total) by mouth every morning.  Dispense: 30 tablet; Refill: 0  Abnormal gait -     Ambulatory referral to Home Health    Return in about 1 week (around 02/03/2024) for chronic follow-up.    REDDING PONCE NORLEEN FALCON., MD

## 2024-01-27 NOTE — Assessment & Plan Note (Signed)
 Reassuring clinical appearance.  I don't think she needs X-rays today, but I will follow her closely with home health and home PT.  Plan to see her back in a few days.  Will also check on her tomorrow to be sure she remains stable.

## 2024-01-28 ENCOUNTER — Encounter (HOSPITAL_BASED_OUTPATIENT_CLINIC_OR_DEPARTMENT_OTHER): Payer: Self-pay | Admitting: Family Medicine

## 2024-01-28 ENCOUNTER — Telehealth (HOSPITAL_BASED_OUTPATIENT_CLINIC_OR_DEPARTMENT_OTHER): Payer: Self-pay | Admitting: *Deleted

## 2024-01-28 DIAGNOSIS — R269 Unspecified abnormalities of gait and mobility: Secondary | ICD-10-CM | POA: Insufficient documentation

## 2024-01-28 NOTE — Telephone Encounter (Signed)
 Completed telephone call.

## 2024-01-28 NOTE — Telephone Encounter (Signed)
 Patient called in to speak with the office after being woken up by an officer in regard to a welfare check. Called CAL x2, please call patient back at your soonest convenience as she is confused about the welfare check,

## 2024-02-01 ENCOUNTER — Ambulatory Visit: Admitting: Cardiology

## 2024-02-03 ENCOUNTER — Ambulatory Visit (HOSPITAL_BASED_OUTPATIENT_CLINIC_OR_DEPARTMENT_OTHER): Admitting: Family Medicine

## 2024-02-03 ENCOUNTER — Encounter (HOSPITAL_BASED_OUTPATIENT_CLINIC_OR_DEPARTMENT_OTHER): Payer: Self-pay | Admitting: *Deleted

## 2024-02-03 ENCOUNTER — Encounter (HOSPITAL_BASED_OUTPATIENT_CLINIC_OR_DEPARTMENT_OTHER): Payer: Self-pay | Admitting: Family Medicine

## 2024-02-03 VITALS — BP 156/85 | HR 78 | Ht 60.0 in | Wt 159.0 lb

## 2024-02-03 DIAGNOSIS — E1140 Type 2 diabetes mellitus with diabetic neuropathy, unspecified: Secondary | ICD-10-CM

## 2024-02-03 DIAGNOSIS — R5382 Chronic fatigue, unspecified: Secondary | ICD-10-CM | POA: Diagnosis not present

## 2024-02-03 DIAGNOSIS — I2510 Atherosclerotic heart disease of native coronary artery without angina pectoris: Secondary | ICD-10-CM

## 2024-02-03 DIAGNOSIS — E785 Hyperlipidemia, unspecified: Secondary | ICD-10-CM

## 2024-02-03 DIAGNOSIS — E1149 Type 2 diabetes mellitus with other diabetic neurological complication: Secondary | ICD-10-CM | POA: Diagnosis not present

## 2024-02-03 NOTE — Assessment & Plan Note (Addendum)
 Improved.  Extended discussion today as we get caught up on her many issues.  She assures me that her home BP is well controlled.  Arrange for fasting labs soon.  Recent Mammogram reported in the past year.  Last Colonoscopy by Dr. Charlanne.

## 2024-02-03 NOTE — Progress Notes (Signed)
 Established Patient Office Visit  Subjective   Patient ID: Barbara French, female    DOB: 04-06-1956  Age: 68 y.o. MRN: [id number]  Chief Complaint  Patient presents with   Medical Management of Chronic Issues    Pt states her left leg has been bothering her for the past 3 days.    F/u as above.  Please see last note for details.  She has not seen home health yet.  No more falls since her last visit.  Ongoing chronic neck pain that is significant but stable.  Multiple previous surgeries on her spine reported.  Already f/by Dr. [facility] for her chronic pain.    Past Medical History:  Diagnosis Date   Anxiety    Asthma    Chronic back pain    f/by Dr[facility]   Chronic laryngitis    Primarily due to smoking   Cigarette smoker 05/07/2015   Unable to quit   Coronary artery disease involving native coronary artery of native heart with angina pectoris (HCC) 02/23/2017   f/by Dr. Edwyna   Depression    Diabetes mellitus due to underlying condition with unspecified complications (HCC) 05/07/2015   Dyslipidemia    Essential hypertension 02/23/2017   GERD (gastroesophageal reflux disease)    History of lung cancer    f/by [facility] [place]   History of pulmonary embolism 02/23/2017   Migraine without aura and without status migrainosus, not intractable 07/08/2022   Moderate COPD (chronic obstructive pulmonary disease) (HCC) 02/12/2016   Myocardial infarction (HCC)    Narcolepsy    Neuropathy    PAD (peripheral artery disease) (HCC) 05/08/2019   Personal history of transient ischemic attack (TIA), and cerebral infarction without residual deficits 12/12/2021   RLS (restless legs syndrome) 07/28/2016   Seizure disorder (HCC) 02/02/2022   Stable due to previous stroke f/by [facility]   Vitamin D deficiency     Outpatient Encounter Medications as of 02/03/2024  Medication Sig   ASPIRIN  81 PO Take 81 mg by mouth daily.   cilostazol (PLETAL) 50 MG tablet Take 50 mg  by mouth 2 (two) times daily.   cyclobenzaprine  (FLEXERIL ) 10 MG tablet Take 10 mg by mouth 3 (three) times daily.   docusate sodium  (COLACE) 100 MG capsule Take 100 mg by mouth daily.   escitalopram  (LEXAPRO ) 20 MG tablet Take 0.5 tablets (10 mg total) by mouth every morning.   ferrous sulfate 325 (65 FE) MG EC tablet Take 325 mg by mouth daily.   furosemide (LASIX) 20 MG tablet Take 20 mg by mouth as needed for edema or fluid.   gabapentin  (NEURONTIN ) 300 MG capsule Take 300 mg by mouth 3 (three) times daily as needed (pain).   HYDROmorphone  (DILAUDID ) 4 MG tablet Take 4 mg by mouth 4 (four) times daily as needed (pain).   KLOR-CON M10 10 MEQ tablet Take 10 mEq by mouth daily.   losartan  (COZAAR ) 50 MG tablet Take 50 mg by mouth every morning.   meclizine  (ANTIVERT ) 12.5 MG tablet Take 12.5 mg by mouth as needed for dizziness.   metFORMIN  (GLUCOPHAGE ) 500 MG tablet Take 500 mg by mouth 2 (two) times daily with a meal.   nitroGLYCERIN  (NITROSTAT ) 0.4 MG SL tablet Place 1 tablet (0.4 mg total) under the tongue every 5 (five) minutes as needed for chest pain.   pantoprazole  (PROTONIX ) 40 MG tablet Take 40 mg by mouth every morning.   rosuvastatin (CRESTOR) 20 MG tablet Take 20 mg  by mouth at bedtime.   sitaGLIPtin (JANUVIA) 100 MG tablet Take 100 mg by mouth every morning.   zonisamide (ZONEGRAN) 100 MG capsule 1 capsule in the AM & 2 capsules in the PM   acetaminophen  (TYLENOL ) 500 MG tablet Take 1,000 mg by mouth 4 (four) times daily as needed for moderate pain or headache. (Patient not taking: Reported on 01/27/2024)   albuterol  (VENTOLIN  HFA) 108 (90 Base) MCG/ACT inhaler Inhale 1-2 puffs into the lungs 4 (four) times daily as needed for wheezing or shortness of breath. (Patient not taking: Reported on 01/27/2024)   atorvastatin  (LIPITOR) 80 MG tablet Take 80 mg by mouth daily. (Patient not taking: Reported on 01/27/2024)   EPINEPHrine  0.3 mg/0.3 mL IJ SOAJ injection Inject 0.3 mg into the muscle  once as needed for anaphylaxis.   HYDROmorphone  (DILAUDID ) 8 MG tablet Take 4 mg by mouth every 8 (eight) hours as needed.   meclizine  (ANTIVERT ) 12.5 MG tablet Take 12.5 mg by mouth as needed for dizziness. (Patient not taking: Reported on 01/27/2024)   No facility-administered encounter medications on file as of 02/03/2024.    Social History   Tobacco Use   Smoking status: Every Day    Current packs/day: 0.50    Average packs/day: 0.5 packs/day for 61.5 years (30.7 ttl pk-yrs)    Types: Cigarettes    Start date: 1964    Passive exposure: Current   Smokeless tobacco: Never  Vaping Use   Vaping status: Former  Substance Use Topics   Alcohol use: Not Currently    Comment: rarely   Drug use: No      ROS    Objective:     BP (!) 156/85 (BP Location: Right Arm, Patient Position: Sitting, Cuff Size: Normal)   Pulse 78   Ht 5' (1.524 m)   Wt 159 lb (72.1 kg)   SpO2 96%   BMI 31.05 kg/m    Physical Exam Constitutional:      General: She is not in acute distress.    Appearance: Normal appearance.  HENT:     Head: Normocephalic.  Neck:     Vascular: No carotid bruit.   Cardiovascular:     Rate and Rhythm: Normal rate and regular rhythm.     Pulses: Normal pulses.     Heart sounds: Normal heart sounds.  Pulmonary:     Effort: Pulmonary effort is normal.     Breath sounds: Normal breath sounds.  Abdominal:     General: Bowel sounds are normal.     Palpations: Abdomen is soft.   Musculoskeletal:     Cervical back: Neck supple. No tenderness.     Right lower leg: No edema.     Left lower leg: No edema.   Neurological:     Mental Status: She is alert.      No results found for any visits on 02/03/24.    The ASCVD Risk score (Arnett DK, et al., 2019) failed to calculate for the following reasons:   Risk score cannot be calculated because patient has a medical history suggesting prior/existing ASCVD    Assessment & Plan:  Type 2 diabetes mellitus with  neurological complications (HCC) -     Hemoglobin A1c; Future  Atherosclerosis of native coronary artery of native heart without angina pectoris -     CBC with Differential/Platelet; Future -     Comprehensive metabolic panel with GFR; Future  Dyslipidemia -     Lipid panel; Future  Chronic fatigue -  Vitamin B12; Future  Type 2 diabetes mellitus with diabetic neuropathy, without long-term current use of insulin  (HCC) Assessment & Plan: Improved.  Extended discussion today as we get caught up on her many issues.  She assures me that her home BP is well controlled.  Arrange for fasting labs soon.  Recent Mammogram reported in the past year.  Last Colonoscopy by Dr. Charlanne.     Return in about 4 weeks (around 03/02/2024) for chronic follow-up.    REDDING PONCE NORLEEN FALCON., MD

## 2024-02-04 ENCOUNTER — Encounter (HOSPITAL_BASED_OUTPATIENT_CLINIC_OR_DEPARTMENT_OTHER): Payer: Self-pay | Admitting: Family Medicine

## 2024-02-07 ENCOUNTER — Other Ambulatory Visit (HOSPITAL_BASED_OUTPATIENT_CLINIC_OR_DEPARTMENT_OTHER)

## 2024-02-07 DIAGNOSIS — I2510 Atherosclerotic heart disease of native coronary artery without angina pectoris: Secondary | ICD-10-CM | POA: Diagnosis not present

## 2024-02-07 DIAGNOSIS — R5382 Chronic fatigue, unspecified: Secondary | ICD-10-CM

## 2024-02-07 DIAGNOSIS — E1149 Type 2 diabetes mellitus with other diabetic neurological complication: Secondary | ICD-10-CM | POA: Diagnosis not present

## 2024-02-07 DIAGNOSIS — E785 Hyperlipidemia, unspecified: Secondary | ICD-10-CM | POA: Diagnosis not present

## 2024-02-07 NOTE — Addendum Note (Signed)
 Addended by: RONNETTE DAMIEN SQUIBB on: 02/07/2024 04:39 PM   Modules accepted: Orders

## 2024-02-08 ENCOUNTER — Ambulatory Visit (HOSPITAL_BASED_OUTPATIENT_CLINIC_OR_DEPARTMENT_OTHER): Payer: Self-pay | Admitting: Family Medicine

## 2024-02-08 ENCOUNTER — Other Ambulatory Visit (HOSPITAL_BASED_OUTPATIENT_CLINIC_OR_DEPARTMENT_OTHER): Payer: Self-pay | Admitting: Family Medicine

## 2024-02-08 DIAGNOSIS — R269 Unspecified abnormalities of gait and mobility: Secondary | ICD-10-CM

## 2024-02-08 LAB — CBC WITH DIFFERENTIAL/PLATELET
Basophils Absolute: 0 10*3/uL (ref 0.0–0.2)
Basos: 0 %
EOS (ABSOLUTE): 0.3 10*3/uL (ref 0.0–0.4)
Eos: 2 %
Hematocrit: 45.1 % (ref 34.0–46.6)
Hemoglobin: 14.3 g/dL (ref 11.1–15.9)
Immature Grans (Abs): 0 10*3/uL (ref 0.0–0.1)
Immature Granulocytes: 0 %
Lymphocytes Absolute: 2.4 10*3/uL (ref 0.7–3.1)
Lymphs: 23 %
MCH: 30.8 pg (ref 26.6–33.0)
MCHC: 31.7 g/dL (ref 31.5–35.7)
MCV: 97 fL (ref 79–97)
Monocytes Absolute: 0.9 10*3/uL (ref 0.1–0.9)
Monocytes: 9 %
Neutrophils Absolute: 7 10*3/uL (ref 1.4–7.0)
Neutrophils: 66 %
Platelets: 248 10*3/uL (ref 150–450)
RBC: 4.64 x10E6/uL (ref 3.77–5.28)
RDW: 13.8 % (ref 11.7–15.4)
WBC: 10.7 10*3/uL (ref 3.4–10.8)

## 2024-02-08 LAB — COMPREHENSIVE METABOLIC PANEL WITH GFR
ALT: 12 IU/L (ref 0–32)
AST: 17 IU/L (ref 0–40)
Albumin: 3.9 g/dL (ref 3.9–4.9)
Alkaline Phosphatase: 99 IU/L (ref 44–121)
BUN/Creatinine Ratio: 14 (ref 12–28)
BUN: 11 mg/dL (ref 8–27)
Bilirubin Total: <0.2 mg/dL (ref 0.0–1.2)
CO2: 16 mmol/L — ABNORMAL LOW (ref 20–29)
Calcium: 9 mg/dL (ref 8.7–10.3)
Chloride: 97 mmol/L (ref 96–106)
Creatinine, Ser: 0.77 mg/dL (ref 0.57–1.00)
Globulin, Total: 2.9 g/dL (ref 1.5–4.5)
Glucose: 99 mg/dL (ref 70–99)
Potassium: 4.5 mmol/L (ref 3.5–5.2)
Sodium: 131 mmol/L — ABNORMAL LOW (ref 134–144)
Total Protein: 6.8 g/dL (ref 6.0–8.5)
eGFR: 85 mL/min/{1.73_m2} (ref >59)

## 2024-02-08 LAB — LIPID PANEL
Chol/HDL Ratio: 3.2 ratio (ref 0.0–4.4)
Cholesterol, Total: 199 mg/dL (ref 100–199)
HDL: 62 mg/dL (ref >39)
LDL Chol Calc (NIH): 123 mg/dL — ABNORMAL HIGH (ref 0–99)
Triglycerides: 78 mg/dL (ref 0–149)
VLDL Cholesterol Cal: 14 mg/dL (ref 5–40)

## 2024-02-08 LAB — HEMOGLOBIN A1C
Est. average glucose Bld gHb Est-mCnc: 143 mg/dL
Hgb A1c MFr Bld: 6.6 % — ABNORMAL HIGH (ref 4.8–5.6)

## 2024-02-08 LAB — VITAMIN B12: Vitamin B-12: 336 pg/mL (ref 232–1245)

## 2024-02-10 ENCOUNTER — Encounter (HOSPITAL_BASED_OUTPATIENT_CLINIC_OR_DEPARTMENT_OTHER): Payer: Self-pay

## 2024-02-10 ENCOUNTER — Ambulatory Visit (HOSPITAL_BASED_OUTPATIENT_CLINIC_OR_DEPARTMENT_OTHER): Admitting: Family Medicine

## 2024-02-10 ENCOUNTER — Telehealth: Payer: Self-pay

## 2024-02-10 ENCOUNTER — Ambulatory Visit (HOSPITAL_BASED_OUTPATIENT_CLINIC_OR_DEPARTMENT_OTHER): Admitting: *Deleted

## 2024-02-10 DIAGNOSIS — R269 Unspecified abnormalities of gait and mobility: Secondary | ICD-10-CM

## 2024-02-10 DIAGNOSIS — D510 Vitamin B12 deficiency anemia due to intrinsic factor deficiency: Secondary | ICD-10-CM | POA: Diagnosis not present

## 2024-02-10 MED ORDER — CYANOCOBALAMIN 1000 MCG/ML IJ SOLN
1000.0000 ug | Freq: Once | INTRAMUSCULAR | Status: AC
Start: 2024-02-10 — End: 2024-02-10
  Administered 2024-02-10: 1000 ug via INTRAMUSCULAR

## 2024-02-10 NOTE — Progress Notes (Signed)
 Complex Care Management Note Care Guide Note  02/10/2024 Name: Barbara French MRN: [id number] DOB: [birth year]   Complex Care Management Outreach Attempts: An unsuccessful telephone outreach was attempted today to offer the patient information about available complex care management services.  Follow Up Plan:  Additional outreach attempts will be made to offer the patient complex care management information and services.   Encounter Outcome:  No Answer  D[facility]  [facility], [facility] Care Management Assistant Direct Dial: [phone]  Fax: [phone]

## 2024-02-14 ENCOUNTER — Ambulatory Visit (HOSPITAL_BASED_OUTPATIENT_CLINIC_OR_DEPARTMENT_OTHER)

## 2024-02-14 NOTE — Progress Notes (Signed)
 Established Patient Office Visit  Subjective   Patient ID: Barbara French, female    DOB: 1956-03-02  Age: 68 y.o. MRN: [id number]  No chief complaint on file.   PATIENT'S ISSUES SETTLED WITHOUT THE DOCTOR NEEDING TO SEE HER.    Past Medical History:  Diagnosis Date   Anxiety    Asthma    Chronic back pain    f/by [facility]   Chronic laryngitis    Primarily due to smoking   Cigarette smoker 05/07/2015   Unable to quit   Coronary artery disease involving native coronary artery of native heart with angina pectoris (HCC) 02/23/2017   f/by Dr. Edwyna   Depression    Diabetes mellitus due to underlying condition with unspecified complications (HCC) 05/07/2015   Dyslipidemia    Essential hypertension 02/23/2017   GERD (gastroesophageal reflux disease)    History of lung cancer    f/by [facility] [place]   History of pulmonary embolism 02/23/2017   Migraine without aura and without status migrainosus, not intractable 07/08/2022   Moderate COPD (chronic obstructive pulmonary disease) (HCC) 02/12/2016   Myocardial infarction (HCC)    Narcolepsy    Neuropathy    PAD (peripheral artery disease) (HCC) 05/08/2019   Personal history of transient ischemic attack (TIA), and cerebral infarction without residual deficits 12/12/2021   RLS (restless legs syndrome) 07/28/2016   Seizure disorder (HCC) 02/02/2022   Stable due to previous stroke f/by [facility]   Vitamin D deficiency     Outpatient Encounter Medications as of 02/10/2024  Medication Sig   acetaminophen  (TYLENOL ) 500 MG tablet Take 1,000 mg by mouth 4 (four) times daily as needed for moderate pain or headache. (Patient not taking: Reported on 01/27/2024)   albuterol  (VENTOLIN  HFA) 108 (90 Base) MCG/ACT inhaler Inhale 1-2 puffs into the lungs 4 (four) times daily as needed for wheezing or shortness of breath. (Patient not taking: Reported on 01/27/2024)   ASPIRIN  81 PO Take 81 mg by mouth daily.   atorvastatin   (LIPITOR) 80 MG tablet Take 80 mg by mouth daily. (Patient not taking: Reported on 01/27/2024)   cilostazol (PLETAL) 50 MG tablet Take 50 mg by mouth 2 (two) times daily.   cyclobenzaprine  (FLEXERIL ) 10 MG tablet Take 10 mg by mouth 3 (three) times daily.   docusate sodium  (COLACE) 100 MG capsule Take 100 mg by mouth daily.   EPINEPHrine  0.3 mg/0.3 mL IJ SOAJ injection Inject 0.3 mg into the muscle once as needed for anaphylaxis.   escitalopram  (LEXAPRO ) 20 MG tablet Take 0.5 tablets (10 mg total) by mouth every morning.   ferrous sulfate 325 (65 FE) MG EC tablet Take 325 mg by mouth daily.   furosemide (LASIX) 20 MG tablet Take 20 mg by mouth as needed for edema or fluid.   gabapentin  (NEURONTIN ) 300 MG capsule Take 300 mg by mouth 3 (three) times daily as needed (pain).   HYDROmorphone  (DILAUDID ) 4 MG tablet Take 4 mg by mouth 4 (four) times daily as needed (pain).   HYDROmorphone  (DILAUDID ) 8 MG tablet Take 4 mg by mouth every 8 (eight) hours as needed.   KLOR-CON M10 10 MEQ tablet Take 10 mEq by mouth daily.   losartan  (COZAAR ) 50 MG tablet Take 50 mg by mouth every morning.   meclizine  (ANTIVERT ) 12.5 MG tablet Take 12.5 mg by mouth as needed for dizziness. (Patient not taking: Reported on 01/27/2024)   meclizine  (ANTIVERT ) 12.5 MG tablet Take 12.5 mg by mouth as  needed for dizziness.   metFORMIN  (GLUCOPHAGE ) 500 MG tablet Take 500 mg by mouth 2 (two) times daily with a meal.   nitroGLYCERIN  (NITROSTAT ) 0.4 MG SL tablet Place 1 tablet (0.4 mg total) under the tongue every 5 (five) minutes as needed for chest pain.   pantoprazole  (PROTONIX ) 40 MG tablet Take 40 mg by mouth every morning.   rosuvastatin (CRESTOR) 20 MG tablet Take 20 mg by mouth at bedtime.   sitaGLIPtin (JANUVIA) 100 MG tablet Take 100 mg by mouth every morning.   zonisamide (ZONEGRAN) 100 MG capsule 1 capsule in the AM & 2 capsules in the PM   [EXPIRED] cyanocobalamin  (VITAMIN B12) injection 1,000 mcg    No  facility-administered encounter medications on file as of 02/10/2024.    Social History   Tobacco Use   Smoking status: Every Day    Current packs/day: 0.50    Average packs/day: 0.5 packs/day for 61.5 years (30.8 ttl pk-yrs)    Types: Cigarettes    Start date: 1964    Passive exposure: Current   Smokeless tobacco: Never  Vaping Use   Vaping status: Former  Substance Use Topics   Alcohol use: Not Currently    Comment: rarely   Drug use: No      ROS    Objective:     There were no vitals taken for this visit.   Physical Exam   No results found for any visits on 02/10/24.    The ASCVD Risk score (Arnett DK, et al., 2019) failed to calculate for the following reasons:   Risk score cannot be calculated because patient has a medical history suggesting prior/existing ASCVD    Assessment & Plan:  Pernicious anemia -     Cyanocobalamin   Abnormality of gait and mobility -     Ambulatory referral to Physical Therapy    No follow-ups on file.    REDDING PONCE NORLEEN FALCON., MD

## 2024-02-17 NOTE — Progress Notes (Signed)
 Complex Care Management Note  Care Guide Note 02/17/2024 Name: ALTAGRACIA RONE MRN: [id number] DOB: 28-Oct-1955  Wilbert LITTIE Bihari is a 68 y.o. year old female who sees [place], Norleen PHEBE HEATH, MD for primary care. I reached out to Wilbert LITTIE Bihari by phone today to offer complex care management services.  Ms. Walgren was given information about Complex Care Management services today including:   The Complex Care Management services include support from the care team which includes your Nurse Care Manager, Clinical Social Worker, or Pharmacist.  The Complex Care Management team is here to help remove barriers to the health concerns and goals most important to you. Complex Care Management services are voluntary, and the patient may decline or stop services at any time by request to their care team member.   Complex Care Management Consent Status: Patient agreed to services and verbal consent obtained.   Follow up plan:  Telephone appointment with complex care management team member scheduled for:  02/22/24 at 1:00 p.m.   Encounter Outcome:  Patient Scheduled  Dreama [facility], [facility] Care Management Assistant Direct Dial: [phone]  Fax: [phone]

## 2024-02-21 ENCOUNTER — Other Ambulatory Visit: Payer: Self-pay

## 2024-02-21 DIAGNOSIS — G8929 Other chronic pain: Secondary | ICD-10-CM | POA: Insufficient documentation

## 2024-02-21 DIAGNOSIS — Z85118 Personal history of other malignant neoplasm of bronchus and lung: Secondary | ICD-10-CM | POA: Insufficient documentation

## 2024-02-22 ENCOUNTER — Encounter (HOSPITAL_COMMUNITY): Payer: Self-pay

## 2024-02-22 ENCOUNTER — Emergency Department (HOSPITAL_COMMUNITY)

## 2024-02-22 ENCOUNTER — Emergency Department (HOSPITAL_COMMUNITY)
Admission: EM | Admit: 2024-02-22 | Discharge: 2024-02-22 | Disposition: A | Discharge status: Home / Self Care | Attending: Emergency Medicine | Admitting: Emergency Medicine

## 2024-02-22 ENCOUNTER — Other Ambulatory Visit: Payer: Self-pay

## 2024-02-22 DIAGNOSIS — S3992XA Unspecified injury of lower back, initial encounter: Secondary | ICD-10-CM | POA: Diagnosis not present

## 2024-02-22 DIAGNOSIS — Z7984 Long term (current) use of oral hypoglycemic drugs: Secondary | ICD-10-CM | POA: Diagnosis not present

## 2024-02-22 DIAGNOSIS — M545 Low back pain, unspecified: Secondary | ICD-10-CM | POA: Diagnosis not present

## 2024-02-22 DIAGNOSIS — M542 Cervicalgia: Secondary | ICD-10-CM | POA: Diagnosis not present

## 2024-02-22 DIAGNOSIS — S0990XA Unspecified injury of head, initial encounter: Secondary | ICD-10-CM | POA: Diagnosis not present

## 2024-02-22 DIAGNOSIS — R609 Edema, unspecified: Secondary | ICD-10-CM | POA: Diagnosis not present

## 2024-02-22 DIAGNOSIS — D72829 Elevated white blood cell count, unspecified: Secondary | ICD-10-CM | POA: Insufficient documentation

## 2024-02-22 DIAGNOSIS — G894 Chronic pain syndrome: Secondary | ICD-10-CM | POA: Diagnosis not present

## 2024-02-22 DIAGNOSIS — E119 Type 2 diabetes mellitus without complications: Secondary | ICD-10-CM | POA: Diagnosis not present

## 2024-02-22 DIAGNOSIS — Z8673 Personal history of transient ischemic attack (TIA), and cerebral infarction without residual deficits: Secondary | ICD-10-CM | POA: Insufficient documentation

## 2024-02-22 DIAGNOSIS — Z91010 Allergy to peanuts: Secondary | ICD-10-CM | POA: Diagnosis not present

## 2024-02-22 DIAGNOSIS — Z96651 Presence of right artificial knee joint: Secondary | ICD-10-CM | POA: Diagnosis not present

## 2024-02-22 DIAGNOSIS — Y92410 Unspecified street and highway as the place of occurrence of the external cause: Secondary | ICD-10-CM | POA: Insufficient documentation

## 2024-02-22 DIAGNOSIS — M47817 Spondylosis without myelopathy or radiculopathy, lumbosacral region: Secondary | ICD-10-CM | POA: Diagnosis not present

## 2024-02-22 DIAGNOSIS — M25561 Pain in right knee: Secondary | ICD-10-CM | POA: Insufficient documentation

## 2024-02-22 DIAGNOSIS — G9389 Other specified disorders of brain: Secondary | ICD-10-CM | POA: Diagnosis not present

## 2024-02-22 DIAGNOSIS — I2510 Atherosclerotic heart disease of native coronary artery without angina pectoris: Secondary | ICD-10-CM | POA: Insufficient documentation

## 2024-02-22 DIAGNOSIS — M549 Dorsalgia, unspecified: Secondary | ICD-10-CM | POA: Diagnosis not present

## 2024-02-22 DIAGNOSIS — Z041 Encounter for examination and observation following transport accident: Secondary | ICD-10-CM | POA: Diagnosis not present

## 2024-02-22 DIAGNOSIS — Z7982 Long term (current) use of aspirin: Secondary | ICD-10-CM | POA: Diagnosis not present

## 2024-02-22 DIAGNOSIS — Z981 Arthrodesis status: Secondary | ICD-10-CM | POA: Diagnosis not present

## 2024-02-22 DIAGNOSIS — M48061 Spinal stenosis, lumbar region without neurogenic claudication: Secondary | ICD-10-CM | POA: Diagnosis not present

## 2024-02-22 LAB — CBC WITH DIFFERENTIAL/PLATELET
Abs Immature Granulocytes: 0.07 K/uL (ref 0.00–0.07)
Basophils Absolute: 0 K/uL (ref 0.0–0.1)
Basophils Relative: 0 %
Eosinophils Absolute: 0.2 K/uL (ref 0.0–0.5)
Eosinophils Relative: 2 %
HCT: 42.5 % (ref 36.0–46.0)
Hemoglobin: 14 g/dL (ref 12.0–15.0)
Immature Granulocytes: 1 %
Lymphocytes Relative: 14 %
Lymphs Abs: 1.9 K/uL (ref 0.7–4.0)
MCH: 30.8 pg (ref 26.0–34.0)
MCHC: 32.9 g/dL (ref 30.0–36.0)
MCV: 93.6 fL (ref 80.0–100.0)
Monocytes Absolute: 1.2 K/uL — ABNORMAL HIGH (ref 0.1–1.0)
Monocytes Relative: 9 %
Neutro Abs: 9.6 K/uL — ABNORMAL HIGH (ref 1.7–7.7)
Neutrophils Relative %: 74 %
Platelets: 280 K/uL (ref 150–400)
RBC: 4.54 MIL/uL (ref 3.87–5.11)
RDW: 14.6 % (ref 11.5–15.5)
WBC: 12.9 K/uL — ABNORMAL HIGH (ref 4.0–10.5)
nRBC: 0 % (ref 0.0–0.2)

## 2024-02-22 LAB — COMPREHENSIVE METABOLIC PANEL WITH GFR
ALT: 12 U/L (ref 0–44)
AST: 20 U/L (ref 15–41)
Albumin: 3.4 g/dL — ABNORMAL LOW (ref 3.5–5.0)
Alkaline Phosphatase: 85 U/L (ref 38–126)
Anion gap: 10 (ref 5–15)
BUN: 11 mg/dL (ref 8–23)
CO2: 24 mmol/L (ref 22–32)
Calcium: 9 mg/dL (ref 8.9–10.3)
Chloride: 101 mmol/L (ref 98–111)
Creatinine, Ser: 0.72 mg/dL (ref 0.44–1.00)
GFR, Estimated: >60 mL/min (ref >60)
Glucose, Bld: 93 mg/dL (ref 70–99)
Potassium: 4.1 mmol/L (ref 3.5–5.1)
Sodium: 135 mmol/L (ref 135–145)
Total Bilirubin: 0.4 mg/dL (ref 0.0–1.2)
Total Protein: 7.1 g/dL (ref 6.5–8.1)

## 2024-02-22 MED ORDER — HYDROMORPHONE HCL 1 MG/ML IJ SOLN
2.0000 mg | Freq: Once | INTRAMUSCULAR | Status: AC
  Administered 2024-02-22: 2 mg via INTRAVENOUS
  Filled 2024-02-22: qty 2

## 2024-02-22 MED ORDER — HYDROMORPHONE HCL 1 MG/ML IJ SOLN: 1.0000 mg | Freq: Once | INTRAMUSCULAR | Status: DC

## 2024-02-22 NOTE — ED Notes (Signed)
 When getting pt vitals O2 was reading at 70% room air. O2 sensor moved from finger to forehead still reading 70%. Pt put on nasal canula 6L

## 2024-02-22 NOTE — ED Notes (Signed)
 Patient transported to CT
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
## 2024-02-22 NOTE — ED Notes (Addendum)
 Pt O2 at 94 on 3L, RN notified
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
## 2024-02-23 ENCOUNTER — Encounter: Payer: Self-pay | Admitting: Cardiology

## 2024-02-23 ENCOUNTER — Ambulatory Visit: Attending: Cardiology | Admitting: Cardiology

## 2024-02-23 VITALS — BP 112/60 | HR 104 | Ht 61.6 in | Wt 164.6 lb

## 2024-02-23 DIAGNOSIS — Z86711 Personal history of pulmonary embolism: Secondary | ICD-10-CM | POA: Diagnosis not present

## 2024-02-23 DIAGNOSIS — I10 Essential (primary) hypertension: Secondary | ICD-10-CM | POA: Diagnosis not present

## 2024-02-23 DIAGNOSIS — F17210 Nicotine dependence, cigarettes, uncomplicated: Secondary | ICD-10-CM

## 2024-02-23 DIAGNOSIS — I2510 Atherosclerotic heart disease of native coronary artery without angina pectoris: Secondary | ICD-10-CM

## 2024-02-23 NOTE — Patient Instructions (Signed)
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
## 2024-02-28 ENCOUNTER — Telehealth: Payer: Self-pay | Admitting: Cardiology

## 2024-02-28 ENCOUNTER — Encounter: Payer: Self-pay | Admitting: Cardiology
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
## 2024-02-28 NOTE — Telephone Encounter (Signed)
 Spoke with Barbara French and advised that her mom was seen for a routine FU. She denied chest pain, shortness of breath or any cardiac sx. Advised to go to [facility] and sign a release of records so that they could get a copy. Advised that Dr. Edwyna does not typically sign death certificates. Barbara French did not have any additional questions.

## 2024-02-28 NOTE — Telephone Encounter (Signed)
 Error

## 2024-03-01 ENCOUNTER — Telehealth: Payer: Self-pay

## 2024-03-01 NOTE — Progress Notes (Signed)
  Complex Care Management Note Care Guide Note  03/01/2024 Name: Barbara French MRN: [id number] DOB: 1956/07/23   Complex Care Management Outreach Attempts: An unsuccessful telephone outreach was attempted today to offer the patient information about available complex care management services.  Follow Up Plan:  No further outreach attempts will be made at this time. We have been unable to contact the patient to offer or enroll patient in complex care management services.  Encounter Outcome:  No Answer  D[facility]  [facility] Care Management Assistant Direct Dial: [phone]  Fax: [phone]

## 2024-03-02 ENCOUNTER — Ambulatory Visit (HOSPITAL_BASED_OUTPATIENT_CLINIC_OR_DEPARTMENT_OTHER): Admitting: Family Medicine

## 2024-03-10 DEATH — deceased
